# Patient Record
Sex: Female | Born: 1995 | Race: Black or African American | Hispanic: No | Marital: Single | Smoking: Never smoker
Health system: Southern US, Community
[De-identification: ages and names within clinical notes are randomized; demographics above are authoritative.]

## PROBLEM LIST (undated history)

## (undated) DIAGNOSIS — O24419 Gestational diabetes mellitus in pregnancy, unspecified control: Secondary | ICD-10-CM

## (undated) DIAGNOSIS — R519 Headache, unspecified: Secondary | ICD-10-CM

## (undated) DIAGNOSIS — J45909 Unspecified asthma, uncomplicated: Secondary | ICD-10-CM

## (undated) DIAGNOSIS — O139 Gestational [pregnancy-induced] hypertension without significant proteinuria, unspecified trimester: Secondary | ICD-10-CM

## (undated) DIAGNOSIS — F419 Anxiety disorder, unspecified: Secondary | ICD-10-CM

## (undated) DIAGNOSIS — L719 Rosacea, unspecified: Secondary | ICD-10-CM

## (undated) DIAGNOSIS — E282 Polycystic ovarian syndrome: Secondary | ICD-10-CM

## (undated) DIAGNOSIS — E119 Type 2 diabetes mellitus without complications: Secondary | ICD-10-CM

## (undated) HISTORY — DX: Headache, unspecified: R51.9

## (undated) HISTORY — DX: Anxiety disorder, unspecified: F41.9

## (undated) HISTORY — DX: Gestational diabetes mellitus in pregnancy, unspecified control: O24.419

## (undated) HISTORY — PX: DRUG INDUCED ENDOSCOPY: SHX6808

---
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
## 2014-03-07 ENCOUNTER — Inpatient Hospital Stay: Admit: 2014-03-07 | Discharge: 2014-03-07 | Disposition: A | Discharge status: Home / Self Care

## 2014-03-07 ENCOUNTER — Emergency Department: Admit: 2014-03-07

## 2014-03-07 DIAGNOSIS — N10 Acute tubulo-interstitial nephritis: Secondary | ICD-10-CM

## 2014-03-07 LAB — CBC WITH AUTO DIFFERENTIAL
Basophils Absolute: 0 10*3/uL (ref 0.0–0.1)
Basophils: 0.4 %
Eosinophils Absolute: 0.2 10*3/uL (ref 0.0–0.4)
Eosinophils: 1.4 %
Hematocrit: 38 % (ref 37.0–47.0)
Hemoglobin: 11.9 gm/dl — ABNORMAL LOW (ref 12.0–16.0)
Lymphocytes Absolute: 2.8 10*3/uL (ref 1.0–4.8)
Lymphocytes: 23.7 %
MCH: 25.6 pg — ABNORMAL LOW (ref 27.0–31.0)
MCHC: 31.3 gm/dl — ABNORMAL LOW (ref 33.0–37.0)
MCV: 81.6 fL (ref 81.0–99.0)
MPV: 7.5 mcm (ref 7.4–10.4)
Monocytes Absolute: 0.6 10*3/uL (ref 0.4–1.3)
Monocytes: 5.4 %
Platelets: 314 10*3/uL (ref 130–400)
RBC Morphology: NORMAL
RBC: 4.65 10*6/uL (ref 4.20–5.40)
RDW: 14.3 % (ref 11.5–14.5)
Seg Neutrophils: 69.1 %
Segs Absolute: 8.2 10*3/uL — ABNORMAL HIGH (ref 1.8–7.7)
WBC: 11.8 10*3/uL — ABNORMAL HIGH (ref 4.8–10.8)
nRBC: 0 /100 wbc

## 2014-03-07 LAB — URINE WITH REFLEXED MICRO
Bilirubin Urine: NEGATIVE
CASTS 2: NONE SEEN /lpf
Crystals, UA: NONE SEEN
Glucose, Ur: NEGATIVE mg/dl
Ketones, Urine: NEGATIVE
MISCELLANEOUS 2: NONE SEEN
Nitrite, Urine: POSITIVE — AB
Protein, UA: 100 — AB
Renal Epithelial, UA: NONE SEEN
Specific Gravity, Urine: 1.018 (ref 1.002–1.03)
Urobilinogen, Urine: 1 eu/dl (ref 0.0–1.0)
WBC, UA: >200 /hpf (ref 0–?)
Yeast, UA: NONE SEEN
pH, UA: 6 (ref 5.0–9.0)

## 2014-03-07 LAB — BASIC METABOLIC PANEL
BUN: 11 mg/dl (ref 7–22)
CO2: 24 meq/l (ref 23–33)
Calcium: 9.6 mg/dl (ref 8.5–10.5)
Chloride: 100 meq/l (ref 98–111)
Creatinine: 0.7 mg/dl (ref 0.4–1.2)
Glucose: 91 mg/dl (ref 70–108)
Potassium: 3.8 meq/l (ref 3.5–5.2)
Sodium: 139 meq/l (ref 135–145)

## 2014-03-07 LAB — OSMOLALITY: Osmolality Calc: 276.5 mOsmol/kg (ref 275.0–300)

## 2014-03-07 LAB — GLOMERULAR FILTRATION RATE, ESTIMATED: Est, Glom Filt Rate: >90 mL/min/{1.73_m2}

## 2014-03-07 LAB — PREGNANCY, URINE: Pregnancy, Urine: NEGATIVE

## 2014-03-07 LAB — ANION GAP: Anion Gap: 18.8 (ref 10.0–20.0)

## 2014-03-07 MED ORDER — ONDANSETRON 4 MG PO TBDP
4 MG | ORAL | Status: DC
Start: 2014-03-07 — End: 2014-03-07

## 2014-03-07 MED ORDER — CIPROFLOXACIN HCL 500 MG PO TABS
500 MG | ORAL_TABLET | Freq: Two times a day (BID) | ORAL | Status: AC
Start: 2014-03-07 — End: 2014-03-14

## 2014-03-07 MED ORDER — HYDROCODONE-ACETAMINOPHEN 5-325 MG PO TABS
5-325 MG | ORAL_TABLET | Freq: Four times a day (QID) | ORAL | Status: DC | PRN
Start: 2014-03-07 — End: 2014-05-07

## 2014-03-07 MED ORDER — KETOROLAC TROMETHAMINE 10 MG PO TABS
10 MG | ORAL_TABLET | Freq: Four times a day (QID) | ORAL | Status: DC | PRN
Start: 2014-03-07 — End: 2014-05-07

## 2014-03-07 MED ORDER — KETOROLAC TROMETHAMINE 60 MG/2ML IJ SOLN
60 MG/2ML | INTRAMUSCULAR | Status: DC
Start: 2014-03-07 — End: 2014-03-07

## 2014-03-07 MED ORDER — ONDANSETRON 4 MG PO TBDP
4 MG | Freq: Once | ORAL | Status: AC
Start: 2014-03-07 — End: 2014-03-07
  Administered 2014-03-07: 07:00:00 4 mg via ORAL

## 2014-03-07 MED ORDER — KETOROLAC TROMETHAMINE 60 MG/2ML IJ SOLN
60 MG/2ML | Freq: Once | INTRAMUSCULAR | Status: AC
Start: 2014-03-07 — End: 2014-03-07
  Administered 2014-03-07: 07:00:00 60 mg via INTRAMUSCULAR

## 2014-03-07 MED FILL — KETOROLAC TROMETHAMINE 60 MG/2ML IJ SOLN: 60 MG/2ML | INTRAMUSCULAR | Qty: 2

## 2014-03-07 MED FILL — ONDANSETRON 4 MG PO TBDP: 4 MG | ORAL | Qty: 1

## 2014-03-07 NOTE — Discharge Instructions (Signed)
Kidney Infection: Care Instructions  Your Care Instructions  A kidney infection (pyelonephritis) is a type of urinary tract infection, or UTI. Most UTIs are bladder infections. Kidney infections tend to make people much sicker than bladder infections do. A kidney infection is also more serious because it can cause lasting damage if it is not treated quickly.  Follow-up care is a key part of your treatment and safety. Be sure to make and go to all appointments, and call your doctor if you are having problems. It's also a good idea to know your test results and keep a list of the medicines you take.  How can you care for yourself at home?   Take your antibiotics as directed. Do not stop taking them just because you feel better. You need to take the full course of antibiotics.   Drink plenty of water, enough so that your urine is light yellow or clear like water. This may help wash out bacteria that are causing the infection. If you have kidney, heart, or liver disease and have to limit fluids, talk with your doctor before you increase the amount of fluids you drink.   Urinate often. Try to empty your bladder each time.   To relieve pain, take a hot shower or lay a heating pad (set on low) over your lower belly. Never go to sleep with a heating pad in place. Put a thin cloth between the heating pad and your skin.  To help prevent kidney infections    Drink plenty of water each day. This helps you urinate often, which clears bacteria from your system. If you have kidney, heart, or liver disease and have to limit fluids, talk with your doctor before you increase the amount of fluids you drink.   Include cranberry juice in your diet.   Urinate when you have the urge. Do not hold your urine for a long time. Urinate before you go to sleep.   If you have symptoms of a bladder infection, such as burning when you urinate or having to urinate often, call your doctor so you can treat the problem before it gets worse.  If you do not treat a bladder infection quickly, it can spread to the kidney.   Men should keep the tip of the penis clean.  If you are a woman, keep these ideas in mind:   Urinate right after you have sex.   Change sanitary pads often. Avoid douches, feminine hygiene sprays, and other feminine hygiene products that have deodorants.   After going to the bathroom, wipe from front to back.  When should you call for help?  Call your doctor now or seek immediate medical care if:   You have increasing pain in your back just below the rib cage. This is called flank pain.   You have a new or higher fever and chills.   You are vomiting or nauseated.  Watch closely for changes in your health, and be sure to contact your doctor if:   Symptoms, such as burning when you urinate, get worse or get better but then come back.   You are not getting better after 2 days.   Where can you learn more?   Go to [email or web address] and sign in to your MyChart account. Enter E877 in the Search Health Information box to learn more about "Kidney Infection: Care Instructions."    If you do not have an account, please click on the "Sign Up Now" link.        2006-2015 [employer]. Care instructions adapted under license by [facility]. This care instruction is for use with your licensed healthcare professional. If you have questions about a medical condition or this instruction, always ask your healthcare professional. [employer] disclaims any warranty or liability for your use of this information.  Content Version: 10.6.465758; Current as of: February 27, 2013

## 2014-03-07 NOTE — ED Notes (Signed)
Patient to ED for dysuria. Patient states [place] was [place] in [place] and came to [place] a couple days ago. Patient reports that symptoms have not improved since being on antibiotics. Patient can not recall what antibiotic she was on and left the prescription at home. Patient states that pain is now in her abdomin radiating to her right flank. Last time patient has this pain she was diagnosed with a kidney infection.    Doree Albeeanner D Norman Piacentini, RN  03/07/14 [phone]

## 2014-03-07 NOTE — ED Provider Notes (Signed)
[facility]  eMERGENCY dEPARTMENT eNCOUnter          CHIEF COMPLAINT       Chief Complaint   Patient presents with   ??? Dysuria   ??? Flank Pain     right       Nurses Notes were reviewed and I agree except as noted in the HPI.      HISTORY OF PRESENT ILLNESS    Yvonne Vang is a 18 y.o. female who presents with dysuria and right flank pain.  The patient states that she began to develop dysuria and right flank pain 2 days ago, no known trigger or injury.  Patient states the pain as a burning sensation when she urinates, with mild flank pain that has been present over the past 24 hours.  Patient states that she is largely asymptomatic when she is not urinating.  The patient denies nausea, vomiting, fevers, chills, rashes, pharyngitis, lightheadedness, dizziness, or episodes of syncope.  The patient denies vaginal discharge, vaginal bleeding, changes in bowel movements.  She attempted to treat her symptoms with an over-the-counter medication for urinary tract infections, and stated symptoms initially resolved, but have worsened.  She denies any additional symptoms at this point.     Significant past medical history: None    Onset: 2 days ago, with no known trigger or injury  Location: Lower abdomen, right flank  Duration: Constant  Character: Burning, dull ache in flank  Aggravating/Relieving Factors: Unable to describe aggravating or relieving symptoms  Previous Treatment: Over-the-counter urinary tract infection medication  Associated Symptoms: None     REVIEW OF SYSTEMS     Review of Systems   Constitutional: Negative for fever, chills and fatigue.   HENT: Negative for congestion, ear pain, postnasal drip, rhinorrhea and sore throat.    Eyes: Negative for pain and visual disturbance.   Respiratory: Negative for cough, chest tightness, shortness of breath and wheezing.    Cardiovascular: Negative for chest pain, palpitations and leg swelling.   Gastrointestinal: Negative for nausea, vomiting,  abdominal pain, diarrhea and constipation.   Genitourinary: Positive for dysuria, flank pain and pelvic pain. Negative for frequency, hematuria, decreased urine volume, vaginal bleeding, vaginal discharge and vaginal pain.   Musculoskeletal: Negative for joint swelling, arthralgias and gait problem.   Skin: Negative for rash and wound.   Allergic/Immunologic: Negative for immunocompromised state.   Neurological: Negative for dizziness, syncope, weakness, light-headedness, numbness and headaches.   Hematological: Negative for adenopathy.   Psychiatric/Behavioral: Negative for confusion.        PAST MEDICAL HISTORY    has a past medical history of Pneumonia.    SURGICAL HISTORY      has no past surgical history on file.    CURRENT MEDICATIONS       Discharge Medication List as of 03/07/2014  4:21 AM          ALLERGIES     has No Known Allergies.    FAMILY HISTORY     has no family status information on file.  family history is not on file.    SOCIAL HISTORY      reports that she has never smoked. She has never used smokeless tobacco. She reports that she does not drink alcohol or use illicit drugs.    PHYSICAL EXAM     INITIAL VITALS:  oral temperature is 97.9 ??F (36.6 ??C). Her blood pressure is 127/71 and her pulse is 94. Her respiration is 20 and oxygen  saturation is 98%.    Physical Exam   Constitutional: She is oriented to person, place, and time. She appears well-developed and well-nourished. No distress.   HENT:   Head: Normocephalic and atraumatic.   Right Ear: External ear normal.   Left Ear: External ear normal.   Nose: Nose normal.   Mouth/Throat: Uvula is midline, oropharynx is clear and moist and mucous membranes are normal. No oropharyngeal exudate, posterior oropharyngeal edema or posterior oropharyngeal erythema.   Eyes: Conjunctivae and lids are normal. Pupils are equal, round, and reactive to light. Right eye exhibits no discharge. Left eye exhibits no discharge.   Neck: Normal range of motion. Neck  supple. No tracheal deviation present.   Cardiovascular: Normal rate, regular rhythm, normal heart sounds and intact distal pulses.  Exam reveals no gallop and no friction rub.    No murmur heard.  Pulses:       Radial pulses are 2+ on the right side, and 2+ on the left side.   Pulmonary/Chest: Effort normal and breath sounds normal. No accessory muscle usage. No tachypnea. No respiratory distress. She has no decreased breath sounds. She has no wheezes. She has no rhonchi. She has no rales.   Abdominal: Soft. Bowel sounds are normal. She exhibits no distension. There is no hepatosplenomegaly. There is tenderness (mild) in the suprapubic area. There is no rigidity, no rebound, no guarding and no CVA tenderness. No hernia.   Musculoskeletal: Normal range of motion. She exhibits no edema or tenderness.   Lymphadenopathy:     She has no cervical adenopathy.   Neurological: She is alert and oriented to person, place, and time. She displays no tremor. No cranial nerve deficit or sensory deficit. She exhibits normal muscle tone. She displays no seizure activity. Coordination and gait normal. GCS eye subscore is 4. GCS verbal subscore is 5. GCS motor subscore is 6.   Skin: Skin is warm and dry. No rash noted. No erythema.   Psychiatric: She has a normal mood and affect. Her speech is normal and behavior is normal. Judgment and thought content normal. Cognition and memory are normal.   Nursing note and vitals reviewed.      DIFFERENTIAL DIAGNOSIS:   UTI, pyelonephritis, nephrolithiasis, cystitis, pregnancy    DIAGNOSTIC RESULTS     EKG: All EKG's are interpreted by the Emergency Department Physician who either signs or Co-signs this chart in the absence of a cardiologist.  None    RADIOLOGY: non-plain film images(s) such as CT, Ultrasound and MRI are read by the radiologist.  CT ABDOMEN PELVIS WO IV CONTRAST    (Results Pending)   FINDINGS:    Visualized lung bases are clear. No free air seen.    Uppermost portion of the  liver not included on the study. Visualized portions appear normal.    Normal-appearing biliary tree and gallbladder.    Uppermost portion of the spleen not included on the study. Visualized portions appear normal.    Adrenal glands show no significant abnormality.    Left kidney shows no significant abnormality.    Right kidney shows mild hydronephrosis and perinephric stranding is mild.Pancreas shows no significant abnormality.    Abdominal aorta is non-aneurysmal.    No bowel obstruction identified.    Normal-appearing appendix. No right lower quadrant inflammatory changes.    Minor inflammation around the bladder. No ureteral calculus seen on either side.    Ovaries appear grossly unremarkable.    Impression:    1. Probable pyelonephritis on  the right given some stranding around the bladder. Alternatively, recently passed calculus could have this appearance.      LABS:   Results for orders placed during the hospital encounter of 03/07/14   CBC WITH AUTO DIFFERENTIAL       Result Value Ref Range    WBC 11.8 (*) 4.8 - 10.8 thou/mm3    RBC 4.65  4.20 - 5.40 mill/mm3    Hemoglobin 11.9 (*) 12.0 - 16.0 gm/dl    Hematocrit 16.1  09.6 - 47.0 %    MCV 81.6  81.0 - 99.0 fL    MCH 25.6 (*) 27.0 - 31.0 pg    MCHC 31.3 (*) 33.0 - 37.0 gm/dl    RDW 04.5  40.9 - 81.1 %    Platelets 314  130 - 400 thou/mm3    MPV 7.5  7.4 - 10.4 mcm    RBC Morphology NORMAL      Seg Neutrophils 69.1      Lymphocytes 23.7      Monocytes 5.4      Eosinophils 1.4      Basophils 0.4      nRBC 0      Hypochromia 1+      Segs Absolute 8.2 (*) 1.8 - 7.7 thou/mm3    Lymphocytes Absolute 2.8  1.0 - 4.8 thou/mm3    Monocytes Absolute 0.6  0.4 - 1.3 thou/mm3    Eosinophils Absolute 0.2  0.0 - 0.4 thou/mm3    Basophils Absolute 0.0  0.0 - 0.1 thou/mm3   BASIC METABOLIC PANEL       Result Value Ref Range    Sodium 139  135 - 145 meq/l    Potassium 3.8  3.5 - 5.2 meq/l    Chloride 100  98 - 111 meq/l    CO2 24  23 - 33 meq/l    Glucose 91  70 - 108 mg/dl     BUN 11  7 - 22 mg/dl    CREATININE 0.7  0.4 - 1.2 mg/dl    Calcium 9.6  8.5 - 91.4 mg/dl   PREGNANCY, URINE       Result Value Ref Range    Pregnancy, Urine NEGATIVE  NEGATIVE   GLOMERULAR FILTRATION RATE, ESTIMATED       Result Value Ref Range    Est, Glom Filt Rate > 90     OSMOLALITY       Result Value Ref Range    Osmolality Calc 276.5  275.0 - 300 mOsmol/kg   ANION GAP       Result Value Ref Range    Anion Gap 18.8  10.0 - 20.0   URINE WITH REFLEXED MICRO       Result Value Ref Range    Glucose, Ur NEGATIVE  NEGATIVE mg/dl    Bilirubin Urine NEGATIVE  NEGATIVE    Ketones, Urine NEGATIVE  NEGATIVE    Specific Gravity, Urine 1.018  1.002 - 1.03    Blood, Urine LARGE (*) NEGATIVE    pH, UA 6.0  5.0-9.0    Protein, UA 100 (*) NEGATIVE    Urobilinogen, Urine 1.0  0.0-1.0 eu/dl    Nitrite, Urine POSITIVE (*) NEGATIVE    Leukocyte Esterase, Urine LARGE (*) NEGATIVE    Color, UA ORANGE  STRAW-YELL    Character, Urine CLOUDY (*) CLEAR-SL C    RBC, UA 5-10  0-2/hpf /hpf    WBC, UA > 200  0-4/hpf /hpf    Epi Cells 0-2  3-5/hpf /hpf    Bacteria, UA MANY  FEW/NONE S /hpf    Casts UA 4-8 Hyaline  NONE SEEN /lpf    Crystals NONE SEEN  NONE SEEN    Renal Epithelial, Urine NONE SEEN  NONE SEEN    Yeast, UA NONE SEEN  NONE SEEN    CASTS 2 NONE SEEN  NONE SEEN /lpf    MISCELLANEOUS 2 NONE SEEN         EMERGENCY DEPARTMENT COURSE:   Vitals:    Filed Vitals:    03/07/14 0042   BP: 127/71   Pulse: 94   Temp: 97.9 ??F (36.6 ??C)   TempSrc: Oral   Resp: 20   SpO2: 98%        The patient was seen, a thorough history was taken, and physical exam was performed.  During the stay in the Emergency Department, the patient's condition and vital signs remained stable.  Physical exam revealed mild suprapubic tenderness, no CVA tenderness during initial evaluation.  No other acute findings.  Laboratory studies demonstrated mild leukocytosis, and nitrite positive urinary tract infection.  During reevaluation, the patient became nauseous, and did  develop right flank pain.  At this point, the patient was offered a CT of the abdomen and pelvis without contrast for evaluation of possible nephrolithiasis, which she accepted.  Imaging was obtained, including CT abdomen and pelvis, which revealed probable pyelonephritis, with possible recently passed calculus.  While in the department, the patient was administered the medications listed below, with good relief of symptoms.  Given the findings above, it was determined that the patient was appropriate for discharge and could be followed on an outpatient setting. The results were discussed with the patient and family, and they were amenable to the proposed treatment plan.         Medications   ketorolac (TORADOL) injection 60 mg (60 mg Intramuscular Given 03/07/14 0218)   ondansetron (ZOFRAN-ODT) disintegrating tablet 4 mg (4 mg Oral Given 03/07/14 0225)       CRITICAL CARE:   None    CONSULTS:  None    PROCEDURES:  None    FINAL IMPRESSION      1. Pyelonephritis, acute          DISPOSITION/PLAN   DISPOSITION Decision To Discharge    The patient was advised to follow closely with the physicians listed below, with the prescriptions noted. The patient was discharged in stable condition, with instructions to return to the emergency department if the condition worsened.    PATIENT REFERRED TO:  [facility]  [address]  [place] [address]  [phone]  Call in 2 days  To establish a primary care physician    [facility]  [address] [place] [address]  [phone]  Call in 2 days  To establish a primary care physician      DISCHARGE MEDICATIONS:  Discharge Medication List as of 03/07/2014  4:21 AM      START taking these medications    Details   ketorolac (TORADOL) 10 MG tablet Take 1 tablet by mouth every 6 hours as needed for Pain, Disp-16 tablet, R-0      ciprofloxacin (CIPRO) 500 MG tablet Take 1 tablet by mouth 2 times daily for 7 days, Disp-14 tablet, R-0      HYDROcodone-acetaminophen  (NORCO) 5-325 MG per tablet Take 1 tablet by mouth every 6 hours as needed for Pain, Disp-15 tablet, R-0             (  Please note that portions of this note were completed with a voice recognition program.  Efforts were made to edit the dictations but occasionally words are mis-transcribed.)    Carin Hock, PA    This patient was seen independently by Carin Hock, PA-C a Mid-Level Provider in the [facility] Emergency Department.  The patient was given an opportunity to see the Emergency Attending. The patient voiced understanding that I was a Corporate treasurer and was in agreement with being seen independently by myself.        Carin Hock, [place]  03/07/14 [phone]

## 2014-03-09 LAB — URINE CULTURE, REFLEXED

## 2014-04-30 ENCOUNTER — Inpatient Hospital Stay
Admit: 2014-04-30 | Discharge: 2014-04-30 | Disposition: A | Discharge status: Home / Self Care | Attending: Family Medicine

## 2014-04-30 DIAGNOSIS — O219 Vomiting of pregnancy, unspecified: Secondary | ICD-10-CM

## 2014-04-30 LAB — CBC WITH AUTO DIFFERENTIAL
Basophils Absolute: 0 10*3/uL (ref 0.0–0.1)
Basophils: 0.3 %
Eosinophils Absolute: 0 10*3/uL (ref 0.0–0.4)
Eosinophils: 0.3 %
Hematocrit: 40.5 % (ref 37.0–47.0)
Hemoglobin: 12.6 gm/dl (ref 12.0–16.0)
Lymphocytes Absolute: 2 10*3/uL (ref 1.0–4.8)
Lymphocytes: 17.5 %
MCH: 25.2 pg — ABNORMAL LOW (ref 27.0–31.0)
MCHC: 31 gm/dl — ABNORMAL LOW (ref 33.0–37.0)
MCV: 81.2 fL (ref 81.0–99.0)
MPV: 7.4 mcm (ref 7.4–10.4)
Monocytes Absolute: 0.8 10*3/uL (ref 0.4–1.3)
Monocytes: 6.8 %
Platelets: 303 10*3/uL (ref 130–400)
RBC Morphology: NORMAL
RBC: 4.98 10*6/uL (ref 4.20–5.40)
RDW: 14.2 % (ref 11.5–14.5)
Seg Neutrophils: 75.1 %
Segs Absolute: 8.6 10*3/uL — ABNORMAL HIGH (ref 1.8–7.7)
WBC: 11.4 10*3/uL — ABNORMAL HIGH (ref 4.8–10.8)
nRBC: 0 /100 wbc

## 2014-04-30 LAB — MICROSCOPIC URINALYSIS
Blood, Urine: NEGATIVE
Casts: NONE SEEN /lpf
Crystals: NONE SEEN
Glucose, Urine: NEGATIVE mg/dl
Ketones, Urine: >=160
Miscellaneous Lab Test Result: NONE SEEN
Nitrite, Urine: NEGATIVE
Protein, UA: 100 mg/dl — AB
Renal Epithelial, UA: NONE SEEN
Specific Gravity, UA: >1.03 — AB (ref 1.002–1.03)
Urobilinogen, Urine: 0.2 eu/dl (ref 0.0–1.0)
Yeast, UA: NONE SEEN
pH, UA: 6 (ref 5.0–9.0)

## 2014-04-30 LAB — HEPATIC FUNCTION PANEL
ALT: 10 U/L — ABNORMAL LOW (ref 11–66)
AST: 13 U/L (ref 5–40)
Albumin: 4 gm/dl (ref 3.5–5.1)
Alkaline Phosphatase: 79 U/L (ref 38–126)
Bilirubin, Direct: <0.2 mg/dl (ref 0.0–0.3)
Total Bilirubin: 0.3 mg/dl (ref 0.3–1.2)
Total Protein: 8.4 gm/dl — ABNORMAL HIGH (ref 6.1–8.0)

## 2014-04-30 LAB — BASIC METABOLIC PANEL
BUN: 5 mg/dl — ABNORMAL LOW (ref 7–22)
CO2: 18 meq/l — ABNORMAL LOW (ref 23–33)
Calcium: 9.6 mg/dl (ref 8.5–10.5)
Chloride: 97 meq/l — ABNORMAL LOW (ref 98–111)
Creatinine: 0.7 mg/dl (ref 0.4–1.2)
Glucose: 84 mg/dl (ref 70–108)
Potassium: 3.7 meq/l (ref 3.5–5.2)
Sodium: 138 meq/l (ref 135–145)

## 2014-04-30 LAB — OSMOLALITY: Osmolality Calc: 272.1 mOsmol/kg — ABNORMAL LOW (ref 275.0–300)

## 2014-04-30 LAB — GLOMERULAR FILTRATION RATE, ESTIMATED: Est, Glom Filt Rate: >90 mL/min/{1.73_m2}

## 2014-04-30 LAB — LIPASE: Lipase: 19.9 U/L (ref 5.6–51.3)

## 2014-04-30 LAB — ICTOTEST, URINE: Ictotest: NEGATIVE

## 2014-04-30 LAB — HCG, QUANTITATIVE, PREGNANCY: hCG,Beta Subunit,Qual,Serum: 69679 m[IU]/mL — ABNORMAL HIGH (ref 0.0–5.0)

## 2014-04-30 LAB — ANION GAP: Anion Gap: 26.7 — ABNORMAL HIGH (ref 10.0–20.0)

## 2014-04-30 MED ORDER — FAMOTIDINE 20 MG/2ML IV SOLN
20 MG/2ML | Freq: Once | INTRAVENOUS | Status: AC
Start: 2014-04-30 — End: 2014-04-30
  Administered 2014-04-30: 17:00:00 20 mg via INTRAVENOUS

## 2014-04-30 MED ORDER — METOCLOPRAMIDE HCL 5 MG/ML IJ SOLN
5 MG/ML | Freq: Once | INTRAMUSCULAR | Status: AC
Start: 2014-04-30 — End: 2014-04-30
  Administered 2014-04-30: 17:00:00 10 mg via INTRAVENOUS

## 2014-04-30 MED ORDER — SODIUM CHLORIDE 0.9 % IV BOLUS
0.9 % | Freq: Once | INTRAVENOUS | Status: AC
Start: 2014-04-30 — End: 2014-04-30
  Administered 2014-04-30: 17:00:00 500 mL via INTRAVENOUS

## 2014-04-30 MED ORDER — SODIUM CHLORIDE 0.9 % IV BOLUS
0.9 % | Freq: Once | INTRAVENOUS | Status: AC
Start: 2014-04-30 — End: 2014-04-30
  Administered 2014-04-30: 19:00:00 500 mL via INTRAVENOUS

## 2014-04-30 MED ORDER — METOCLOPRAMIDE HCL 10 MG PO TABS
10 MG | ORAL_TABLET | Freq: Three times a day (TID) | ORAL | Status: AC | PRN
Start: 2014-04-30 — End: ?

## 2014-04-30 MED FILL — METOCLOPRAMIDE HCL 5 MG/ML IJ SOLN: 5 MG/ML | INTRAMUSCULAR | Qty: 2

## 2014-04-30 MED FILL — FAMOTIDINE 20 MG/2ML IV SOLN: 20 MG/2ML | INTRAVENOUS | Qty: 2

## 2014-04-30 NOTE — ED Notes (Signed)
Patient in ed room 12.   Patient complain of nausea and vomiting, assessment completed.    Velvet BatheKimberly Tasheba Henson, RN  04/30/14 [phone]

## 2014-04-30 NOTE — ED Provider Notes (Addendum)
[facility]  eMERGENCY dEPARTMENT eNCOUnter   Physician Attestation    Pt Name: Belva Chimesaliyah C Dufner  MRN: [id number]  Birthdate May 04, 1995  Date of evaluation: 04/30/14        Physician Note:    I have personally performed and/or participated in the history, exam and medical decision making and agree with all pertinent clinical information.  I have also reviewed and agree with the past medical, family and social history unless otherwise noted.    I have personally performed a face to face diagnostic evaluation on this patient. I have reviewed the mid-level???s findings and agree.      Evaluation:  This 19 year old black female was seen by myself as well as the ED fellow.  She presents with persistent vomiting and inability to hold fluids well, she is pregnant and is approximately  [redacted] weeks along according to her dates.  Patient seen and examined by myself she appears comfortable mouth is moist abdomen is soft and obese and nontender.  She is not tachycardic at this time.  She is receiving 1500 mL of normal saline, and is markedly improved.  We did discuss this with the on-call gynecologist/obstetrician who will follow patient up next week.  Specifically she was agreeable to Reglan as an anti-emetic in dosing given, and out patient follow up.She is instructed to return if inability to hold down fluids.  Labs were Pertinent for ketones greater than 160 specific gravity 1.030 and CO2 of 18.    1. Vomiting of pregnancy, antepartum          DISPOSITION/PLAN  PATIENT REFERRED TO:  Wynona Doveiana E Barbu, MD  [address]  [place] [id number]  [phone]    Call in 1 day  If symptoms worsen    DISCHARGE MEDICATIONS:  New Prescriptions    METOCLOPRAMIDE (REGLAN) 10 MG TABLET    Take 1 tablet by mouth 3 times daily as needed WARNING:  May cause drowsiness.  May impair ability to operate vehicles or machinery.  Do not use in combination with alcohol.         Ronnald CollumMary Remo Kirschenmann, MD          Ronnald CollumMary Maryori Weide, MD  04/30/14  1425    Ronnald CollumMary Shaketa Serafin, MD  04/30/14 [phone]

## 2014-04-30 NOTE — ED Notes (Signed)
Patient drinking apple juice    Velvet BatheKimberly Lasean Gorniak, RN  04/30/14 1328

## 2014-04-30 NOTE — ED Provider Notes (Signed)
[facility]  eMERGENCY dEPARTMENT eNCOUnter          CHIEF COMPLAINT       Chief Complaint   Patient presents with   ??? Emesis During Pregnancy   ??? Pharyngitis   ??? Otitis Media     left       Nurses Notes reviewed and I agree except as noted in the HPI.    HISTORY OF PRESENT ILLNESS    Yvonne Vang is a 19 y.o. female who presents to the Emergency Department for the evaluation of nausea and vomiting. Patient reports that she is currently 7-[redacted] weeks pregnant. She states that this is her first pregnancy, and has never been pregnant before. Patient reports that for the past two days she has been having nausea with frequent episodes of vomiting. She states that she has been unable to keep any food or fluids down without vomiting. Patient states that she is having associated abdominal cramping at a 6/10 in severity. She denies any radiation of pain, or any modifying factors to her pain. She denies dysuria, hematuria, vaginal bleeding or discharge.       The HPI was provided by the patient.     REVIEW OF SYSTEMS     Review of Systems   Constitutional: Negative for fever, chills and appetite change.   HENT: Negative for congestion, ear pain and sore throat.    Eyes: Negative for discharge, redness and visual disturbance.   Respiratory: Negative for cough, chest tightness and shortness of breath.    Cardiovascular: Negative for chest pain, palpitations and leg swelling.   Gastrointestinal: Positive for nausea, vomiting and abdominal pain. Negative for diarrhea.   Genitourinary: Negative for dysuria, decreased urine volume and difficulty urinating.   Musculoskeletal: Negative for back pain, arthralgias and neck pain.   Skin: Negative for rash and wound.   Neurological: Negative for dizziness, syncope, weakness and headaches.   Psychiatric/Behavioral: Negative for confusion and sleep disturbance.       PAST MEDICAL HISTORY    has a past medical history of Pneumonia.    SURGICAL HISTORY      has no past  surgical history on file.    CURRENT MEDICATIONS       Previous Medications    HYDROCODONE-ACETAMINOPHEN (NORCO) 5-325 MG PER TABLET    Take 1 tablet by mouth every 6 hours as needed for Pain    KETOROLAC (TORADOL) 10 MG TABLET    Take 1 tablet by mouth every 6 hours as needed for Pain       ALLERGIES     has No Known Allergies.    FAMILY HISTORY     has no family status information on file.  family history is not on file.    SOCIAL HISTORY      reports that she has never smoked. She has never used smokeless tobacco. She reports that she does not drink alcohol or use illicit drugs.    PHYSICAL EXAM     INITIAL VITALS:  oral temperature is 99.3 ??F (37.4 ??C). Her blood pressure is 129/72 and her pulse is 103. Her respiration is 16 and oxygen saturation is 97%.    Physical Exam   Constitutional: She is oriented to person, place, and time. She appears well-developed and well-nourished.   HENT:   Head: Normocephalic and atraumatic.   Right Ear: Tympanic membrane and external ear normal.   Left Ear: Tympanic membrane and external ear normal.   Nose: Nose  normal.   Mouth/Throat: Oropharynx is clear and moist and mucous membranes are normal.   Eyes: Right eye exhibits no discharge. Left eye exhibits no discharge. No scleral icterus.   Neck: Normal range of motion. No JVD present. No tracheal deviation present. No thyromegaly present.   Cardiovascular: Normal rate and regular rhythm.  Exam reveals no gallop and no friction rub.    No murmur heard.  Pulmonary/Chest: Effort normal and breath sounds normal. No stridor. No respiratory distress. She has no decreased breath sounds. She has no wheezes. She has no rhonchi. She has no rales.   Abdominal: Soft. She exhibits no distension. There is no tenderness. There is no rebound and no guarding.   Musculoskeletal: She exhibits no edema or tenderness.   Neurological: She is alert and oriented to person, place, and time. She exhibits normal muscle tone. GCS eye subscore is 4. GCS  verbal subscore is 5. GCS motor subscore is 6.   Skin: Skin is warm and dry. No rash (On exposed body surfaces) noted. She is not diaphoretic.   Psychiatric: She has a normal mood and affect. Her behavior is normal. Thought content normal.       DIFFERENTIAL DIAGNOSIS:   Hyperemesis gravidarum, metabolic disorder, threatened miscarriage.     DIAGNOSTIC RESULTS     EKG: All EKG's are interpreted by the Emergency Department Physician who either signs or Co-signs this chart in the absence of a cardiologist.  None    RADIOLOGY: non-plain film images(s) such as CT, Ultrasound and MRI are read by the radiologist.  None    LABS:   Labs Reviewed   CBC WITH AUTO DIFFERENTIAL - Abnormal; Notable for the following:     WBC 11.4 (*)     MCH 25.2 (*)     MCHC 31.0 (*)     Segs Absolute 8.6 (*)     All other components within normal limits   BASIC METABOLIC PANEL - Abnormal; Notable for the following:     Chloride 97 (*)     CO2 18 (*)     BUN 5 (*)     All other components within normal limits   HEPATIC FUNCTION PANEL - Abnormal; Notable for the following:     ALT 10 (*)     Total Protein 8.4 (*)     All other components within normal limits   HCG, QUANTITATIVE, PREGNANCY - Abnormal; Notable for the following:     hCG,Beta Subunit,Qual,Serum 69679.0 (*)     All other components within normal limits   MICROSCOPIC URINALYSIS - Abnormal; Notable for the following:     Bilirubin Urine SMALL (*)     Specific Gravity, UA > 1.030 (*)     Protein, UA 100 (*)     Leukocytes, UA MODERATE (*)     Color, UA DK YELLOW (*)     Character, Urine CLOUDY (*)     All other components within normal limits   OSMOLALITY - Abnormal; Notable for the following:     Osmolality Calc 272.1 (*)     All other components within normal limits   ANION GAP - Abnormal; Notable for the following:     Anion Gap 26.7 (*)     All other components within normal limits   LIPASE   ICTOTEST, URINE   GLOMERULAR FILTRATION RATE, ESTIMATED       EMERGENCY DEPARTMENT COURSE:    Vitals:    Filed Vitals:    04/30/14 1137  BP: 129/72   Pulse: 103   Temp: 99.3 ??F (37.4 ??C)   TempSrc: Oral   Resp: 16   SpO2: 97%     11:52 AM: The patient was seen and evaluated.     2:16 PM Discussed results, diagnosis and plan with patient and/or family.  Questions addressed.  Disposition and follow-up agreed upon.  Specific discharge instructions explained, including reasons to return to the emergency department.    The pt was seen and evaluated within the ED today following emesis. History and physical exam were obtained. Within the department, I observed the pt's vital signs to be within acceptable range. Laboratory work was reassuring. Within the department, the pt was treated with IV fluids, reglan, and pepcid. I observed the pt's condition to improve during the duration of their stay. I explained my proposed course of treatment to the patient, and they were amenable to my decision. They were discharged home, and they will return to the ED if their symptoms become more severe in nature, or otherwise change. Patient was not offered vaginal exam secondary to no vaginal bleeding or cramping consistent with early miscarriage. Urine was without evidence of infection. Blood pressure was normal during course of care. No evidence of protein noted on urine. No evidence of leg edema.       CRITICAL CARE:   None     CONSULTS:  Dr. Kenton Kingfisher called during course of care. Patient to follow up for further evaluation.     PROCEDURES:  None    FINAL IMPRESSION      1. Vomiting of pregnancy, antepartum          DISPOSITION/PLAN   Patient was discharged in stable condition. Will return if symptoms change or worsen, or for any sign or symptom deemed emergent by the patient or family members. Follow up as an outpatient, sooner if symptoms warrant.     PATIENT REFERRED TO:  Wynona Dove, MD  [address]  [place] [address]  [phone]    Call in 1 day  If symptoms worsen      DISCHARGE MEDICATIONS:  New Prescriptions     METOCLOPRAMIDE (REGLAN) 10 MG TABLET    Take 1 tablet by mouth 3 times daily as needed WARNING:  May cause drowsiness.  May impair ability to operate vehicles or machinery.  Do not use in combination with alcohol.       (Please note that portions of this note were completed with a voice recognition program.  Efforts were made to edit the dictations but occasionally words are mis-transcribed.)    Scribe:  Lenard Simmer 04/30/2014 11:52 AM Scribing for and in the presence of Stacie Glaze, MD.    Provider:  I personally performed the services described in the documentation, reviewed and edited the documentation which was dictated to the scribe in my presence, and it accurately records my words and actions.    Stacie Glaze, MD 04/30/2014 2:36 PM          Stacie Glaze, MD  05/03/14 1524

## 2014-05-07 ENCOUNTER — Inpatient Hospital Stay
Admit: 2014-05-07 | Discharge: 2014-05-08 | Disposition: A | Discharge status: Home / Self Care | Attending: Emergency Medicine

## 2014-05-07 DIAGNOSIS — O211 Hyperemesis gravidarum with metabolic disturbance: Secondary | ICD-10-CM

## 2014-05-07 NOTE — ED Notes (Signed)
Abdominal cramping and vomiting started yesterday.  [redacted] weeks pregnant.    Linus Makoandi Kjerstin Abrigo, RN  05/07/14 [phone]

## 2014-05-07 NOTE — Discharge Instructions (Signed)
Indigestion (Dyspepsia or Heartburn): Care Instructions  Your Care Instructions  Sometimes it can be hard to pinpoint the cause of indigestion (dyspepsia or heartburn). Most cases of an upset stomach with bloating, burning, burping, and nausea are minor and go away within several hours. Home treatment and over-the-counter medicine often are able to control symptoms. But if you take medicine to relieve your indigestion without making diet and lifestyle changes, your symptoms are likely to return again and again.  If you get indigestion often, it may be a sign of a more serious medical problem. Be sure to follow up with your doctor, who may want to do tests to be sure of the cause of your indigestion.  Follow-up care is a key part of your treatment and safety. Be sure to make and go to all appointments, and call your doctor if you are having problems. It's also a good idea to know your test results and keep a list of the medicines you take.  How can you care for yourself at home?   Your doctor may recommend over-the-counter medicine. For mild or occasional indigestion, antacids such as Tums, Gaviscon, Mylanta, or Maalox may help. Your doctor also may recommend over-the-counter acid reducers, such as Pepcid AC, Tagamet HB, Zantac 75, or Prilosec. Read and follow all instructions on the label. If you use these medicines often, talk with your doctor.   Change your eating habits.   It's best to eat several small meals instead of two or three large meals.   After you eat, wait 2 to 3 hours before you lie down.   Chocolate, mint, and alcohol can make GERD worse.   Spicy foods, foods that have a lot of acid (like tomatoes and oranges), and coffee can make GERD symptoms worse in some people. If your symptoms are worse after you eat a certain food, you may want to stop eating that food to see if your symptoms get better.   Do not smoke or chew tobacco. Smoking can make GERD worse. If you need help quitting, talk to  your doctor about stop-smoking programs and medicines. These can increase your chances of quitting for good.   If you have GERD symptoms at night, raise the head of your bed 6 to 8 inches by putting the frame on blocks or placing a foam wedge under the head of your mattress. (Adding extra pillows does not work.)   Do not wear tight clothing around your middle.   Lose weight if you need to. Losing just 5 to 10 pounds can help.   Do not take anti-inflammatory medicines, such as aspirin, ibuprofen (Advil, Motrin), or naproxen (Aleve). These can irritate the stomach. If you need a pain medicine, try acetaminophen (Tylenol), which does not cause stomach upset.  When should you call for help?  Call 911 anytime you think you may need emergency care. For example, call if:   You passed out (lost consciousness).   You vomit blood or what looks like coffee grounds.   You pass maroon or very bloody stools.   You have chest pain or pressure. This may occur with:   Sweating.   Shortness of breath.   Nausea or vomiting.   Pain that spreads from the chest to the neck, jaw, or one or both shoulders or arms.   Feeling dizzy or lightheaded.   A fast or uneven pulse.  After calling 911, chew 1 adult-strength aspirin. Wait for an ambulance. Do not try to drive yourself.    Call your doctor now or seek immediate medical care if:   You have severe belly pain.   Your stools are black and tarlike or have streaks of blood.   You have trouble swallowing.   You are losing weight and do not know why.  Watch closely for changes in your health, and be sure to contact your doctor if:   You do not get better as expected.   Where can you learn more?   Go to [email or web address] and sign in to your MyChart account. Enter [phone] in the Search Health Information box to learn more about "Indigestion (Dyspepsia or Heartburn): Care Instructions."    If you do not have an account, please click on the "Sign Up Now" link.       2006-2015 [employer]. Care instructions adapted under license by [facility]. This care instruction is for use with your licensed healthcare professional. If you have questions about a medical condition or this instruction, always ask your healthcare professional. [employer] disclaims any warranty or liability for your use of this information.  Content Version: 10.6.465758; Current as of: February 27, 2013                Extreme Nausea and Vomiting in Pregnancy: Care Instructions  Your Care Instructions  Nausea and vomiting (often called morning sickness) are common in pregnancy. They are caused by pregnancy hormones and happen most often in the first 3 months. Some women get very sick and are not able to keep down food and fluids. This extreme morning sickness is called hyperemesis gravidarum. It can lead to a dangerous loss of fluids in the body. It also can keep you from gaining weight and getting proper nutrition during your pregnancy.  Your body fluids are put back in balance with water and minerals called electrolytes. Medicine may help if you have severe nausea and vomiting.  Follow-up care is a key part of your treatment and safety. Be sure to make and go to all appointments, and call your doctor if you are having problems. It's also a good idea to know your test results and keep a list of the medicines you take.  How can you care for yourself at home?   Take your medicines exactly as prescribed. Call your doctor if you think you are having a problem with your medicine.   Drink plenty of fluids to prevent dehydration. Choose water and other caffeine-free clear liquids until you feel better. Try sipping on sports drinks that have salt and sugar in them.   Eat a small snack, such as crackers, before you get out of bed. Wait a few minutes, then get out of bed slowly.   Keep food in your stomach, but not too much at once. An empty stomach can make nausea worse. Eat several  small meals every day instead of three large meals.   Eat more protein and less fat.   Get plenty of vitamin B6 by eating whole grains, nuts, seeds, and legumes. You can take vitamin B6 tablets if your doctor says it is okay.   Try to avoid smells and foods that make you feel sick to your stomach.   Get lots of rest.   You may want to try acupressure bands. They put pressure on an acupressure point in the wrist. Some women feel better using the bands.   Ginger may also help you feel better. You can use it in tea, take it as a pill, or use a ginger  syrup that you can buy at a health food store.  When should you call for help?  Call 911 anytime you think you may need emergency care. For example, call if:   You passed out (lost consciousness).  Call your doctor now or seek immediate medical care if:   You vomit more than 3 times in a day, especially if you also have a fever or pain.   You are too sick to your stomach to drink any fluids.   You have signs of needing more fluids. You have sunken eyes and a dry mouth, and you pass only a little dark urine.   Your morning sickness gets worse or does not get better with home care.   You are not able to keep down your medicine.  Watch closely for changes in your health, and be sure to contact your doctor if you have any problems.   Where can you learn more?   Go to [email or web address] and sign in to your MyChart account. Enter [phone] in the Search Health Information box to learn more about "Extreme Nausea and Vomiting in Pregnancy: Care Instructions."    If you do not have an account, please click on the "Sign Up Now" link.      2006-2015 [employer]. Care instructions adapted under license by [facility]. This care instruction is for use with your licensed healthcare professional. If you have questions about a medical condition or this instruction, always ask your healthcare professional. [employer] disclaims any  warranty or liability for your use of this information.  Content Version: 10.6.465758; Current as of: Sep 04, 2013

## 2014-05-07 NOTE — ED Provider Notes (Signed)
[facility]  eMERGENCY dEPARTMENT eNCOUnter        CHIEF COMPLAINT    Chief Complaint   Patient presents with   ??? Emesis     [redacted] weeks pregnant   ??? Abdominal Cramping       Nurses Notes reviewed and I agree except as noted in the HPI.    HPI    Yvonne Vang is a 19 y.o. female who presents for evaluation of emesis.  7 days ago, the patient was seen in the ED for evaluation of nausea and she was prescribed Reglan.  She states that this medication had been controlling her symptoms well until yesterday.  1 day ago, the patient states that she became nauseated after eating and has had 10 episodes of emesis since this time.   She reports associated RUQ abdominal pain and heartburn, rating a 5/10 in severity, cramping in nature, and constant in duration.  The patient denies radiation of her pain, but reports that her pain is worse at night and often results in sleep disturbances.  She states that eating food does help to improve her pain.  The patient reports an associated low grade fever of 99.2, diarrhea, and coughing, but denies a sore throat.  Furthermore, the patient states that her last menstrual period ended on 03/07/14 and she is currently [redacted] weeks pregnant.  She denies associated vaginal discharge or vaginal bleeding.  The patient states that she does not currently have an OB/GYN.      REVIEW OF SYSTEMS    Review of Systems   Constitutional: Positive for fever (99.2). Negative for chills and unexpected weight change.   HENT: Negative for congestion, ear pain and sore throat.    Respiratory: Positive for cough. Negative for shortness of breath and wheezing.    Cardiovascular: Negative for chest pain, palpitations (No calf pain tenderness) and leg swelling.   Gastrointestinal: Positive for nausea, vomiting (x 10), abdominal pain (RUQ) and diarrhea. Negative for blood in stool.   Genitourinary: Negative for dysuria, hematuria, vaginal discharge and vaginal pain.   Musculoskeletal:  Negative for back pain, joint swelling and neck pain.   Skin: Negative for rash.   Neurological: Negative for dizziness, syncope, weakness and headaches.   Psychiatric/Behavioral: Positive for sleep disturbance. Negative for dysphoric mood. The patient is not nervous/anxious.      PAST MEDICAL HISTORY     has a past medical history of Pneumonia.    SURGICAL HISTORY     has past surgical history that includes Tonsillectomy.    CURRENT MEDICATIONS    Discharge Medication List as of 05/07/2014 10:21 PM      CONTINUE these medications which have NOT CHANGED    Details   metoclopramide (REGLAN) 10 MG tablet Take 1 tablet by mouth 3 times daily as needed WARNING:  May cause drowsiness.  May impair ability to operate vehicles or machinery.  Do not use in combination with alcohol., Disp-30 tablet, R-0             ALLERGIES    has No Known Allergies.    FAMILY HISTORY    has no family status information on file.  family history is not on file.    SOCIAL HISTORY     reports that she has never smoked. She has never used smokeless tobacco. She reports that she does not drink alcohol or use illicit drugs.    PHYSICAL EXAM      INITIAL VITALS: BP 106/42 mmHg  Pulse 97   Temp(Src) 98.7 ??F (37.1 ??C) (Oral)   Resp 18   Ht  (1.6 m)   Wt 204 lb 6 oz (92.704 kg)   BMI 36.21 kg/m2   SpO2 100%   LMP 03/07/2014   Physical Exam   Constitutional: She is oriented to person, place, and time. She appears well-developed and well-nourished.  Non-toxic appearance. No distress.   HENT:   Head: Normocephalic and atraumatic.   Right Ear: External ear normal.   Left Ear: External ear normal.   Nose: Nose normal.   Mouth/Throat: Oropharynx is clear and moist. No oropharyngeal exudate.   Eyes: Conjunctivae and EOM are normal. Pupils are equal, round, and reactive to light. Right eye exhibits no discharge. Left eye exhibits no discharge. No scleral icterus.   Neck: Normal range of motion. Neck supple. No JVD present. No tracheal deviation present.    Cardiovascular: Normal rate, regular rhythm, S1 normal, S2 normal, normal heart sounds, intact distal pulses and normal pulses.  Exam reveals no gallop, no S3, no S4, no distant heart sounds, no friction rub and no decreased pulses.    No murmur heard.  Pulmonary/Chest: Effort normal and breath sounds normal. No respiratory distress. She has no decreased breath sounds. She has no wheezes. She has no rhonchi. She has no rales. She exhibits no tenderness.   Abdominal: Soft. Bowel sounds are normal. She exhibits no distension and no mass. There is tenderness (mid-abdominal) in the right upper quadrant. There is no rebound and no guarding.   Musculoskeletal: Normal range of motion. She exhibits no edema or tenderness.   Lymphadenopathy:     She has no cervical adenopathy.   Neurological: She is alert and oriented to person, place, and time. She has normal strength and normal reflexes. No cranial nerve deficit or sensory deficit. She exhibits normal muscle tone. Coordination normal.   Skin: Skin is warm, dry and intact. No rash noted. She is not diaphoretic. No erythema. No pallor.   Psychiatric: She has a normal mood and affect. Her speech is normal and behavior is normal. Judgment and thought content normal.   Nursing note and vitals reviewed.    MEDICAL DECISION MAKING    DIFFERENTIAL DIAGNOSIS:  Peptic Ulcer vs. GERD. Vs. Hyperemesis Gravidarum vs. Viral Illness vs. Diarrhea vs. Gastroenteritis.       DIAGNOSTIC RESULTS    LABS:   Labs Reviewed   CBC WITH AUTO DIFFERENTIAL - Abnormal; Notable for the following:     WBC 11.8 (*)     MCV 79.8 (*)     MCH 25.8 (*)     MCHC 32.3 (*)     Segs Absolute 9.3 (*)     All other components within normal limits   BASIC METABOLIC PANEL - Abnormal; Notable for the following:     CO2 16 (*)     Glucose 124 (*)     BUN 4 (*)     All other components within normal limits   HEPATIC FUNCTION PANEL - Abnormal; Notable for the following:     Total Bilirubin 0.2 (*)     All other  components within normal limits   HCG, QUANTITATIVE, PREGNANCY - Abnormal; Notable for the following:     hCG,Beta Subunit,Qual,Serum 75534.0 (*)     All other components within normal limits   URINE WITH REFLEXED MICRO - Abnormal; Notable for the following:     Bilirubin Urine SMALL (*)     Protein, UA 30 (*)  Leukocyte Esterase, Urine MODERATE (*)     Color, UA DK YELLOW (*)     Character, Urine CLOUDY (*)     All other components within normal limits   ANION GAP - Abnormal; Notable for the following:     Anion Gap 23.7 (*)     All other components within normal limits   OSMOLALITY - Abnormal; Notable for the following:     Osmolality Calc 270.3 (*)     All other components within normal limits   URINE CULTURE, REFLEXED   LIPASE   BILE ACIDS, TOTAL   GLOMERULAR FILTRATION RATE, ESTIMATED     All other unresulted laboratory test above are normal:    Vitals:    Filed Vitals:    05/07/14 1810 05/07/14 1937 05/07/14 2030   BP: 127/69 132/55 103/53   Pulse: 92 70 79   Temp: 99.2 ??F (37.3 ??C)  98.7 ??F (37.1 ??C)   TempSrc:   Oral   Resp: Height:  (1.6 m)     Weight: 204 lb 6 oz (92.704 kg)     SpO2: 99% 96% 94%     1701:  The patient was seen and evaluated.  Appropriate labs and imaging were ordered.      2214: Discussed results, diagnosis and plan with patient.  Questions addressed.  Disposition and follow-up agreed upon.  Specific discharge instructions explained, including reasons to return to the emergency department.    EMERGENCY DEPARTMENT COURSE:    Medications   metoclopramide (REGLAN) injection 10 mg (10 mg Intravenous Given 05/07/14 1935)   0.9 % sodium chloride bolus (0 mLs Intravenous Stopped 05/07/14 2033)       The pt was seen and evaluated by me. Physical examination revealed RUQ and mid-abdominal tenderness.  Within the department, I observed the pt's vital signs to be within acceptable range. Laboratory and Radiological studies were performed, results were reviewed with the patient. The  patient's laboratory studies revealed bilirubin, protein, and leukocytes within dark yellow, cloudy urine.  Within the department, the pt was treated with IV fluids and Reglan. I observed the pt's condition to remain stable during the duration of their stay. I explained my proposed course of treatment to the pt, and they were amenable to my decision. They were discharged home, and they will return to the ED if their symptoms become more severe in nature, or otherwise change.     CRITICAL CARE:   None.    CONSULTS:  None.     PROCEDURES:  None.    FINAL IMPRESSION       1. Hyperemesis gravidarum with dehydration    2. Heartburn during pregnancy, unspecified trimester          DISPOSITION/PLAN  PATIENT REFERRED TO:  Wynona Dove, MD  [address]  [place] [address]  [phone]    Schedule an appointment as soon as possible for a visit in 3 days      Windell Moulding, MD  [address]  [place] [address]  [phone]    Schedule an appointment as soon as possible for a visit in 3 days      DISCHARGE MEDICATIONS:  Discharge Medication List as of 05/07/2014 10:21 PM      START taking these medications    Details   pantoprazole (PROTONIX) 40 MG tablet Take 1 tablet by mouth daily, Disp-30 tablet, R-0      promethazine (PHENERGAN) 25 MG  tablet 1 tablet every 6-8 hours for nausea vomiting when necessary, Disp-20 tablet, R-0             (Please note that portions of this note were completed with a voice recognition program.  Efforts were made to edit the dictations but occasionally words are mis-transcribed.)      Scribe:  Melynda KellerCara Walden 05/10/2014 7:15 PM Scribing for and in the presence of Durward MallardManuel J Ashely Joshua, MD.    Provider:  I personally performed the services described in the documentation, reviewed and edited the documentation which was dictated to the scribe in my presence, and it accurately records my words and actions.    Durward MallardManuel J Spenser Harren, MD 05/10/2014 9:38 AM      Durward MallardManuel J Larron Armor, MD      Emergency room  physician              Durward MallardManuel J Rasheen Schewe, MD  05/10/14 [phone]

## 2014-05-07 NOTE — ED Notes (Signed)
Clean catch urine tea colored and cloudy    Yvonne LuzLinda Virdia Ziesmer, RN  05/07/14 [phone]

## 2014-05-07 NOTE — ED Notes (Signed)
Pt to room 35 for hyper emesis in pregnancy.  Pt states too many to count in last 24 hours.  Pt denies diarrhea.  Pt states was seen for this last week at this facility and started on anti nausea medications which did help a little.   Pt states does not have an OB as of yet still needs to make her first appointment.  Pt states a lot of heartburn with this.  Pt states emesis is bile. Abdomen tender to palp mid to upper.      Jeanine LuzLinda Camyra Vaeth, RN  05/07/14 1901

## 2014-05-08 LAB — URINE WITH REFLEXED MICRO
Blood, Urine: NEGATIVE
CASTS 2: NONE SEEN /lpf
Casts UA: >15 /lpf
Crystals, UA: NONE SEEN
Glucose, Ur: NEGATIVE mg/dl
Ketones, Urine: >=160
MISCELLANEOUS 2: NONE SEEN
Nitrite, Urine: NEGATIVE
Protein, UA: 30 — AB
Renal Epithelial, UA: NONE SEEN
Specific Gravity, Urine: 1.028 (ref 1.002–1.03)
Urobilinogen, Urine: 1 eu/dl (ref 0.0–1.0)
Yeast, UA: NONE SEEN
pH, UA: 6 (ref 5.0–9.0)

## 2014-05-08 LAB — HEPATIC FUNCTION PANEL
ALT: 13 U/L (ref 11–66)
AST: 16 U/L (ref 5–40)
Albumin: 3.6 gm/dl (ref 3.5–5.1)
Alkaline Phosphatase: 65 U/L (ref 38–126)
Bilirubin, Direct: <0.2 mg/dl (ref 0.0–0.3)
Total Bilirubin: 0.2 mg/dl — ABNORMAL LOW (ref 0.3–1.2)
Total Protein: 7.1 gm/dl (ref 6.1–8.0)

## 2014-05-08 LAB — HCG, QUANTITATIVE, PREGNANCY: hCG,Beta Subunit,Qual,Serum: 75534 m[IU]/mL — ABNORMAL HIGH (ref 0.0–5.0)

## 2014-05-08 LAB — CBC WITH AUTO DIFFERENTIAL
Basophils Absolute: 0 10*3/uL (ref 0.0–0.1)
Basophils: 0.3 %
Eosinophils Absolute: 0 10*3/uL (ref 0.0–0.4)
Eosinophils: 0.2 %
Hematocrit: 40.4 % (ref 37.0–47.0)
Hemoglobin: 13.1 gm/dl (ref 12.0–16.0)
Lymphocytes Absolute: 1.9 10*3/uL (ref 1.0–4.8)
Lymphocytes: 16 %
MCH: 25.8 pg — ABNORMAL LOW (ref 27.0–31.0)
MCHC: 32.3 gm/dl — ABNORMAL LOW (ref 33.0–37.0)
MCV: 79.8 fL — ABNORMAL LOW (ref 81.0–99.0)
MPV: 7.8 mcm (ref 7.4–10.4)
Monocytes Absolute: 0.6 10*3/uL (ref 0.4–1.3)
Monocytes: 5.1 %
Platelets: 276 10*3/uL (ref 130–400)
RBC Morphology: NORMAL
RBC: 5.07 10*6/uL (ref 4.20–5.40)
RDW: 14.1 % (ref 11.5–14.5)
Seg Neutrophils: 78.4 %
Segs Absolute: 9.3 10*3/uL — ABNORMAL HIGH (ref 1.8–7.7)
WBC: 11.8 10*3/uL — ABNORMAL HIGH (ref 4.8–10.8)
nRBC: 0 /100 wbc

## 2014-05-08 LAB — BASIC METABOLIC PANEL
BUN: 4 mg/dl — ABNORMAL LOW (ref 7–22)
CO2: 16 meq/l — ABNORMAL LOW (ref 23–33)
Calcium: 8.9 mg/dl (ref 8.5–10.5)
Chloride: 100 meq/l (ref 98–111)
Creatinine: 0.5 mg/dl (ref 0.4–1.2)
Glucose: 124 mg/dl — ABNORMAL HIGH (ref 70–108)
Potassium: 3.7 meq/l (ref 3.5–5.2)
Sodium: 136 meq/l (ref 135–145)

## 2014-05-08 LAB — ANION GAP: Anion Gap: 23.7 — ABNORMAL HIGH (ref 10.0–20.0)

## 2014-05-08 LAB — LIPASE: Lipase: 20.9 U/L (ref 5.6–51.3)

## 2014-05-08 LAB — GLOMERULAR FILTRATION RATE, ESTIMATED: Est, Glom Filt Rate: >90 mL/min/{1.73_m2}

## 2014-05-08 LAB — BILE ACIDS, TOTAL: Ictotest: NEGATIVE

## 2014-05-08 LAB — OSMOLALITY: Osmolality Calc: 270.3 mOsmol/kg — ABNORMAL LOW (ref 275.0–300)

## 2014-05-08 MED ORDER — METOCLOPRAMIDE HCL 5 MG/ML IJ SOLN
5 MG/ML | Freq: Once | INTRAMUSCULAR | Status: AC
Start: 2014-05-08 — End: 2014-05-07
  Administered 2014-05-08: 01:00:00 10 mg via INTRAVENOUS

## 2014-05-08 MED ORDER — PANTOPRAZOLE SODIUM 40 MG PO TBEC
40 MG | ORAL_TABLET | Freq: Every day | ORAL | Status: AC
Start: 2014-05-08 — End: ?

## 2014-05-08 MED ORDER — DEXTROSE IN LACTATED RINGERS 5 % IV SOLN
5 % | INTRAVENOUS | Status: DC
Start: 2014-05-08 — End: 2014-05-08
  Administered 2014-05-08: 02:00:00 via INTRAVENOUS

## 2014-05-08 MED ORDER — PROMETHAZINE HCL 25 MG PO TABS
25 MG | ORAL_TABLET | ORAL | Status: AC
Start: 2014-05-08 — End: ?

## 2014-05-08 MED ORDER — SODIUM CHLORIDE 0.9 % IV BOLUS
0.9 % | Freq: Once | INTRAVENOUS | Status: AC
Start: 2014-05-08 — End: 2014-05-07
  Administered 2014-05-08: 1000 mL via INTRAVENOUS

## 2014-05-08 MED FILL — METOCLOPRAMIDE HCL 5 MG/ML IJ SOLN: 5 MG/ML | INTRAMUSCULAR | Qty: 2

## 2014-05-09 LAB — URINE CULTURE, REFLEXED

## 2015-02-03 ENCOUNTER — Other Ambulatory Visit (HOSPITAL_COMMUNITY)
Admission: RE | Admit: 2015-02-03 | Discharge: 2015-02-03 | Disposition: A | Discharge status: Home / Self Care | Source: Ambulatory Visit | Attending: Nurse Practitioner | Admitting: Nurse Practitioner

## 2015-02-03 ENCOUNTER — Other Ambulatory Visit: Payer: Self-pay | Admitting: Nurse Practitioner

## 2015-02-03 DIAGNOSIS — Z1151 Encounter for screening for human papillomavirus (HPV): Secondary | ICD-10-CM | POA: Insufficient documentation

## 2015-02-03 DIAGNOSIS — Z01419 Encounter for gynecological examination (general) (routine) without abnormal findings: Secondary | ICD-10-CM | POA: Insufficient documentation

## 2015-02-11 LAB — CYTOLOGY - PAP

## 2015-04-08 DIAGNOSIS — R599 Enlarged lymph nodes, unspecified: Secondary | ICD-10-CM | POA: Insufficient documentation

## 2015-04-08 HISTORY — DX: Enlarged lymph nodes, unspecified: R59.9

## 2016-11-21 DIAGNOSIS — L988 Other specified disorders of the skin and subcutaneous tissue: Secondary | ICD-10-CM | POA: Insufficient documentation

## 2018-01-06 ENCOUNTER — Encounter: Payer: Self-pay | Admitting: Emergency Medicine

## 2018-01-06 ENCOUNTER — Emergency Department (HOSPITAL_COMMUNITY): Payer: BLUE CROSS/BLUE SHIELD

## 2018-01-06 ENCOUNTER — Emergency Department (HOSPITAL_COMMUNITY)
Admission: EM | Admit: 2018-01-06 | Discharge: 2018-01-06 | Disposition: A | Discharge status: Home / Self Care | Payer: BLUE CROSS/BLUE SHIELD | Attending: Emergency Medicine | Admitting: Emergency Medicine

## 2018-01-06 DIAGNOSIS — R079 Chest pain, unspecified: Secondary | ICD-10-CM | POA: Diagnosis not present

## 2018-01-06 DIAGNOSIS — R51 Headache: Secondary | ICD-10-CM | POA: Insufficient documentation

## 2018-01-06 DIAGNOSIS — Z79899 Other long term (current) drug therapy: Secondary | ICD-10-CM | POA: Diagnosis not present

## 2018-01-06 DIAGNOSIS — E119 Type 2 diabetes mellitus without complications: Secondary | ICD-10-CM | POA: Insufficient documentation

## 2018-01-06 DIAGNOSIS — R519 Headache, unspecified: Secondary | ICD-10-CM

## 2018-01-06 LAB — CBC
HCT: 46.3 % — ABNORMAL HIGH (ref 36.0–46.0)
Hemoglobin: 14.2 g/dL (ref 12.0–15.0)
MCH: 25.7 pg — AB (ref 26.0–34.0)
MCHC: 30.7 g/dL (ref 30.0–36.0)
MCV: 83.7 fL (ref 78.0–100.0)
PLATELETS: 428 10*3/uL — AB (ref 150–400)
RBC: 5.53 MIL/uL — AB (ref 3.87–5.11)
RDW: 13.4 % (ref 11.5–15.5)
WBC: 8.9 10*3/uL (ref 4.0–10.5)

## 2018-01-06 LAB — BASIC METABOLIC PANEL
ANION GAP: 15 (ref 5–15)
BUN: 11 mg/dL (ref 6–20)
CO2: 22 mmol/L (ref 22–32)
CREATININE: 0.77 mg/dL (ref 0.44–1.00)
Calcium: 9.5 mg/dL (ref 8.9–10.3)
Chloride: 96 mmol/L — ABNORMAL LOW (ref 98–111)
Glucose, Bld: 345 mg/dL — ABNORMAL HIGH (ref 70–99)
Potassium: 4.1 mmol/L (ref 3.5–5.1)
Sodium: 133 mmol/L — ABNORMAL LOW (ref 135–145)

## 2018-01-06 LAB — I-STAT TROPONIN, ED
TROPONIN I, POC: 0.01 ng/mL (ref 0.00–0.08)
Troponin i, poc: 0 ng/mL (ref 0.00–0.08)

## 2018-01-06 LAB — I-STAT BETA HCG BLOOD, ED (MC, WL, AP ONLY): I-stat hCG, quantitative: <5 m[IU]/mL (ref <5)

## 2018-01-06 MED ORDER — SODIUM CHLORIDE 0.9 % IV BOLUS: 1000.0000 mL | Freq: Once | INTRAVENOUS | Status: DC

## 2018-01-06 NOTE — Discharge Instructions (Addendum)
Follow-up with your primary care doctor to adjust your elevated blood sugar and the likely need to be started on diabetes medications, take over-the-counter medications as needed for the headache, plenty of fluids

## 2018-01-06 NOTE — ED Notes (Signed)
Pt verbalized understanding discharge instructions and denies any further needs or questions at this time. VS stable, ambulatory and steady gait.   

## 2018-01-06 NOTE — ED Provider Notes (Signed)
[facility]   CSN: [id number] Arrival date & time: 01/06/18  1222     History   Chief Complaint Chief Complaint  Patient presents with  . Headache  . Chest Pain    HPI Roberta Lutz is a 22 y.o. female.  HPI Patient presents to the emergency room for chest pain.  Patient states she started having chest pain earlier today.  It has been constant throughout most of the day.  Initially it was an ache on the left side but she is also had some sharp pain on the right side.  Patient states she has felt short of breath.  She denies any vomiting.  No fevers or cough.  Nothing seems to make it particularly better or worse.  Patient denies any history of heart disease.  No history of PE.  Patient also mentions she feels a little bit lightheaded.  She is had a headache since yesterday as well.  She took some over-the-counter medications yesterday but nothing today.  She denies any neck stiffness.  No focal numbness or weakness.  No vision issues.  No speech problems. Past Medical History:  Diagnosis Date  . Pilonidal cyst     Patient Active Problem List   Diagnosis Date Noted  . Pilonidal cyst 07/03/2011    History reviewed. No pertinent surgical history.   OB History   None      Home Medications    Prior to Admission medications   Medication Sig Start Date End Date Taking? Authorizing Provider  amoxicillin-clavulanate (AUGMENTIN) 875-125 MG per tablet Take 1 tablet by mouth 2 (two) times daily. 11/19/11   Glenna FellowsHoxworth, Benjamin, MD  cetirizine (ZYRTEC) 10 MG tablet Take 10 mg by mouth daily.    [provider]  HYDROcodone-acetaminophen (NORCO/VICODIN) 5-325 MG per tablet Take 1-2 tablets by mouth every 4 (four) hours as needed. 11/19/11   Glenna FellowsHoxworth, Benjamin, MD    Family History History reviewed. No pertinent family history.  Social History Social History   Tobacco Use  . Smoking status: Never Smoker  . Smokeless  tobacco: Never Used  Substance Use Topics  . Alcohol use: No  . Drug use: No     Allergies   Patient has no known allergies.   Review of Systems Review of Systems  All other systems reviewed and are negative.    Physical Exam Updated Vital Signs BP (!) 119/95 (BP Location: Right Arm)   Pulse 95   Temp 98 F (36.7 C) (Oral)   Resp 20   LMP 12/31/2017   SpO2 100%   Physical Exam  Constitutional: She appears well-developed and well-nourished. No distress.  HENT:  Head: Normocephalic and atraumatic.  Right Ear: External ear normal.  Left Ear: External ear normal.  Eyes: Conjunctivae are normal. Right eye exhibits no discharge. Left eye exhibits no discharge. No scleral icterus.  Neck: Neck supple. No tracheal deviation present.  Cardiovascular: Normal rate, regular rhythm and intact distal pulses.  Pulmonary/Chest: Effort normal and breath sounds normal. No stridor. No respiratory distress. She has no wheezes. She has no rales.  Abdominal: Soft. Bowel sounds are normal. She exhibits no distension. There is no tenderness. There is no rebound and no guarding.  Musculoskeletal: She exhibits no edema or tenderness.  Neurological: She is alert. She has normal strength. No cranial nerve deficit (no facial droop, extraocular movements intact, no slurred speech) or sensory deficit. She exhibits normal muscle tone. She displays no seizure activity. Coordination normal.  Skin: Skin is warm and dry. No rash noted.  Psychiatric: She has a normal mood and affect.  Nursing note and vitals reviewed.    ED Treatments / Results  Labs (all labs ordered are listed, but only abnormal results are displayed) Labs Reviewed  BASIC METABOLIC PANEL - Abnormal; Notable for the following components:      Result Value   Sodium 133 (*)    Chloride 96 (*)    Glucose, Bld 345 (*)    All other components within normal limits  CBC - Abnormal; Notable for the following components:   RBC 5.53 (*)      HCT 46.3 (*)    MCH 25.7 (*)    Platelets 428 (*)    All other components within normal limits  I-STAT TROPONIN, ED  I-STAT BETA HCG BLOOD, ED ([facility], [facility], [facility] ONLY)  I-STAT TROPONIN, ED    EKG EKG Interpretation  Date/Time:  Monday January 06 2018 12:31:14 EDT Ventricular Rate:  94 PR Interval:  160 QRS Duration: 68 QT Interval:  354 QTC Calculation: 442 R Axis:   43 Text Interpretation:  Normal sinus rhythm Early repolarization Abnormal ECG No old tracing to compare Confirmed by Linwood Dibbles [phone]) on 01/06/2018 3:41:25 PM   Radiology Dg Chest 2 View  Result Date: 01/06/2018 CLINICAL DATA:  22 year old with chest pain. EXAM: CHEST - 2 VIEW COMPARISON:  None. FINDINGS: The heart size and mediastinal contours are within normal limits. Both lungs are clear. The visualized skeletal structures are unremarkable. Negative for a pneumothorax. IMPRESSION: No active cardiopulmonary disease. Electronically Signed   By: Richarda Overlie M.D.   On: 01/06/2018 13:14    Procedures Procedures (including critical care time)  Medications Ordered in ED Medications  sodium chloride 0.9 % bolus 1,000 mL (has no administration in time range)     Initial Impression / Assessment and Plan / ED Course  I have reviewed the triage vital signs and the nursing notes.  Pertinent labs & imaging results that were available during my care of the patient were reviewed by me and considered in my medical decision making (see chart for details).  Clinical Course as of Jan 07 1540  Mon Jan 06, 2018  1539 Initial labs are notable for an elevated blood sugar.  Patient mentions she is a type II diabetic.  She is not on medications.  She has not seen a doctor for a while   [JK]    Clinical Course User Index [JK] Linwood Dibbles, MD    Patient presented to the emergency room for evaluation of chest pain.  Patient is low risk for acute coronary syndrome.  Her EKG and serial troponins are reassuring.  I doubt cardiac  etiology for her chest pain.  Patient has a mild headache.  She does not have any neurologic abnormalities.  No signs to suggest infection.  Likely benign tension headache.  She is laboratory test did show that her blood sugar is elevated.  Patient has been told she is diabetic but just has been managing with diet.  Patient does have a primary care doctor.  I told her she will likely need to get started on diabetes medications.  She will make an appointment with her primary care doctor for further treatment.  Final Clinical Impressions(s) / ED Diagnoses   Final diagnoses:  Type 2 diabetes mellitus without complication, without long-term current use of insulin (HCC)  Chest pain, unspecified type    ED Discharge Orders  None       Linwood Dibbles, MD 01/06/18 [phone]

## 2018-01-06 NOTE — ED Triage Notes (Signed)
Pt in c/o headache, dizziness, and chest pain since yesterday, no distress noted

## 2018-11-01 ENCOUNTER — Other Ambulatory Visit: Payer: Self-pay | Admitting: Family Medicine

## 2018-11-01 DIAGNOSIS — R1010 Upper abdominal pain, unspecified: Secondary | ICD-10-CM

## 2018-11-01 DIAGNOSIS — R112 Nausea with vomiting, unspecified: Secondary | ICD-10-CM

## 2018-11-07 ENCOUNTER — Ambulatory Visit
Admission: RE | Admit: 2018-11-07 | Discharge: 2018-11-07 | Disposition: A | Discharge status: Home / Self Care | Payer: BC Managed Care – PPO | Source: Ambulatory Visit | Attending: Family Medicine | Admitting: Family Medicine

## 2018-11-07 ENCOUNTER — Other Ambulatory Visit: Payer: Self-pay

## 2018-11-07 DIAGNOSIS — R112 Nausea with vomiting, unspecified: Secondary | ICD-10-CM

## 2018-11-07 DIAGNOSIS — R1010 Upper abdominal pain, unspecified: Secondary | ICD-10-CM

## 2019-01-12 ENCOUNTER — Other Ambulatory Visit: Payer: Self-pay | Admitting: Gastroenterology

## 2019-01-12 DIAGNOSIS — R109 Unspecified abdominal pain: Secondary | ICD-10-CM

## 2019-01-12 DIAGNOSIS — R112 Nausea with vomiting, unspecified: Secondary | ICD-10-CM

## 2019-01-13 ENCOUNTER — Emergency Department (HOSPITAL_COMMUNITY)
Admission: EM | Admit: 2019-01-13 | Discharge: 2019-01-13 | Disposition: A | Discharge status: Home / Self Care | Payer: BC Managed Care – PPO | Attending: Emergency Medicine | Admitting: Emergency Medicine

## 2019-01-13 ENCOUNTER — Encounter (HOSPITAL_COMMUNITY): Payer: Self-pay | Admitting: Emergency Medicine

## 2019-01-13 ENCOUNTER — Other Ambulatory Visit: Payer: Self-pay

## 2019-01-13 DIAGNOSIS — R112 Nausea with vomiting, unspecified: Secondary | ICD-10-CM | POA: Diagnosis present

## 2019-01-13 DIAGNOSIS — R1084 Generalized abdominal pain: Secondary | ICD-10-CM | POA: Insufficient documentation

## 2019-01-13 DIAGNOSIS — R197 Diarrhea, unspecified: Secondary | ICD-10-CM | POA: Diagnosis not present

## 2019-01-13 LAB — URINALYSIS, ROUTINE W REFLEX MICROSCOPIC
Bilirubin Urine: NEGATIVE
Glucose, UA: NEGATIVE mg/dL
Hgb urine dipstick: NEGATIVE
Ketones, ur: 20 mg/dL — AB
Leukocytes,Ua: NEGATIVE
Nitrite: NEGATIVE
Protein, ur: NEGATIVE mg/dL
Specific Gravity, Urine: 1.021 (ref 1.005–1.030)
pH: 8 (ref 5.0–8.0)

## 2019-01-13 LAB — COMPREHENSIVE METABOLIC PANEL
ALT: 9 U/L (ref 0–44)
AST: 20 U/L (ref 15–41)
Albumin: 4.4 g/dL (ref 3.5–5.0)
Alkaline Phosphatase: 53 U/L (ref 38–126)
Anion gap: 10 (ref 5–15)
BUN: 7 mg/dL (ref 6–20)
CO2: 22 mmol/L (ref 22–32)
Calcium: 9 mg/dL (ref 8.9–10.3)
Chloride: 105 mmol/L (ref 98–111)
Creatinine, Ser: 0.76 mg/dL (ref 0.44–1.00)
GFR calc Af Amer: >60 mL/min (ref >60)
GFR calc non Af Amer: >60 mL/min (ref >60)
Glucose, Bld: 90 mg/dL (ref 70–99)
Potassium: 4 mmol/L (ref 3.5–5.1)
Sodium: 137 mmol/L (ref 135–145)
Total Bilirubin: 0.8 mg/dL (ref 0.3–1.2)
Total Protein: 7.4 g/dL (ref 6.5–8.1)

## 2019-01-13 LAB — CBC WITH DIFFERENTIAL/PLATELET
Abs Immature Granulocytes: 0.03 10*3/uL (ref 0.00–0.07)
Basophils Absolute: 0 10*3/uL (ref 0.0–0.1)
Basophils Relative: 0 %
Eosinophils Absolute: 0.1 10*3/uL (ref 0.0–0.5)
Eosinophils Relative: 1 %
HCT: 39 % (ref 36.0–46.0)
Hemoglobin: 12.1 g/dL (ref 12.0–15.0)
Immature Granulocytes: 0 %
Lymphocytes Relative: 27 %
Lymphs Abs: 2.7 10*3/uL (ref 0.7–4.0)
MCH: 26.8 pg (ref 26.0–34.0)
MCHC: 31 g/dL (ref 30.0–36.0)
MCV: 86.3 fL (ref 80.0–100.0)
Monocytes Absolute: 0.8 10*3/uL (ref 0.1–1.0)
Monocytes Relative: 8 %
Neutro Abs: 6.6 10*3/uL (ref 1.7–7.7)
Neutrophils Relative %: 64 %
Platelets: 366 10*3/uL (ref 150–400)
RBC: 4.52 MIL/uL (ref 3.87–5.11)
RDW: 13.6 % (ref 11.5–15.5)
WBC: 10.2 10*3/uL (ref 4.0–10.5)
nRBC: 0 % (ref 0.0–0.2)

## 2019-01-13 LAB — POC URINE PREG, ED: Preg Test, Ur: NEGATIVE

## 2019-01-13 LAB — LIPASE, BLOOD: Lipase: 15 U/L (ref 11–51)

## 2019-01-13 MED ORDER — ALUM & MAG HYDROXIDE-SIMETH 200-200-20 MG/5ML PO SUSP
30.0000 mL | Freq: Once | ORAL | Status: AC
  Administered 2019-01-13: 30 mL via ORAL
  Filled 2019-01-13: qty 30

## 2019-01-13 MED ORDER — SODIUM CHLORIDE 0.9 % IV BOLUS
1000.0000 mL | Freq: Once | INTRAVENOUS | Status: AC
  Administered 2019-01-13: 1000 mL via INTRAVENOUS

## 2019-01-13 MED ORDER — METOCLOPRAMIDE HCL 5 MG/ML IJ SOLN
10.0000 mg | Freq: Once | INTRAMUSCULAR | Status: AC
  Administered 2019-01-13: 10 mg via INTRAVENOUS
  Filled 2019-01-13: qty 2

## 2019-01-13 MED ORDER — DIPHENHYDRAMINE HCL 50 MG/ML IJ SOLN
25.0000 mg | Freq: Once | INTRAMUSCULAR | Status: AC
  Administered 2019-01-13: 25 mg via INTRAVENOUS
  Filled 2019-01-13: qty 1

## 2019-01-13 MED ORDER — ONDANSETRON 4 MG PO TBDP: ORAL_TABLET | ORAL | 0 refills | Status: DC

## 2019-01-13 MED ORDER — PROMETHAZINE HCL 25 MG RE SUPP: 25.0000 mg | Freq: Four times a day (QID) | RECTAL | 0 refills | Status: DC | PRN

## 2019-01-13 NOTE — ED Provider Notes (Signed)
[facility]-EMERGENCY DEPT Provider Note   CSN: [id number] Arrival date & time: 01/13/19  0907     History   Chief Complaint Chief Complaint  Patient presents with  . Abdominal Pain  . Hematemesis    HPI Roberta Lutz is a 23 y.o. female.     23 yo F with a chief complaints of nausea and vomiting.  This been going on for the past 3 weeks.  Worsening today not able to keep anything down.  She is scheduled for an endoscopy tomorrow for evaluation.  She had one episode of vomiting today that had some blood mixed in with it.  Denies continued episodes with bleeding.  No significant chest pain or fever.  Has diffuse abdominal burning.  Has some cramping after vomiting.  No urinary symptoms.  Feels that she is unlikely to be pregnant.  The history is provided by the patient.  Abdominal Pain Pain location:  Generalized Pain radiates to:  Does not radiate Pain severity:  Mild Onset quality:  Gradual Duration:  2 hours Timing:  Constant Progression:  Worsening Chronicity:  New Relieved by:  Nothing Worsened by:  Nothing Ineffective treatments:  None tried Associated symptoms: diarrhea and vomiting   Associated symptoms: no chest pain, no chills, no dysuria, no fever, no nausea and no shortness of breath     Past Medical History:  Diagnosis Date  . Pilonidal cyst     Patient Active Problem List   Diagnosis Date Noted  . Pilonidal cyst 07/03/2011    History reviewed. No pertinent surgical history.   OB History   No obstetric history on file.      Home Medications    Prior to Admission medications   Medication Sig Start Date End Date Taking? Authorizing Provider  acetaminophen (TYLENOL) 650 MG CR tablet Take 650 mg by mouth every 8 (eight) hours as needed for pain.   Yes [provider]  Melatonin 10 MG CAPS Take 10 mg by mouth at bedtime as needed (sleep).   Yes [provider]  methotrexate (RHEUMATREX) 2.5 MG tablet Take  5-7.5 mg by mouth as needed (immflamed).  12/02/18  Yes [provider]  ondansetron (ZOFRAN) 4 MG tablet Take 4 mg by mouth every 6 (six) hours as needed for nausea/vomiting. 12/30/18  Yes [provider]  sucralfate (CARAFATE) 1 g tablet Take 1 g by mouth 3 (three) times daily before meals. 01/09/19  Yes [provider]  amoxicillin-clavulanate (AUGMENTIN) 875-125 MG per tablet Take 1 tablet by mouth 2 (two) times daily. Patient not taking: Reported on 01/13/2019 11/19/11   Glenna Fellows, MD  HYDROcodone-acetaminophen (NORCO/VICODIN) 5-325 MG per tablet Take 1-2 tablets by mouth every 4 (four) hours as needed. Patient not taking: Reported on 01/13/2019 11/19/11   Glenna Fellows, MD  ondansetron [facility] ODT) 4 MG disintegrating tablet 4mg  ODT q4 hours prn nausea/vomit 01/13/19   01/15/19, DO  promethazine (PHENERGAN) 25 MG suppository Place 1 suppository (25 mg total) rectally every 6 (six) hours as needed for nausea or vomiting. 01/13/19   01/15/19, DO    Family History No family history on file.  Social History Social History   Tobacco Use  . Smoking status: Never Smoker  . Smokeless tobacco: Never Used  Substance Use Topics  . Alcohol use: No  . Drug use: No     Allergies   Patient has no known allergies.   Review of Systems Review of Systems  Constitutional: Negative for chills  and fever.  HENT: Negative for congestion and rhinorrhea.   Eyes: Negative for redness and visual disturbance.  Respiratory: Negative for shortness of breath and wheezing.   Cardiovascular: Negative for chest pain and palpitations.  Gastrointestinal: Positive for abdominal pain, diarrhea and vomiting. Negative for nausea.  Genitourinary: Negative for dysuria and urgency.  Musculoskeletal: Negative for arthralgias and myalgias.  Skin: Negative for pallor and wound.  Neurological: Negative for dizziness and headaches.     Physical Exam Updated Vital Signs BP (!)  96/59   Pulse (!) 51   Temp 98.7 F (37.1 C) (Oral)   Resp 16   LMP 12/15/2018   SpO2 100%   Physical Exam Vitals signs and nursing note reviewed.  Constitutional:      General: She is not in acute distress.    Appearance: She is well-developed. She is not diaphoretic.  HENT:     Head: Normocephalic and atraumatic.  Eyes:     Pupils: Pupils are equal, round, and reactive to light.  Neck:     Musculoskeletal: Normal range of motion and neck supple.  Cardiovascular:     Rate and Rhythm: Normal rate and regular rhythm.     Heart sounds: No murmur. No friction rub. No gallop.   Pulmonary:     Effort: Pulmonary effort is normal.     Breath sounds: No wheezing or rales.  Abdominal:     General: There is no distension.     Palpations: Abdomen is soft.     Tenderness: There is generalized abdominal tenderness.  Musculoskeletal:        General: No tenderness.  Skin:    General: Skin is warm and dry.  Neurological:     Mental Status: She is alert and oriented to person, place, and time.  Psychiatric:        Behavior: Behavior normal.      ED Treatments / Results  Labs (all labs ordered are listed, but only abnormal results are displayed) Labs Reviewed  URINALYSIS, ROUTINE W REFLEX MICROSCOPIC - Abnormal; Notable for the following components:      Result Value   APPearance HAZY (*)    Ketones, ur 20 (*)    All other components within normal limits  LIPASE, BLOOD  COMPREHENSIVE METABOLIC PANEL  CBC WITH DIFFERENTIAL/PLATELET  POC URINE PREG, ED    EKG None  Radiology No results found.  Procedures Procedures (including critical care time)  Medications Ordered in ED Medications  sodium chloride 0.9 % bolus 1,000 mL (1,000 mLs Intravenous New Bag/Given 01/13/19 1330)  metoCLOPramide (REGLAN) injection 10 mg (10 mg Intravenous Given 01/13/19 1330)  diphenhydrAMINE (BENADRYL) injection 25 mg (25 mg Intravenous Given 01/13/19 1330)  alum & mag hydroxide-simeth  (MAALOX/MYLANTA) 200-200-20 MG/5ML suspension 30 mL (30 mLs Oral Given 01/13/19 1330)     Initial Impression / Assessment and Plan / ED Course  I have reviewed the triage vital signs and the nursing notes.  Pertinent labs & imaging results that were available during my care of the patient were reviewed by me and considered in my medical decision making (see chart for details).        23 yo F with a chief complaint of nausea and vomiting.  Going on for the past 3 weeks.  Worsening over the past 48 hours.  Not able to keep anything down over the past morning.  She had one episode that had blood in it.  No other symptoms.  No chest pain no fever.  Will obtain a laboratory evaluation.  Treat nausea.  Reassess.  Feeling much better, tolerating PO.  Will d/c home.  GI follow up.  Lab work without significant electrolyte abnormality, no renal dysfunction, LFTs and lipase are unremarkable.  Patient is not pregnant.  2:39 PM:  I have discussed the diagnosis/risks/treatment options with the patient and believe the pt to be eligible for discharge home to follow-up with GI. We also discussed returning to the ED immediately if new or worsening sx occur. We discussed the sx which are most concerning (e.g., sudden worsening pain, fever, inability to tolerate by mouth) that necessitate immediate return. Medications administered to the patient during their visit and any new prescriptions provided to the patient are listed below.  Medications given during this visit Medications  sodium chloride 0.9 % bolus 1,000 mL (1,000 mLs Intravenous New Bag/Given 01/13/19 1330)  metoCLOPramide (REGLAN) injection 10 mg (10 mg Intravenous Given 01/13/19 1330)  diphenhydrAMINE (BENADRYL) injection 25 mg (25 mg Intravenous Given 01/13/19 1330)  alum & mag hydroxide-simeth (MAALOX/MYLANTA) 200-200-20 MG/5ML suspension 30 mL (30 mLs Oral Given 01/13/19 1330)     The patient appears reasonably screen and/or stabilized for  discharge and I doubt any other medical condition or other [facility] requiring further screening, evaluation, or treatment in the ED at this time prior to discharge.    Final Clinical Impressions(s) / ED Diagnoses   Final diagnoses:  Nausea and vomiting in adult    ED Discharge Orders         Ordered    ondansetron (ZOFRAN ODT) 4 MG disintegrating tablet     01/13/19 1428    promethazine (PHENERGAN) 25 MG suppository  Every 6 hours PRN     01/13/19 Rudd, Izabela Ow, DO 01/13/19 1440

## 2019-01-13 NOTE — Discharge Instructions (Signed)
Try zantac or pepcid twice a day.  Try to avoid things that may make this worse, most commonly these are spicy foods tomato based products fatty foods chocolate and peppermint.  Alcohol and tobacco can also make this worse.  Return to the emergency department for sudden worsening pain fever or inability to eat or drink. ° °

## 2019-01-13 NOTE — ED Triage Notes (Signed)
Pt c/o abd pains and vomiting x 2 months. reports last night when vomiting having bright red blood in vomit. Scheduled next week for endoscopy. Reports feeling dizzy.

## 2019-04-17 NOTE — L&D Delivery Note (Signed)
Delivery Note Went to place [facility] and found cervix to be fully dilated with vertex at +1+2 station.  Pushed well to SVD  At 12:41 AM a viable and healthy female was delivered via Vaginal, Spontaneous (Presentation: Middle Occiput Anterior).  APGAR: , 8; weight  4+8  Baby vigorous, crying. Delayed cord clamping with Dr Eric Form at bedside.  Baby had some issues with oxygen saturation but was allowed to stay in room for now, monitored   Placenta status: Spontaneous, Intact.  Cord: 3 vessels with the following complications: Small.  Sent to pathology  Anesthesia: Epidural Episiotomy: None Lacerations:   Suture Repair: 3.0 vicryl rapide  2 stitches place in left laceration Est. Blood Loss (mL):  100  Mom to postpartum.  Baby to Couplet care / Skin to Skin.  Wynelle Bourgeois 04/07/2020, 1:19 AM

## 2019-09-22 ENCOUNTER — Inpatient Hospital Stay (HOSPITAL_COMMUNITY): Payer: Managed Care, Other (non HMO)

## 2019-09-22 ENCOUNTER — Inpatient Hospital Stay (HOSPITAL_COMMUNITY)
Admission: AD | Admit: 2019-09-22 | Discharge: 2019-09-22 | Disposition: A | Discharge status: Home / Self Care | Payer: Managed Care, Other (non HMO) | Attending: Obstetrics and Gynecology | Admitting: Obstetrics and Gynecology

## 2019-09-22 ENCOUNTER — Other Ambulatory Visit: Payer: Self-pay | Admitting: Obstetrics and Gynecology

## 2019-09-22 ENCOUNTER — Encounter (HOSPITAL_COMMUNITY): Payer: Self-pay | Admitting: *Deleted

## 2019-09-22 DIAGNOSIS — Z79899 Other long term (current) drug therapy: Secondary | ICD-10-CM | POA: Diagnosis not present

## 2019-09-22 DIAGNOSIS — O24111 Pre-existing diabetes mellitus, type 2, in pregnancy, first trimester: Secondary | ICD-10-CM | POA: Diagnosis not present

## 2019-09-22 DIAGNOSIS — O99281 Endocrine, nutritional and metabolic diseases complicating pregnancy, first trimester: Secondary | ICD-10-CM | POA: Insufficient documentation

## 2019-09-22 DIAGNOSIS — O26891 Other specified pregnancy related conditions, first trimester: Secondary | ICD-10-CM | POA: Diagnosis present

## 2019-09-22 DIAGNOSIS — E282 Polycystic ovarian syndrome: Secondary | ICD-10-CM | POA: Diagnosis not present

## 2019-09-22 DIAGNOSIS — O26899 Other specified pregnancy related conditions, unspecified trimester: Secondary | ICD-10-CM | POA: Diagnosis present

## 2019-09-22 DIAGNOSIS — O200 Threatened abortion: Secondary | ICD-10-CM | POA: Insufficient documentation

## 2019-09-22 DIAGNOSIS — O468X1 Other antepartum hemorrhage, first trimester: Secondary | ICD-10-CM | POA: Diagnosis present

## 2019-09-22 DIAGNOSIS — Z833 Family history of diabetes mellitus: Secondary | ICD-10-CM | POA: Insufficient documentation

## 2019-09-22 DIAGNOSIS — N760 Acute vaginitis: Secondary | ICD-10-CM

## 2019-09-22 DIAGNOSIS — O418X10 Other specified disorders of amniotic fluid and membranes, first trimester, not applicable or unspecified: Secondary | ICD-10-CM

## 2019-09-22 DIAGNOSIS — R109 Unspecified abdominal pain: Secondary | ICD-10-CM

## 2019-09-22 DIAGNOSIS — O208 Other hemorrhage in early pregnancy: Secondary | ICD-10-CM | POA: Diagnosis not present

## 2019-09-22 DIAGNOSIS — Z3A08 8 weeks gestation of pregnancy: Secondary | ICD-10-CM

## 2019-09-22 HISTORY — DX: Rosacea, unspecified: L71.9

## 2019-09-22 HISTORY — DX: Type 2 diabetes mellitus without complications: E11.9

## 2019-09-22 HISTORY — DX: Polycystic ovarian syndrome: E28.2

## 2019-09-22 LAB — HCG, QUANTITATIVE, PREGNANCY: hCG, Beta Chain, Quant, S: 65751 m[IU]/mL — ABNORMAL HIGH (ref <5)

## 2019-09-22 LAB — CBC
HCT: 38.4 % (ref 36.0–46.0)
Hemoglobin: 12.5 g/dL (ref 12.0–15.0)
MCH: 27.6 pg (ref 26.0–34.0)
MCHC: 32.6 g/dL (ref 30.0–36.0)
MCV: 84.8 fL (ref 80.0–100.0)
Platelets: 402 10*3/uL — ABNORMAL HIGH (ref 150–400)
RBC: 4.53 MIL/uL (ref 3.87–5.11)
RDW: 12.5 % (ref 11.5–15.5)
WBC: 9.2 10*3/uL (ref 4.0–10.5)
nRBC: 0 % (ref 0.0–0.2)

## 2019-09-22 LAB — URINALYSIS, ROUTINE W REFLEX MICROSCOPIC
Bilirubin Urine: NEGATIVE
Glucose, UA: NEGATIVE mg/dL
Hgb urine dipstick: NEGATIVE
Ketones, ur: NEGATIVE mg/dL
Leukocytes,Ua: NEGATIVE
Nitrite: NEGATIVE
Protein, ur: NEGATIVE mg/dL
Specific Gravity, Urine: 1.009 (ref 1.005–1.030)
pH: 8 (ref 5.0–8.0)

## 2019-09-22 LAB — ABO/RH: ABO/RH(D): O POS

## 2019-09-22 LAB — WET PREP, GENITAL
Sperm: NONE SEEN
Trich, Wet Prep: NONE SEEN
Yeast Wet Prep HPF POC: NONE SEEN

## 2019-09-22 LAB — HIV ANTIBODY (ROUTINE TESTING W REFLEX): HIV Screen 4th Generation wRfx: NONREACTIVE

## 2019-09-22 LAB — POCT PREGNANCY, URINE: Preg Test, Ur: POSITIVE — AB

## 2019-09-22 MED ORDER — METRONIDAZOLE 500 MG PO TABS: 500.0000 mg | ORAL_TABLET | Freq: Two times a day (BID) | ORAL | 0 refills | Status: DC

## 2019-09-22 NOTE — MAU Provider Note (Signed)
History     CSN: [id number]  Arrival date and time: 09/22/19 0923   First Provider Initiated Contact with Patient 09/22/19 1008      Chief Complaint  Patient presents with  . Abdominal Pain   [name] is a 24 y.o. year old G48P0 female at about 8-[redacted] weeks gestation who presents to MAU reporting "extreme cramping" this morning. She states the pain started in the middle of her lower abdomen and now moved to the sides of her lower pelvis. She denies VB, abnormal vaginal discharge or nay recent SI. She took 5 (+) HPT. She has not establish [facility] anywhere at this time. She reports she was last seen at [place] ~5 years ago.   OB History    Gravida  1   Para      Term      Preterm      AB      Living        SAB      TAB      Ectopic      Multiple      Live Births              Past Medical History:  Diagnosis Date  . Diabetes mellitus without complication ([place])    Type II  . PCOS (polycystic ovarian syndrome)   . Pilonidal cyst   . Rosacea     Past Surgical History:  Procedure Laterality Date  . DRUG INDUCED ENDOSCOPY     Gastritis    Family History  Problem Relation Age of Onset  . Diabetes Mother   . Diabetes Maternal Grandmother     Social History   Tobacco Use  . Smoking status: Never Smoker  . Smokeless tobacco: Never Used  Substance Use Topics  . Alcohol use: No  . Drug use: No    Allergies: No Known Allergies  Medications Prior to Admission  Medication Sig Dispense Refill Last Dose  . Melatonin 10 MG CAPS Take 10 mg by mouth at bedtime as needed (sleep).   09/21/2019 at 2100  . acetaminophen (TYLENOL) 650 MG CR tablet Take 650 mg by mouth every 8 (eight) hours as needed for pain.   09/18/2019  . amoxicillin-clavulanate (AUGMENTIN) 875-125 MG per tablet Take 1 tablet by mouth 2 (two) times daily. (Patient not taking: Reported on 01/13/2019) 28 tablet 0   . HYDROcodone-acetaminophen (NORCO/VICODIN) 5-325 MG per tablet Take 1-2  tablets by mouth every 4 (four) hours as needed. (Patient not taking: Reported on 01/13/2019) 30 tablet 1   . methotrexate (RHEUMATREX) 2.5 MG tablet Take 5-7.5 mg by mouth as needed (immflamed).      . ondansetron (ZOFRAN ODT) 4 MG disintegrating tablet 4mg  ODT q4 hours prn nausea/vomit 20 tablet 0   . ondansetron (ZOFRAN) 4 MG tablet Take 4 mg by mouth every 6 (six) hours as needed for nausea/vomiting.     . promethazine (PHENERGAN) 25 MG suppository Place 1 suppository (25 mg total) rectally every 6 (six) hours as needed for nausea or vomiting. 12 each 0   . sucralfate (CARAFATE) 1 g tablet Take 1 g by mouth 3 (three) times daily before meals.       Review of Systems  Constitutional: Negative.   HENT: Negative.   Eyes: Negative.   Respiratory: Negative.   Cardiovascular: Negative.   Gastrointestinal: Negative.   Endocrine: Negative.   Genitourinary: Positive for pelvic pain.  Musculoskeletal: Negative.   Skin: Negative.   Allergic/Immunologic: Negative.  Neurological: Negative.   Hematological: Negative.   Psychiatric/Behavioral: Negative.    Physical Exam   Blood pressure 122/71, pulse 74, temperature 98.4 F (36.9 C), temperature source Oral, resp. rate 18, height 5\' 3"  (1.6 m), weight 73 kg, last menstrual period 07/22/2019, SpO2 100 %.  Physical Exam  Nursing note and vitals reviewed. Constitutional: She is oriented to person, place, and time. She appears well-developed and well-nourished.  HENT:  Head: Normocephalic and atraumatic.  Eyes: Pupils are equal, round, and reactive to light.  Cardiovascular: Normal rate and regular rhythm.  Respiratory: Effort normal.  GI: Soft.  Genitourinary:    Genitourinary Comments: Uterus: non-tender, SE: cervix is smooth, pink, no lesions, moderate amt of thick, yellow-white vaginal d/c -- WP, GC/CT done, closed/long/firm, no CMT or friability, no adnexal tenderness    Musculoskeletal:        General: Normal range of motion.      Cervical back: Normal range of motion.  Neurological: She is alert and oriented to person, place, and time.  Skin: Skin is warm and dry.  Psychiatric: She has a normal mood and affect. Her behavior is normal. Judgment and thought content normal.    MAU Course  Procedures  MDM CCUA UPT CBC ABO/Rh HCG Wet Prep GC/CT -- pending HIV -- pending OB < 14 wks 09/21/2019 with TV Results for orders placed or performed during the hospital encounter of 09/22/19 (from the past 48 hour(s))  Pregnancy, urine POC     Status: Abnormal   Collection Time: 09/22/19  9:41 AM  Result Value Ref Range   Preg Test, Ur POSITIVE (A) NEGATIVE    Comment:        THE SENSITIVITY OF THIS METHODOLOGY IS >24 mIU/mL   Urinalysis, Routine w reflex microscopic     Status: Abnormal   Collection Time: 09/22/19  9:43 AM  Result Value Ref Range   Color, Urine STRAW (A) YELLOW   APPearance CLEAR CLEAR   Specific Gravity, Urine 1.009 1.005 - 1.030   pH 8.0 5.0 - 8.0   Glucose, UA NEGATIVE NEGATIVE mg/dL   Hgb urine dipstick NEGATIVE NEGATIVE   Bilirubin Urine NEGATIVE NEGATIVE   Ketones, ur NEGATIVE NEGATIVE mg/dL   Protein, ur NEGATIVE NEGATIVE mg/dL   Nitrite NEGATIVE NEGATIVE   Leukocytes,Ua NEGATIVE NEGATIVE    Comment: Performed at [facility], [address], [place], [place]  HIV Antibody (routine testing w rflx)     Status: None   Collection Time: 09/22/19 10:08 AM  Result Value Ref Range   HIV Screen 4th Generation wRfx Non Reactive Non Reactive    Comment: Performed at [facility], [address], [place], [place]  CBC     Status: Abnormal   Collection Time: 09/22/19 10:08 AM  Result Value Ref Range   WBC 9.2 4.0 - 10.5 K/uL   RBC 4.53 3.87 - 5.11 MIL/uL   Hemoglobin 12.5 12.0 - 15.0 g/dL   HCT 11/22/19 51.8 - 84.1 %   MCV 84.8 80.0 - 100.0 fL   MCH 27.6 26.0 - 34.0 pg   MCHC 32.6 30.0 - 36.0 g/dL   RDW 66.0 63.0 - 16.0 %   Platelets 402 (H) 150 - 400 K/uL   nRBC 0.0  0.0 - 0.2 %    Comment: Performed at [facility], [address], [place], [place]  ABO/Rh     Status: None   Collection Time: 09/22/19 10:08 AM  Result Value Ref Range   ABO/RH(D) O POS    No rh immune globuloin      NOT A RH IMMUNE GLOBULIN CANDIDATE, PT RH POSITIVE Performed at [facility], [address], [place], [place] [address]   hCG, quantitative, pregnancy     Status: Abnormal   Collection Time: 09/22/19 10:08 AM  Result Value Ref Range   hCG, Beta Chain, Quant, S 65,751 (H) <5 mIU/mL    Comment:          GEST. AGE      CONC.  (mIU/mL)   <=1 WEEK        5 - 50     2 WEEKS       50 - 500     3 WEEKS       100 - 10,000     4 WEEKS     1,000 - 30,000     5 WEEKS     3,500 - 115,000   6-8 WEEKS     12,000 - 270,000    12 WEEKS     15,000 - 220,000        FEMALE AND NON-PREGNANT FEMALE:     LESS THAN 5 mIU/mL Performed at [facility], [address] [place], [place] [address]   Wet prep, genital     Status: Abnormal   Collection Time: 09/22/19 10:20 AM   Specimen: Cervix  Result Value Ref Range   Yeast Wet Prep HPF POC NONE SEEN NONE SEEN   Trich, Wet Prep NONE SEEN NONE SEEN   Clue Cells Wet Prep HPF POC PRESENT (A) NONE SEEN   WBC, Wet Prep HPF POC FEW (A) NONE SEEN   Sperm NONE SEEN     Comment: Performed at [facility], [address] [place], [place] [address]   US OB LESS THAN 14 WEEKS WITH OB TRANSVAGINAL  Result Date: 09/22/2019 CLINICAL DATA:  Abdominal pain affecting first trimester of pregnancy, LMP 07/22/2019 EXAM: OBSTETRIC <14 WK [place] AND TRANSVAGINAL OB US TECHNIQUE: Both transabdominal and transvaginal ultrasound examinations were performed for complete evaluation of the gestation as well as the maternal uterus, adnexal regions, and pelvic cul-de-sac. Transvaginal technique was performed to assess early pregnancy. COMPARISON:  None FINDINGS: Intrauterine gestational sac: Present, single Yolk sac:  Present Embryo:   Present Cardiac Activity: Present Heart Rate: 166 bpm CRL:  22.7 mm   8 w   6 d                  [place] EDC: 04/27/2020 Subchorionic hemorrhage:  Small subchronic hemorrhage. Maternal uterus/adnexae: Uterus anteverted, otherwise unremarkable. RIGHT ovary normal size and morphology, 4.1 x 2.0 x 2.2 cm LEFT ovary normal size and morphology 4.0 x 2.4 x 2.3 cm No free pelvic fluid or adnexal masses. IMPRESSION: Single live intrauterine gestation at 8 weeks 6 days EGA by crown-rump length. Small subchronic hemorrhage. Electronically Signed   By: Ulyses Southward M.D.   On: 09/22/2019 11:41    Assessment and Plan  Abdominal pain affecting pregnancy  - Information provided on abdominal pain in pregnancy - Advised to take Tylenol 1000 mg po every 6 hours prn pain   Subchorionic hematoma in first trimester, single or unspecified fetus  - Information provided on [facility] - Return to MAU:  If you have heavier bleeding that soaks through more that 2 pads per hour for an hour or more  If you bleed so much that you  feel like you might pass out or you do pass out  If you have significant abdominal pain that is not improved with Tylenol 1000 mg every 6 hours as needed for pain  If you develop a fever > 100.5   Threatened miscarriage in early pregnancy - Information provided on threatened miscarriage   - Discharge patient - Advised to establish [facility] by 10-12 weeks - Patient verbalized an understanding of the plan of care and agrees.   Raelyn Mora, MSN, CNM 09/22/2019, 10:08 AM

## 2019-09-22 NOTE — Discharge Instructions (Signed)
Return to MAU:  If you have heavier bleeding that soaks through more that 2 pads per hour for an hour or more  If you bleed so much that you feel like you might pass out or you do pass out  If you have significant abdominal pain that is not improved with Tylenol 1000 mg every 6 hours as needed for pain  If you develop a fever > 100.5  ~~~~~~~~~~~~~~~~~~~~~~~~~~~~~~~~~~~~~~~~~~~~~~~~~~~~~~~~~~~~~~~~~~~~~~~~~  Center for [facility] Prenatal Care Providers          Center for [facility] Healthcare locations:  Hours may vary. Please call for an appointment  Center for [facility] [place]  [address]              [place], [place] [address]             [phone]  Center for [facility] Healthcare @ [facility]   [address]  [phone]  Center For [facility] Healthcare @ [facility]       [address] [phone]            Center for [facility] Healthcare @ [place]     [phone]          Center for [facility] Healthcare @ [facility]   [address] [phone]  Center for [facility] @ [facility]  [address] [phone]     Center for [facility] @ [facility]  [address]   [phone]  ~~~~~~~~~~~~~~~~~~~~~~~~~~~~~~~~~~~~~~~~~~~~~~~~~~~~~~~~~~~~~~~~~~~~~~~~~ Safe Medications in Pregnancy   Acne: Benzoyl Peroxide Salicylic Acid  Backache/Headache: Tylenol: 2 regular strength every 4 hours OR              2 Extra strength every 6 hours  Colds/Coughs/Allergies: Benadryl (alcohol free) 25 mg every 6 hours as needed Breath right strips Claritin Cepacol throat lozenges Chloraseptic throat spray Cold-Eeze- up to three times per day Cough drops, alcohol free Flonase (by prescription only) Guaifenesin Mucinex Robitussin DM (plain only, alcohol free) Saline nasal spray/drops Sudafed (pseudoephedrine) & Actifed ** use only after [redacted] weeks gestation  and if you do not have high blood pressure Tylenol Vicks Vaporub Zinc lozenges Zyrtec   Constipation: Colace Ducolax suppositories Fleet enema Glycerin suppositories Metamucil Milk of magnesia Miralax Senokot Smooth move tea  Diarrhea: Kaopectate Imodium A-D  *NO pepto Bismol  Hemorrhoids: Anusol Anusol HC Preparation H Tucks  Indigestion: Tums Maalox Mylanta Zantac  Pepcid  Insomnia: Benadryl (alcohol free) 25mg  every 6 hours as needed Tylenol PM Unisom, no Gelcaps  Leg Cramps: Tums MagGel  Nausea/Vomiting:  Bonine Dramamine Emetrol Ginger extract Sea bands Meclizine  Nausea medication to take during pregnancy:  Unisom (doxylamine succinate 25 mg tablets) Take one tablet daily at bedtime. If symptoms are not adequately controlled, the dose can be increased to a maximum recommended dose of two tablets daily (1/2 tablet in the morning, 1/2 tablet mid-afternoon and one at bedtime). Vitamin B6 100mg  tablets. Take one tablet twice a day (up to 200 mg per day).  Skin Rashes: Aveeno products Benadryl cream or 25mg  every 6 hours as needed Calamine Lotion 1% cortisone cream  Yeast infection: Gyne-lotrimin 7 Monistat 7   **If taking multiple medications, please check labels to avoid duplicating the same active ingredients **take medication as directed on the label ** Do not exceed 4000 mg of tylenol in 24 hours **Do not take medications that contain aspirin or ibuprofen

## 2019-09-22 NOTE — MAU Note (Signed)
Is about 8-9wks preg.  Started having extreme cramping this morning. Started in the middle now going out to the sides. No bleeding.  Has not been seen any where yet, 5+HPT.

## 2019-09-23 LAB — GC/CHLAMYDIA PROBE AMP (~~LOC~~) NOT AT ARMC
Chlamydia: NEGATIVE
Comment: NEGATIVE
Comment: NORMAL
Neisseria Gonorrhea: NEGATIVE

## 2019-10-12 ENCOUNTER — Inpatient Hospital Stay (HOSPITAL_COMMUNITY): Payer: Managed Care, Other (non HMO)

## 2019-10-12 ENCOUNTER — Encounter (HOSPITAL_COMMUNITY): Payer: Self-pay | Admitting: Obstetrics and Gynecology

## 2019-10-12 ENCOUNTER — Inpatient Hospital Stay (HOSPITAL_COMMUNITY)
Admission: AD | Admit: 2019-10-12 | Discharge: 2019-10-13 | Disposition: A | Discharge status: Home / Self Care | Payer: Managed Care, Other (non HMO) | Attending: Obstetrics and Gynecology | Admitting: Obstetrics and Gynecology

## 2019-10-12 ENCOUNTER — Other Ambulatory Visit: Payer: Self-pay

## 2019-10-12 DIAGNOSIS — Z3A11 11 weeks gestation of pregnancy: Secondary | ICD-10-CM | POA: Diagnosis not present

## 2019-10-12 DIAGNOSIS — E282 Polycystic ovarian syndrome: Secondary | ICD-10-CM | POA: Insufficient documentation

## 2019-10-12 DIAGNOSIS — O219 Vomiting of pregnancy, unspecified: Secondary | ICD-10-CM | POA: Diagnosis not present

## 2019-10-12 DIAGNOSIS — Z833 Family history of diabetes mellitus: Secondary | ICD-10-CM | POA: Insufficient documentation

## 2019-10-12 DIAGNOSIS — R1031 Right lower quadrant pain: Secondary | ICD-10-CM

## 2019-10-12 DIAGNOSIS — O99281 Endocrine, nutritional and metabolic diseases complicating pregnancy, first trimester: Secondary | ICD-10-CM | POA: Diagnosis not present

## 2019-10-12 DIAGNOSIS — Z3A12 12 weeks gestation of pregnancy: Secondary | ICD-10-CM | POA: Diagnosis not present

## 2019-10-12 DIAGNOSIS — O26891 Other specified pregnancy related conditions, first trimester: Secondary | ICD-10-CM | POA: Insufficient documentation

## 2019-10-12 DIAGNOSIS — O24111 Pre-existing diabetes mellitus, type 2, in pregnancy, first trimester: Secondary | ICD-10-CM | POA: Diagnosis not present

## 2019-10-12 DIAGNOSIS — R102 Pelvic and perineal pain: Secondary | ICD-10-CM

## 2019-10-12 LAB — WET PREP, GENITAL
Clue Cells Wet Prep HPF POC: NONE SEEN
Sperm: NONE SEEN
Trich, Wet Prep: NONE SEEN
Yeast Wet Prep HPF POC: NONE SEEN

## 2019-10-12 LAB — URINALYSIS, ROUTINE W REFLEX MICROSCOPIC
Bacteria, UA: NONE SEEN
Bilirubin Urine: NEGATIVE
Glucose, UA: NEGATIVE mg/dL
Ketones, ur: NEGATIVE mg/dL
Leukocytes,Ua: NEGATIVE
Nitrite: NEGATIVE
Protein, ur: NEGATIVE mg/dL
Specific Gravity, Urine: 1.006 (ref 1.005–1.030)
pH: 8 (ref 5.0–8.0)

## 2019-10-12 LAB — CBC WITH DIFFERENTIAL/PLATELET
Abs Immature Granulocytes: 0.03 10*3/uL (ref 0.00–0.07)
Basophils Absolute: 0 10*3/uL (ref 0.0–0.1)
Basophils Relative: 0 %
Eosinophils Absolute: 0.1 10*3/uL (ref 0.0–0.5)
Eosinophils Relative: 1 %
HCT: 35.6 % — ABNORMAL LOW (ref 36.0–46.0)
Hemoglobin: 11.5 g/dL — ABNORMAL LOW (ref 12.0–15.0)
Immature Granulocytes: 0 %
Lymphocytes Relative: 23 %
Lymphs Abs: 2.8 10*3/uL (ref 0.7–4.0)
MCH: 27 pg (ref 26.0–34.0)
MCHC: 32.3 g/dL (ref 30.0–36.0)
MCV: 83.6 fL (ref 80.0–100.0)
Monocytes Absolute: 0.8 10*3/uL (ref 0.1–1.0)
Monocytes Relative: 6 %
Neutro Abs: 8.7 10*3/uL — ABNORMAL HIGH (ref 1.7–7.7)
Neutrophils Relative %: 70 %
Platelets: 346 10*3/uL (ref 150–400)
RBC: 4.26 MIL/uL (ref 3.87–5.11)
RDW: 12.7 % (ref 11.5–15.5)
WBC: 12.4 10*3/uL — ABNORMAL HIGH (ref 4.0–10.5)
nRBC: 0 % (ref 0.0–0.2)

## 2019-10-12 LAB — COMPREHENSIVE METABOLIC PANEL
ALT: 14 U/L (ref 0–44)
AST: 16 U/L (ref 15–41)
Albumin: 3.7 g/dL (ref 3.5–5.0)
Alkaline Phosphatase: 37 U/L — ABNORMAL LOW (ref 38–126)
Anion gap: 9 (ref 5–15)
BUN: 8 mg/dL (ref 6–20)
CO2: 23 mmol/L (ref 22–32)
Calcium: 9.3 mg/dL (ref 8.9–10.3)
Chloride: 105 mmol/L (ref 98–111)
Creatinine, Ser: 0.56 mg/dL (ref 0.44–1.00)
GFR calc Af Amer: >60 mL/min (ref >60)
GFR calc non Af Amer: >60 mL/min (ref >60)
Glucose, Bld: 92 mg/dL (ref 70–99)
Potassium: 3.6 mmol/L (ref 3.5–5.1)
Sodium: 137 mmol/L (ref 135–145)
Total Bilirubin: 0.4 mg/dL (ref 0.3–1.2)
Total Protein: 6.5 g/dL (ref 6.5–8.1)

## 2019-10-12 MED ORDER — ACETAMINOPHEN 500 MG PO TABS
1000.0000 mg | ORAL_TABLET | Freq: Once | ORAL | Status: AC
  Administered 2019-10-12: 1000 mg via ORAL
  Filled 2019-10-12: qty 2

## 2019-10-12 NOTE — MAU Provider Note (Addendum)
History     CSN: [id number]  Arrival date and time: 10/12/19 [id number]   First Provider Initiated Contact with Patient 10/12/19 1949      Chief Complaint  Patient presents with  . Abdominal Pain   Roberta Lutz is a 24 y.o. G1P0 at [id number] who receives care at [facility].  She presents today for Abdominal Paint hat she describes as cramping and burning.  She states the symptoms started around 4pm while she was at work and she had to use the restroom.  She states that she experienced a very intense cramping and burning and thought she had to have a bowel movement.  Patient states that despite the bowel movement she continued to have the cramping and then had an onset of vomiting.  She states the cramping and burning is now intermittent and lasts ~30 seconds. She states the pain is improved with "being hunched over" and is aggravated by laying down straight.  She states she has not taken any medication for her symptoms. She states that the last time she had these symptoms she had a bacterial infection. She was recently treated for a BV infection and completed her script in it's entirety.  However, patient denies vaginal discharge as well as bleeding. Patient denies sexual activity in the past 3 days.    OB History    Gravida  1   Para      Term      Preterm      AB      Living        SAB      TAB      Ectopic      Multiple      Live Births              Past Medical History:  Diagnosis Date  . Diabetes mellitus without complication (HCC)    Type II  . PCOS (polycystic ovarian syndrome)   . Pilonidal cyst   . Rosacea     Past Surgical History:  Procedure Laterality Date  . DRUG INDUCED ENDOSCOPY     Gastritis    Family History  Problem Relation Age of Onset  . Diabetes Mother   . Diabetes Maternal Grandmother     Social History   Tobacco Use  . Smoking status: Never Smoker  . Smokeless tobacco: Never Used  Vaping Use  . Vaping Use: Never used  Substance  Use Topics  . Alcohol use: No  . Drug use: No    Allergies: No Known Allergies  No medications prior to admission.    Review of Systems  Constitutional: Negative for chills and fever.  Respiratory: Negative for cough and shortness of breath.   Gastrointestinal: Positive for abdominal pain, nausea and vomiting. Negative for constipation and diarrhea.  Genitourinary: Negative for difficulty urinating, dysuria, pelvic pain, vaginal bleeding and vaginal discharge.  Neurological: Positive for headaches (5-6/10-In front; throbbing). Negative for dizziness and light-headedness.   Physical Exam   Blood pressure 112/61, pulse 72, temperature 98.7 F (37.1 C), temperature source Oral, resp. rate 19, weight 76.7 kg, last menstrual period 07/22/2019.  Patient Vitals for the past 24 hrs:  BP Temp Temp src Pulse Resp Weight  10/13/19 0054 112/61 -- -- 72 -- --  10/12/19 1915 129/68 98.7 F (37.1 C) Oral 84 19 --  10/12/19 1911 -- -- -- -- -- 76.7 kg    Physical Exam Vitals and nursing note reviewed. Exam conducted with a chaperone present.  Constitutional:  Appearance: She is well-developed.  HENT:     Head: Normocephalic and atraumatic.  Eyes:     Pupils: Pupils are equal, round, and reactive to light.  Cardiovascular:     Rate and Rhythm: Normal rate and regular rhythm.     Heart sounds: Normal heart sounds.  Pulmonary:     Effort: No respiratory distress.     Breath sounds: Normal breath sounds.  Abdominal:     General: Bowel sounds are normal.     Palpations: Abdomen is soft.     Tenderness: There is abdominal tenderness in the suprapubic area. There is no guarding.  Genitourinary:    Vagina: No erythema or bleeding.     Cervix: No cervical motion tenderness, friability or erythema.     Uterus: Enlarged and tender.      Adnexa:        Right: Tenderness present.      Comments: Speculum Exam: -Normal External Genitalia: Non tender, no apparent discharge at introitus.   -Vaginal Vault: Pink mucosa with good rugae. Scant amt thin clear mucoid discharge -wet prep collected -Cervix:Pink, no lesions, cysts, or polyps.  Appears closed. No active bleeding from os-GC/CT collected -Bimanual Exam:  Tenderness at Fundus.  Tenderness in right adnexa area.  Uterine size c/w dates.   Skin:    General: Skin is warm and dry.  Neurological:     Mental Status: She is alert and oriented to person, place, and time.  Psychiatric:        Mood and Affect: Mood normal.    Results for orders placed or performed during the hospital encounter of 10/12/19 (from the past 24 hour(s))  Urinalysis, Routine w reflex microscopic     Status: Abnormal   Collection Time: 10/12/19  7:34 PM  Result Value Ref Range   Color, Urine STRAW (A) YELLOW   APPearance CLEAR CLEAR   Specific Gravity, Urine 1.006 1.005 - 1.030   pH 8.0 5.0 - 8.0   Glucose, UA NEGATIVE NEGATIVE mg/dL   Hgb urine dipstick SMALL (A) NEGATIVE   Bilirubin Urine NEGATIVE NEGATIVE   Ketones, ur NEGATIVE NEGATIVE mg/dL   Protein, ur NEGATIVE NEGATIVE mg/dL   Nitrite NEGATIVE NEGATIVE   Leukocytes,Ua NEGATIVE NEGATIVE   RBC / HPF 0-5 0 - 5 RBC/hpf   WBC, UA 0-5 0 - 5 WBC/hpf   Bacteria, UA NONE SEEN NONE SEEN   Squamous Epithelial / LPF 0-5 0 - 5  Wet prep, genital     Status: Abnormal   Collection Time: 10/12/19  8:07 PM  Result Value Ref Range   Yeast Wet Prep HPF POC NONE SEEN NONE SEEN   Trich, Wet Prep NONE SEEN NONE SEEN   Clue Cells Wet Prep HPF POC NONE SEEN NONE SEEN   WBC, Wet Prep HPF POC FEW (A) NONE SEEN   Sperm NONE SEEN   Comprehensive metabolic panel     Status: Abnormal   Collection Time: 10/12/19  9:25 PM  Result Value Ref Range   Sodium 137 135 - 145 mmol/L   Potassium 3.6 3.5 - 5.1 mmol/L   Chloride 105 98 - 111 mmol/L   CO2 23 22 - 32 mmol/L   Glucose, Bld 92 70 - 99 mg/dL   BUN 8 6 - 20 mg/dL   Creatinine, Ser 6.22 0.44 - 1.00 mg/dL   Calcium 9.3 8.9 - 29.7 mg/dL   Total Protein 6.5  6.5 - 8.1 g/dL   Albumin 3.7 3.5 - 5.0 g/dL  AST 16 15 - 41 U/L   ALT 14 0 - 44 U/L   Alkaline Phosphatase 37 (L) 38 - 126 U/L   Total Bilirubin 0.4 0.3 - 1.2 mg/dL   GFR calc non Af Amer >60 >60 mL/min   GFR calc Af Amer >60 >60 mL/min   Anion gap 9 5 - 15  CBC with Differential/Platelet     Status: Abnormal   Collection Time: 10/12/19  9:25 PM  Result Value Ref Range   WBC 12.4 (H) 4.0 - 10.5 K/uL   RBC 4.26 3.87 - 5.11 MIL/uL   Hemoglobin 11.5 (L) 12.0 - 15.0 g/dL   HCT 16.1 (L) 36 - 46 %   MCV 83.6 80.0 - 100.0 fL   MCH 27.0 26.0 - 34.0 pg   MCHC 32.3 30.0 - 36.0 g/dL   RDW 09.6 04.5 - 40.9 %   Platelets 346 150 - 400 K/uL   nRBC 0.0 0.0 - 0.2 %   Neutrophils Relative % 70 %   Neutro Abs 8.7 (H) 1.7 - 7.7 K/uL   Lymphocytes Relative 23 %   Lymphs Abs 2.8 0.7 - 4.0 K/uL   Monocytes Relative 6 %   Monocytes Absolute 0.8 0 - 1 K/uL   Eosinophils Relative 1 %   Eosinophils Absolute 0.1 0 - 0 K/uL   Basophils Relative 0 %   Basophils Absolute 0.0 0 - 0 K/uL   Immature Granulocytes 0 %   Abs Immature Granulocytes 0.03 0.00 - 0.07 K/uL   MR PELVIS WO CONTRAST  Result Date: 10/12/2019 CLINICAL DATA:  Right lower quadrant abdominal pain EXAM: MRI ABDOMEN AND PELVIS WITHOUT CONTRAST TECHNIQUE: Multiplanar multisequence MR imaging of the abdomen and pelvis was performed. No intravenous contrast was administered. COMPARISON:  10/12/2019 FINDINGS: COMBINED FINDINGS FOR BOTH MR ABDOMEN AND PELVIS Lower chest: No acute pleural or parenchymal lung disease. Hepatobiliary: No mass or other parenchymal abnormality identified. Pancreas: No mass, inflammatory changes, or other parenchymal abnormality identified. Spleen:  Within normal limits in size and appearance. Adrenals/Urinary Tract: No masses identified. No evidence of hydronephrosis. Stomach/Bowel: No bowel obstruction or ileus. No bowel wall thickening or inflammatory change. A normal appendix can be seen in the right lower quadrant,  reference images 19-20 of series 10 and images 6-8 of series 9. There are no inflammatory changes to suggest appendicitis. Vascular/Lymphatic: No pathologically enlarged lymph nodes identified. No abdominal aortic aneurysm demonstrated. Reproductive: Single intrauterine pregnancy is identified in variable presentation. Bilateral ovaries are normal in appearance. Other:  No evidence of free fluid or free gas. Musculoskeletal: No suspicious bone lesions identified. IMPRESSION: 1. Normal appendix in the right lower quadrant. 2. Single live intrauterine pregnancy in variable presentation. Please refer to preceding obstetric ultrasound for further details. 3. Otherwise unremarkable MRI of the abdomen and pelvis. Electronically Signed   By: Sharlet Salina M.D.   On: 10/12/2019 23:39   MR ABDOMEN WO CONTRAST  Result Date: 10/12/2019 CLINICAL DATA:  Right lower quadrant abdominal pain EXAM: MRI ABDOMEN AND PELVIS WITHOUT CONTRAST TECHNIQUE: Multiplanar multisequence MR imaging of the abdomen and pelvis was performed. No intravenous contrast was administered. COMPARISON:  10/12/2019 FINDINGS: COMBINED FINDINGS FOR BOTH MR ABDOMEN AND PELVIS Lower chest: No acute pleural or parenchymal lung disease. Hepatobiliary: No mass or other parenchymal abnormality identified. Pancreas: No mass, inflammatory changes, or other parenchymal abnormality identified. Spleen:  Within normal limits in size and appearance. Adrenals/Urinary Tract: No masses identified. No evidence of hydronephrosis. Stomach/Bowel: No bowel obstruction or ileus.  No bowel wall thickening or inflammatory change. A normal appendix can be seen in the right lower quadrant, reference images 19-20 of series 10 and images 6-8 of series 9. There are no inflammatory changes to suggest appendicitis. Vascular/Lymphatic: No pathologically enlarged lymph nodes identified. No abdominal aortic aneurysm demonstrated. Reproductive: Single intrauterine pregnancy is identified in  variable presentation. Bilateral ovaries are normal in appearance. Other:  No evidence of free fluid or free gas. Musculoskeletal: No suspicious bone lesions identified. IMPRESSION: 1. Normal appendix in the right lower quadrant. 2. Single live intrauterine pregnancy in variable presentation. Please refer to preceding obstetric ultrasound for further details. 3. Otherwise unremarkable MRI of the abdomen and pelvis. Electronically Signed   By: Randa Ngo M.D.   On: 10/12/2019 23:39   US OB Comp Less 14 Wks  Result Date: 10/12/2019 CLINICAL DATA:  Pregnant patient at 11 weeks pregnancy with left adnexal tenderness. EXAM: OBSTETRIC <14 WK ULTRASOUND TECHNIQUE: Transabdominal ultrasound was performed for evaluation of the gestation as well as the maternal uterus and adnexal regions. COMPARISON:  Obstetric ultrasound 09/22/2019 FINDINGS: Intrauterine gestational sac: Single Yolk sac:  Not Visualized. Embryo:  Visualized. Cardiac Activity: Visualized. Heart Rate: 174 bpm CRL:   56.2 mm   12 w 1 d                  [place] EDC: 04/24/2020 Subchorionic hemorrhage: Previous small subchorionic hemorrhage has resolved. Maternal uterus/adnexae: The left ovary is visualized and is normal measuring 4.1 x 2.0 x 1.7 cm. The right ovary is visualized and is normal measuring 4.0 x 2.5 x 1.9 cm. There is no adnexal mass or pelvic free fluid. IMPRESSION: 1. Single live intrauterine pregnancy estimated gestational age [redacted] weeks 1 day based on crown-rump length for ultrasound [facility] 04/24/2020. There has been interval fetal growth since prior exam. 2. Previous subchorionic hemorrhage has resolved. 3. Normal sonographic appearance of both ovaries. No explanation for adnexal tenderness. Electronically Signed   By: Keith Rake M.D.   On: 10/12/2019 20:37   US OB LESS THAN 14 WEEKS WITH OB TRANSVAGINAL  Result Date: 09/22/2019 CLINICAL DATA:  Abdominal pain affecting first trimester of pregnancy, LMP 07/22/2019 EXAM: OBSTETRIC <14 WK  [place] AND TRANSVAGINAL OB US TECHNIQUE: Both transabdominal and transvaginal ultrasound examinations were performed for complete evaluation of the gestation as well as the maternal uterus, adnexal regions, and pelvic cul-de-sac. Transvaginal technique was performed to assess early pregnancy. COMPARISON:  None FINDINGS: Intrauterine gestational sac: Present, single Yolk sac:  Present Embryo:  Present Cardiac Activity: Present Heart Rate: 166 bpm CRL:  22.7 mm   8 w   6 d                  [place] EDC: 04/27/2020 Subchorionic hemorrhage:  Small subchronic hemorrhage. Maternal uterus/adnexae: Uterus anteverted, otherwise unremarkable. RIGHT ovary normal size and morphology, 4.1 x 2.0 x 2.2 cm LEFT ovary normal size and morphology 4.0 x 2.4 x 2.3 cm No free pelvic fluid or adnexal masses. IMPRESSION: Single live intrauterine gestation at 8 weeks 6 days EGA by crown-rump length. Small subchronic hemorrhage. Electronically Signed   By: Lavonia Dana M.D.   On: 09/22/2019 11:41    MAU Course  Procedures Results for orders placed or performed during the hospital encounter of 10/12/19 (from the past 24 hour(s))  Urinalysis, Routine w reflex microscopic     Status: Abnormal   Collection Time: 10/12/19  7:34 PM  Result Value Ref Range   Color, Urine  STRAW (A) YELLOW   APPearance CLEAR CLEAR   Specific Gravity, Urine 1.006 1.005 - 1.030   pH 8.0 5.0 - 8.0   Glucose, UA NEGATIVE NEGATIVE mg/dL   Hgb urine dipstick SMALL (A) NEGATIVE   Bilirubin Urine NEGATIVE NEGATIVE   Ketones, ur NEGATIVE NEGATIVE mg/dL   Protein, ur NEGATIVE NEGATIVE mg/dL   Nitrite NEGATIVE NEGATIVE   Leukocytes,Ua NEGATIVE NEGATIVE   RBC / HPF 0-5 0 - 5 RBC/hpf   WBC, UA 0-5 0 - 5 WBC/hpf   Bacteria, UA NONE SEEN NONE SEEN   Squamous Epithelial / LPF 0-5 0 - 5  Wet prep, genital     Status: Abnormal   Collection Time: 10/12/19  8:07 PM  Result Value Ref Range   Yeast Wet Prep HPF POC NONE SEEN NONE SEEN   Trich, Wet Prep NONE SEEN NONE SEEN    Clue Cells Wet Prep HPF POC NONE SEEN NONE SEEN   WBC, Wet Prep HPF POC FEW (A) NONE SEEN   Sperm NONE SEEN   Comprehensive metabolic panel     Status: Abnormal   Collection Time: 10/12/19  9:25 PM  Result Value Ref Range   Sodium 137 135 - 145 mmol/L   Potassium 3.6 3.5 - 5.1 mmol/L   Chloride 105 98 - 111 mmol/L   CO2 23 22 - 32 mmol/L   Glucose, Bld 92 70 - 99 mg/dL   BUN 8 6 - 20 mg/dL   Creatinine, Ser 2.69 0.44 - 1.00 mg/dL   Calcium 9.3 8.9 - 48.5 mg/dL   Total Protein 6.5 6.5 - 8.1 g/dL   Albumin 3.7 3.5 - 5.0 g/dL   AST 16 15 - 41 U/L   ALT 14 0 - 44 U/L   Alkaline Phosphatase 37 (L) 38 - 126 U/L   Total Bilirubin 0.4 0.3 - 1.2 mg/dL   GFR calc non Af Amer >60 >60 mL/min   GFR calc Af Amer >60 >60 mL/min   Anion gap 9 5 - 15  CBC with Differential/Platelet     Status: Abnormal   Collection Time: 10/12/19  9:25 PM  Result Value Ref Range   WBC 12.4 (H) 4.0 - 10.5 K/uL   RBC 4.26 3.87 - 5.11 MIL/uL   Hemoglobin 11.5 (L) 12.0 - 15.0 g/dL   HCT 46.2 (L) 36 - 46 %   MCV 83.6 80.0 - 100.0 fL   MCH 27.0 26.0 - 34.0 pg   MCHC 32.3 30.0 - 36.0 g/dL   RDW 70.3 50.0 - 93.8 %   Platelets 346 150 - 400 K/uL   nRBC 0.0 0.0 - 0.2 %   Neutrophils Relative % 70 %   Neutro Abs 8.7 (H) 1.7 - 7.7 K/uL   Lymphocytes Relative 23 %   Lymphs Abs 2.8 0.7 - 4.0 K/uL   Monocytes Relative 6 %   Monocytes Absolute 0.8 0 - 1 K/uL   Eosinophils Relative 1 %   Eosinophils Absolute 0.1 0 - 0 K/uL   Basophils Relative 0 %   Basophils Absolute 0.0 0 - 0 K/uL   Immature Granulocytes 0 %   Abs Immature Granulocytes 0.03 0.00 - 0.07 K/uL   MR PELVIS WO CONTRAST  Result Date: 10/12/2019 CLINICAL DATA:  Right lower quadrant abdominal pain EXAM: MRI ABDOMEN AND PELVIS WITHOUT CONTRAST TECHNIQUE: Multiplanar multisequence MR imaging of the abdomen and pelvis was performed. No intravenous contrast was administered. COMPARISON:  10/12/2019 FINDINGS: COMBINED FINDINGS FOR BOTH MR ABDOMEN AND PELVIS  Lower chest: No acute pleural or parenchymal lung disease. Hepatobiliary: No mass or other parenchymal abnormality identified. Pancreas: No mass, inflammatory changes, or other parenchymal abnormality identified. Spleen:  Within normal limits in size and appearance. Adrenals/Urinary Tract: No masses identified. No evidence of hydronephrosis. Stomach/Bowel: No bowel obstruction or ileus. No bowel wall thickening or inflammatory change. A normal appendix can be seen in the right lower quadrant, reference images 19-20 of series 10 and images 6-8 of series 9. There are no inflammatory changes to suggest appendicitis. Vascular/Lymphatic: No pathologically enlarged lymph nodes identified. No abdominal aortic aneurysm demonstrated. Reproductive: Single intrauterine pregnancy is identified in variable presentation. Bilateral ovaries are normal in appearance. Other:  No evidence of free fluid or free gas. Musculoskeletal: No suspicious bone lesions identified. IMPRESSION: 1. Normal appendix in the right lower quadrant. 2. Single live intrauterine pregnancy in variable presentation. Please refer to preceding obstetric ultrasound for further details. 3. Otherwise unremarkable MRI of the abdomen and pelvis. Electronically Signed   By: Sharlet Salina M.D.   On: 10/12/2019 23:39   MR ABDOMEN WO CONTRAST  Result Date: 10/12/2019 CLINICAL DATA:  Right lower quadrant abdominal pain EXAM: MRI ABDOMEN AND PELVIS WITHOUT CONTRAST TECHNIQUE: Multiplanar multisequence MR imaging of the abdomen and pelvis was performed. No intravenous contrast was administered. COMPARISON:  10/12/2019 FINDINGS: COMBINED FINDINGS FOR BOTH MR ABDOMEN AND PELVIS Lower chest: No acute pleural or parenchymal lung disease. Hepatobiliary: No mass or other parenchymal abnormality identified. Pancreas: No mass, inflammatory changes, or other parenchymal abnormality identified. Spleen:  Within normal limits in size and appearance. Adrenals/Urinary Tract: No  masses identified. No evidence of hydronephrosis. Stomach/Bowel: No bowel obstruction or ileus. No bowel wall thickening or inflammatory change. A normal appendix can be seen in the right lower quadrant, reference images 19-20 of series 10 and images 6-8 of series 9. There are no inflammatory changes to suggest appendicitis. Vascular/Lymphatic: No pathologically enlarged lymph nodes identified. No abdominal aortic aneurysm demonstrated. Reproductive: Single intrauterine pregnancy is identified in variable presentation. Bilateral ovaries are normal in appearance. Other:  No evidence of free fluid or free gas. Musculoskeletal: No suspicious bone lesions identified. IMPRESSION: 1. Normal appendix in the right lower quadrant. 2. Single live intrauterine pregnancy in variable presentation. Please refer to preceding obstetric ultrasound for further details. 3. Otherwise unremarkable MRI of the abdomen and pelvis. Electronically Signed   By: Sharlet Salina M.D.   On: 10/12/2019 23:39   US OB Comp Less 14 Wks  Result Date: 10/12/2019 CLINICAL DATA:  Pregnant patient at 11 weeks pregnancy with left adnexal tenderness. EXAM: OBSTETRIC <14 WK ULTRASOUND TECHNIQUE: Transabdominal ultrasound was performed for evaluation of the gestation as well as the maternal uterus and adnexal regions. COMPARISON:  Obstetric ultrasound 09/22/2019 FINDINGS: Intrauterine gestational sac: Single Yolk sac:  Not Visualized. Embryo:  Visualized. Cardiac Activity: Visualized. Heart Rate: 174 bpm CRL:   56.2 mm   12 w 1 d                  [place] EDC: 04/24/2020 Subchorionic hemorrhage: Previous small subchorionic hemorrhage has resolved. Maternal uterus/adnexae: The left ovary is visualized and is normal measuring 4.1 x 2.0 x 1.7 cm. The right ovary is visualized and is normal measuring 4.0 x 2.5 x 1.9 cm. There is no adnexal mass or pelvic free fluid. IMPRESSION: 1. Single live intrauterine pregnancy estimated gestational age [redacted] weeks 1 day based on  crown-rump length for ultrasound [facility] 04/24/2020. There has been interval fetal growth since  prior exam. 2. Previous subchorionic hemorrhage has resolved. 3. Normal sonographic appearance of both ovaries. No explanation for adnexal tenderness. Electronically Signed   By: Narda Rutherford M.D.   On: 10/12/2019 20:37    MDM Physical Exam with pelvic Labs: UA, Wet Prep Ultrasound Assessment and Plan  24 year old, G1P0  SIUP at 11.5weeks Abdominal Tenderness Right Side Adnexa Tenderness  -Reviewed POC with patient. -Exam performed and findings discussed.  -Wet prep collected. -No apparent tenderness on right side of abdomen, but intense pain with BME particular to right adnexa area.  -Will give tylenol for pain. -Will send for TVUS to evaluate ovaries.  Cherre Robins 10/12/2019, 7:49 PM   Reassessment (9:01 PM)  -Results as above. -Provider to bedside to discuss US findings and negative WP. -Patient reports pain has increased since [place]. -Exam now with tenderness in RLQ with palpation.  -Patient informed that she will need additional testing to r/o appendicitis. -CBC with Diff and CMP ordered.  -Of note UA with some small HgB. -MRI ordered. -Report given to N. Lajoy Vanamburg, NP for assumption of care.  Cherre Robins MSN, CNM Advanced Practice Provider, [facility]  UA: straw/sm hgb, otherwise WNL, sending urine for culture CBC w/ Diff: WBCs 12.4 (up from 9.2 2 wks ago), H/H 11.5/35.6 (down 1pt since 2 weeks ago) CMP: no abnormalities requiring treatment WetPrep: WNL GC/CT collected two weeks ago, WNL -urine culture sent -[place]: single IUP, FHR 174, [redacted]w[redacted]d, previous SCH resolved -MRI: normal appendix visualized, normal ovaries -reassessment of pain at 11:47PM, patient states pain is currently 0, unless palpated, when it is 7/10. Patient denies pain elsewhere in body at this time as well and is requesting to leave as she has work in the morning. -consulted with Dr.  Earlene Plater, is suspicious for brewing appendicitis, and states patient needs to be seen in office in two days, will message [facility] where patient has NOB visit scheduled for 10/26/2019, message sent with high importance -discussed with patient results of MRI, clinical picture, suspicion for brewing appendicitis and s/sx of worsening infection. Patient denies history of hernia or abdominal or pelvic surgery. -return MAU precautions discussed -pt discharged to home with boyfriend in stable condition  1. RLQ abdominal pain   2. Left adnexal tenderness   3. [redacted] weeks gestation of pregnancy    Allergies as of 10/13/2019   No Known Allergies     Medication List    TAKE these medications   acetaminophen 650 MG CR tablet Commonly known as: TYLENOL Take 650 mg by mouth every 8 (eight) hours as needed for pain.   Melatonin 10 MG Caps Take 10 mg by mouth at bedtime as needed (sleep).   metroNIDAZOLE 500 MG tablet Commonly known as: FLAGYL Take 1 tablet (500 mg total) by mouth 2 (two) times daily.   ondansetron 4 MG disintegrating tablet Commonly known as: Zofran ODT  ODT q4 hours prn nausea/vomit   ondansetron 4 MG tablet Commonly known as: ZOFRAN Take 4 mg by mouth every 6 (six) hours as needed for nausea/vomiting.   promethazine 25 MG suppository Commonly known as: PHENERGAN Place 1 suppository (25 mg total) rectally every 6 (six) hours as needed for nausea or vomiting.   sucralfate 1 g tablet Commonly known as: CARAFATE Take 1 g by mouth 3 (three) times daily before meals.       Ladarius Seubert, Odie Sera, NP  1:30 AM 10/13/2019

## 2019-10-12 NOTE — MAU Note (Signed)
Pt reports to MAU c/o lower abdominal pain that started around 1600. Pt states it was extreme cramping that caused her to poop and vomit. Pt states it has continued to cramp and she is also c/o burning and a numb like feeling. Pt denies bleeding and no discharge. Pt reports last time this happened she had a bacterial infection.

## 2019-10-13 DIAGNOSIS — O26891 Other specified pregnancy related conditions, first trimester: Secondary | ICD-10-CM | POA: Diagnosis not present

## 2019-10-13 DIAGNOSIS — O219 Vomiting of pregnancy, unspecified: Secondary | ICD-10-CM

## 2019-10-13 DIAGNOSIS — Z3A11 11 weeks gestation of pregnancy: Secondary | ICD-10-CM | POA: Diagnosis not present

## 2019-10-13 DIAGNOSIS — R1031 Right lower quadrant pain: Secondary | ICD-10-CM | POA: Diagnosis not present

## 2019-10-13 NOTE — Discharge Instructions (Signed)
Abdominal Pain During Pregnancy  Abdominal pain is common during pregnancy, and has many possible causes. Some causes are more serious than others, and sometimes the cause is not known. Abdominal pain can be a sign that labor is starting. It can also be caused by normal growth and stretching of muscles and ligaments during pregnancy. Always tell your health care provider if you have any abdominal pain. Follow these instructions at home:  Do not have sex or put anything in your vagina until your pain goes away completely.  Get plenty of rest until your pain improves.  Drink enough fluid to keep your urine pale yellow.  Take over-the-counter and prescription medicines only as told by your health care provider.  Keep all follow-up visits as told by your health care provider. This is important. Contact a health care provider if:  Your pain continues or gets worse after resting.  You have lower abdominal pain that: ? Comes and goes at regular intervals. ? Spreads to your back. ? Is similar to menstrual cramps.  You have pain or burning when you urinate. Get help right away if:  You have a fever or chills.  You have vaginal bleeding.  You are leaking fluid from your vagina.  You are passing tissue from your vagina.  You have vomiting or diarrhea that lasts for more than 24 hours.  Your baby is moving less than usual.  You feel very weak or faint.  You have shortness of breath.  You develop severe pain in your upper abdomen. Summary  Abdominal pain is common during pregnancy, and has many possible causes.  If you experience abdominal pain during pregnancy, tell your health care provider right away.  Follow your health care provider's home care instructions and keep all follow-up visits as directed. This information is not intended to replace advice given to you by your health care provider. Make sure you discuss any questions you have with your health care  provider. Document Revised: 07/21/2018 Document Reviewed: 07/05/2016 Elsevier Patient Education  [place].        Appendicitis, Adult  Appendicitis is inflammation of the appendix. The appendix is a finger-shaped tube that is attached to the large intestine. If appendicitis is not treated, it can cause the appendix to tear (rupture). A ruptured appendix can lead to a life-threatening infection. It can also cause a painful collection of pus (abscess) to form in the appendix. What are the causes? This condition may be caused by a blockage in the appendix that leads to infection. The blockage can be caused by:  A ball of stool (feces).  Enlarged lymph glands. In some cases, the cause may not be known. What increases the risk? Age is a risk factor. You are more likely to develop this condition if you are between 9 and 4 years of age. What are the signs or symptoms? Symptoms of this condition include:  Pain that starts around the belly button and moves toward the lower right part of the abdomen. The pain can become more severe as time passes. It gets worse with coughing or sudden movements.  Tenderness in the lower right abdomen.  Nausea.  Vomiting.  Loss of appetite.  Fever.  Difficulty passing stool (constipation).  Passing very loose stools (diarrhea).  Generally feeling unwell. How is this diagnosed? This condition may be diagnosed with:  A physical exam.  Blood tests.  Urine test. To confirm the diagnosis, an ultrasound, MRI, or CT scan may be done. How is  this treated? This condition is usually treated with surgery to remove the appendix (appendectomy). There are two methods for doing an appendectomy:  Open appendectomy. In this surgery, the appendix is removed through a large incision that is made in the lower right abdomen. This procedure may be recommended if: ? You have major scarring from a previous surgery. ? You have a bleeding  disorder. ? You are pregnant and are about to give birth. ? You have a condition that makes it hard to do surgery through small incisions (laparoscopic procedure). This includes severe infection or a ruptured appendix.  Laparoscopic appendectomy. In this surgery, the appendix is removed through small incisions. This procedure usually causes less pain and fewer problems than an open appendectomy. It also has a shorter recovery time. If the appendix has ruptured and an abscess has formed:  A drain may be placed into the abscess to remove fluid.  Antibiotic medicines may be given through an IV.  The appendix may or may not need to be removed. Follow these instructions at home: If you had surgery, follow instructions from your health care provider about how to care for yourself at home and how to care for your incision. Medicines  Take over-the-counter and prescription medicines only as told by your health care provider.  If you were prescribed an antibiotic medicine, take it as told by your health care provider. Do not stop taking the antibiotic even if you start to feel better. Eating and drinking  Follow instructions from your health care provider about eating restrictions. You may slowly resume a regular diet once your nausea or vomiting stops. General instructions  Do not use any products that contain nicotine or tobacco, such as cigarettes, e-cigarettes, and chewing tobacco. If you need help quitting, ask your health care provider.  Do not drive or use heavy machinery while taking prescription pain medicine.  Ask your health care provider if the medicine prescribed to you can cause constipation. You may need to take steps to prevent or treat constipation, such as: ? Drink enough fluid to keep your urine pale yellow. ? Take over-the-counter or prescription medicines. ? Eat foods that are high in fiber, such as beans, whole grains, and fresh fruits and vegetables. ? Limit foods that  are high in fat and processed sugars, such as fried or sweet foods.  Keep all follow-up visits as told by your health care provider. This is important. Contact a health care provider if:  There is pus, blood, or excessive drainage coming from your incision.  You have nausea or vomiting. Get help right away if you have:  Worsening abdominal pain.  A fever.  Chills.  Fatigue.  Muscle aches.  Shortness of breath. Summary  Appendicitis is inflammation of the appendix.  This condition may be caused by a blockage in the appendix that leads to infection.  This condition is usually treated with surgery to remove the appendix. This information is not intended to replace advice given to you by your health care provider. Make sure you discuss any questions you have with your health care provider. Document Revised: 09/18/2017 Document Reviewed: 09/18/2017 Elsevier Patient Education  2020 [employer]                        Safe Medications in Pregnancy    Acne: Benzoyl Peroxide Salicylic Acid  Backache/Headache: Tylenol: 2 regular strength every 4 hours OR  hours OR              2 Extra strength every 6 hours  Colds/Coughs/Allergies: Benadryl (alcohol free) 25 mg every 6 hours as needed Breath right strips Claritin Cepacol throat lozenges Chloraseptic throat spray Cold-Eeze- up to three times per day Cough drops, alcohol free Flonase (by prescription only) Guaifenesin Mucinex Robitussin DM (plain only, alcohol free) Saline nasal spray/drops Sudafed (pseudoephedrine) & Actifed ** use only after [redacted] weeks gestation and if you do not have high blood pressure Tylenol Vicks Vaporub Zinc lozenges Zyrtec   Constipation: Colace Ducolax suppositories Fleet enema Glycerin suppositories Metamucil Milk of magnesia Miralax Senokot Smooth move tea  Diarrhea: Kaopectate Imodium A-D  *NO pepto Bismol  Hemorrhoids: Anusol Anusol HC Preparation  H Tucks  Indigestion: Tums Maalox Mylanta Zantac  Pepcid  Insomnia: Benadryl (alcohol free) 25mg every 6 hours as needed Tylenol PM Unisom, no Gelcaps  Leg Cramps: Tums MagGel  Nausea/Vomiting:  Bonine Dramamine Emetrol Ginger extract Sea bands Meclizine  Nausea medication to take during pregnancy:  Unisom (doxylamine succinate 25 mg tablets) Take one tablet daily at bedtime. If symptoms are not adequately controlled, the dose can be increased to a maximum recommended dose of two tablets daily (1/2 tablet in the morning, 1/2 tablet mid-afternoon and one at bedtime). Vitamin B6 100mg tablets. Take one tablet twice a day (up to 200 mg per day).  Skin Rashes: Aveeno products Benadryl cream or 25mg every 6 hours as needed Calamine Lotion 1% cortisone cream  Yeast infection: Gyne-lotrimin 7 Monistat 7   **If taking multiple medications, please check labels to avoid duplicating the same active ingredients **take medication as directed on the label ** Do not exceed 4000 mg of tylenol in 24 hours **Do not take medications that contain aspirin or ibuprofen     

## 2019-10-14 ENCOUNTER — Ambulatory Visit (INDEPENDENT_AMBULATORY_CARE_PROVIDER_SITE_OTHER): Payer: Managed Care, Other (non HMO) | Admitting: Obstetrics and Gynecology

## 2019-10-14 ENCOUNTER — Other Ambulatory Visit: Payer: Self-pay

## 2019-10-14 VITALS — BP 117/73 | HR 87 | Ht 63.0 in | Wt 170.0 lb

## 2019-10-14 DIAGNOSIS — O26899 Other specified pregnancy related conditions, unspecified trimester: Secondary | ICD-10-CM

## 2019-10-14 DIAGNOSIS — R109 Unspecified abdominal pain: Secondary | ICD-10-CM | POA: Diagnosis not present

## 2019-10-14 LAB — CULTURE, OB URINE

## 2019-10-14 NOTE — Progress Notes (Signed)
   Subjective:    Patient ID: Roberta Lutz, female    DOB: [birth year], 24 y.o.   MRN: [id number] CC: abdominal pain and pregnant Pt seen as f/u from [facility] on 10/12/2019 with lower abdominal pain.  IUP was noted, but pain profile was questionable for appendicitis.  Pt had MRI and u/s which showed no acute pathology.  Appendix was read as normal, but pt did have slightly elevated CBC.  Today pt states her pain is much improved.  She is eating and drinking well.  She denies nausea/vomiting along with no fever or chills.  Pain is much decreased in the RLQ.       Review of Systems  Constitutional: Negative.   HENT: Negative.   Eyes: Negative.   Respiratory: Negative.   Cardiovascular: Negative.   Gastrointestinal: Negative for abdominal pain, nausea and vomiting.       Good appetite  Endocrine: Negative.   Genitourinary: Negative.   Musculoskeletal: Negative.   Skin: Negative.   Allergic/Immunologic: Negative.   Neurological: Negative.   Hematological: Negative.   Psychiatric/Behavioral: Negative.        Objective:   Physical Exam Constitutional:      Appearance: Normal appearance.  HENT:     Head: Normocephalic and atraumatic.  Eyes:     Extraocular Movements: Extraocular movements intact.  Abdominal:     General: Abdomen is flat. There is no distension.     Palpations: Abdomen is soft.  Skin:    General: Skin is warm and dry.  Neurological:     Mental Status: She is alert.  Psychiatric:        Mood and Affect: Mood normal.        Behavior: Behavior normal.        Thought Content: Thought content normal.        Judgment: Judgment normal.   very mild RLQ tenderness, no rebounding or guarding.        Assessment & Plan:   1. Abdominal pain affecting pregnancy Will check cbc to follow wbc.   Current exam does not support overt appendicitis Pt given appendicitis precautions.  She is to return to the ED if there is worsening abdominal pain, fever chills or abnormal  nausea. Keep nursing appointment and new ob appointment.  Continue PNV - CBC

## 2019-10-14 NOTE — Progress Notes (Signed)
Pt is in office for f/u ED visit.  Pt was seen for RLQ pain- scans were clear.  Pt states was to f/u for ? Repeat CBC for white count.   Pt is currently scheduled for [facility] appt in July.

## 2019-10-14 NOTE — Patient Instructions (Signed)
Appendicitis, Adult  The appendix is a tube in the body that is shaped like a finger. It is attached to the large intestine. Appendicitis means that this tube is swollen (inflamed). If this is not treated, the tube can tear (rupture). This can lead to a life-threatening infection. This condition can also cause pus to build up in the appendix (abscess). What are the causes? This condition may be caused by something that blocks the appendix. These include:  A ball of poop (stool).  Lymph glands that are bigger than normal. Sometimes the cause is not known. What increases the risk? You are more likely to develop this condition if you are between 10 and 30 years of age. What are the signs or symptoms? Symptoms of this condition include:  Pain around the belly button (navel). ? The pain moves toward the lower right belly (abdomen). ? The pain can get worse with time. ? The pain can get worse if you cough. ? The pain can get worse if you move suddenly.  Tenderness in the lower right belly.  Feeling sick to your stomach (nauseous).  Throwing up (vomiting).  Not feeling hungry (loss of appetite).  A fever.  Having trouble pooping (constipation).  Watery poop (diarrhea).  Not feeling well. How is this treated? Most often, this condition is treated by taking out the appendix (appendectomy). There are two ways to do this:  Open surgery. For this method, the appendix is taken out through a large cut (incision). The cut is made in the lower right belly. This surgery may be used if: ? You have scars from another surgery. ? You have a bleeding condition. ? You are pregnant and will be having your baby soon. ? You have a condition that makes it hard to do the other type of surgery.  Laparoscopic surgery. For this method, the appendix is taken out through small cuts. Often, this surgery: ? Causes less pain. ? Causes fewer problems. ? Is easier to heal from. If your appendix tears and  pus forms:  A drain may be put into the sore. The drain will be used to get rid of the pus.  You may get an antibiotic medicine through an IV line.  Your appendix may or may not need to be taken out. Follow these instructions at home: If you had surgery, follow instructions from your doctor on how to care for yourself at home and how to take care of your cut from surgery. Medicines  Take over-the-counter and prescription medicines only as told by your doctor.  If you were prescribed an antibiotic medicine, take it as told by your doctor. Do not stop taking the antibiotic even if you start to feel better. Eating and drinking Follow instructions from your doctor about what you cannot eat or drink. You may go back to your diet slowly if:  You no longer feel sick to your stomach.  You have stopped throwing up. General instructions  Do not use any products that contain nicotine or tobacco, such as cigarettes, e-cigarettes, and chewing tobacco. If you need help quitting, ask your doctor.  Do not drive or use heavy machinery while taking prescription pain medicine.  Ask your doctor if the medicine you are taking can cause trouble pooping. You may need to take steps to prevent or treat trouble pooping: ? Drink enough fluid to keep your pee (urine) pale yellow. ? Take over-the-counter or prescription medicines. ? Eat foods that are high in fiber. These include beans,   whole grains, and fresh fruits and vegetables. ? Limit foods that are high in fat and sugar. These include fried or sweet foods.  Keep all follow-up visits as told by your doctor. This is important. Contact a doctor if:  There is pus, blood, or a lot of fluid coming from your cut or cuts from surgery.  You are sick to your stomach or you throw up. Get help right away if:  You have pain in your belly, and the pain is getting worse.  You have a fever.  You have chills.  You are very tired.  You have muscle  pain.  You are short of breath. Summary  Appendicitis is swelling of the appendix. The appendix is a tube that is shaped like a finger. It is joined to the large intestine.  This condition may be caused by something that blocks the appendix. This can lead to an infection.  This condition is usually treated by taking out the appendix. This information is not intended to replace advice given to you by your health care provider. Make sure you discuss any questions you have with your health care provider. Document Revised: 09/18/2017 Document Reviewed: 09/18/2017 Elsevier Patient Education  2020 [employer]. First Trimester of Pregnancy  The first trimester of pregnancy is from week 1 until the end of week 13 (months 1 through 3). During this time, your baby will begin to develop inside you. At 6-8 weeks, the eyes and face are formed, and the heartbeat can be seen on ultrasound. At the end of 12 weeks, all the baby's organs are formed. Prenatal care is all the medical care you receive before the birth of your baby. Make sure you get good prenatal care and follow all of your doctor's instructions. Follow these instructions at home: Medicines  Take over-the-counter and prescription medicines only as told by your doctor. Some medicines are safe and some medicines are not safe during pregnancy.  Take a prenatal vitamin that contains at least 600 micrograms (mcg) of folic acid.  If you have trouble pooping (constipation), take medicine that will make your stool soft (stool softener) if your doctor approves. Eating and drinking   Eat regular, healthy meals.  Your doctor will tell you the amount of weight gain that is right for you.  Avoid raw meat and uncooked cheese.  If you feel sick to your stomach (nauseous) or throw up (vomit): ? Eat 4 or 5 small meals a day instead of 3 large meals. ? Try eating a few soda crackers. ? Drink liquids between meals instead of during meals.  To  prevent constipation: ? Eat foods that are high in fiber, like fresh fruits and vegetables, whole grains, and beans. ? Drink enough fluids to keep your pee (urine) clear or pale yellow. Activity  Exercise only as told by your doctor. Stop exercising if you have cramps or pain in your lower belly (abdomen) or low back.  Do not exercise if it is too hot, too humid, or if you are in a place of great height (high altitude).  Try to avoid standing for long periods of time. Move your legs often if you must stand in one place for a long time.  Avoid heavy lifting.  Wear low-heeled shoes. Sit and stand up straight.  You can have sex unless your doctor tells you not to. Relieving pain and discomfort  Wear a good support bra if your breasts are sore.  Take warm water baths (sitz baths) to  soothe pain or discomfort caused by hemorrhoids. Use hemorrhoid cream if your doctor says it is okay.  Rest with your legs raised if you have leg cramps or low back pain.  If you have puffy, bulging veins (varicose veins) in your legs: ? Wear support hose or compression stockings as told by your doctor. ? Raise (elevate) your feet for 15 minutes, 3-4 times a day. ? Limit salt in your food. Prenatal care  Schedule your prenatal visits by the twelfth week of pregnancy.  Write down your questions. Take them to your prenatal visits.  Keep all your prenatal visits as told by your doctor. This is important. Safety  Wear your seat belt at all times when driving.  Make a list of emergency phone numbers. The list should include numbers for family, friends, the hospital, and police and fire departments. General instructions  Ask your doctor for a referral to a local prenatal class. Begin classes no later than at the start of month 6 of your pregnancy.  Ask for help if you need counseling or if you need help with nutrition. Your doctor can give you advice or tell you where to go for help.  Do not use hot  tubs, steam rooms, or saunas.  Do not douche or use tampons or scented sanitary pads.  Do not cross your legs for long periods of time.  Avoid all herbs and alcohol. Avoid drugs that are not approved by your doctor.  Do not use any tobacco products, including cigarettes, chewing tobacco, and electronic cigarettes. If you need help quitting, ask your doctor. You may get counseling or other support to help you quit.  Avoid cat litter boxes and soil used by cats. These carry germs that can cause birth defects in the baby and can cause a loss of your baby (miscarriage) or stillbirth.  Visit your dentist. At home, brush your teeth with a soft toothbrush. Be gentle when you floss. Contact a doctor if:  You are dizzy.  You have mild cramps or pressure in your lower belly.  You have a nagging pain in your belly area.  You continue to feel sick to your stomach, you throw up, or you have watery poop (diarrhea).  You have a bad smelling fluid coming from your vagina.  You have pain when you pee (urinate).  You have increased puffiness (swelling) in your face, hands, legs, or ankles. Get help right away if:  You have a fever.  You are leaking fluid from your vagina.  You have spotting or bleeding from your vagina.  You have very bad belly cramping or pain.  You gain or lose weight rapidly.  You throw up blood. It may look like coffee grounds.  You are around people who have [place] measles, fifth disease, or chickenpox.  You have a very bad headache.  You have shortness of breath.  You have any kind of trauma, such as from a fall or a car accident. Summary  The first trimester of pregnancy is from week 1 until the end of week 13 (months 1 through 3).  To take care of yourself and your unborn baby, you will need to eat healthy meals, take medicines only if your doctor tells you to do so, and do activities that are safe for you and your baby.  Keep all follow-up visits as told  by your doctor. This is important as your doctor will have to ensure that your baby is healthy and growing well. This information  is not intended to replace advice given to you by your health care provider. Make sure you discuss any questions you have with your health care provider. Document Revised: 07/24/2018 Document Reviewed: 04/10/2016 Elsevier Patient Education  2020 [employer].

## 2019-10-15 ENCOUNTER — Ambulatory Visit: Payer: Managed Care, Other (non HMO) | Admitting: Obstetrics and Gynecology

## 2019-10-15 LAB — CBC
Hematocrit: 36.2 % (ref 34.0–46.6)
Hemoglobin: 11.8 g/dL (ref 11.1–15.9)
MCH: 26.5 pg — ABNORMAL LOW (ref 26.6–33.0)
MCHC: 32.6 g/dL (ref 31.5–35.7)
MCV: 81 fL (ref 79–97)
Platelets: 396 10*3/uL (ref 150–450)
RBC: 4.45 x10E6/uL (ref 3.77–5.28)
RDW: 12.9 % (ref 11.7–15.4)
WBC: 13.7 10*3/uL — ABNORMAL HIGH (ref 3.4–10.8)

## 2019-10-15 NOTE — Progress Notes (Signed)
WBC increased slightly from previous.  Please contact pt to make her aware.  If there is any fever/chills or worsening abdominal pain, advise return to ED for further evaluation.  Otherwise keep her future appointments.

## 2019-10-20 ENCOUNTER — Ambulatory Visit (INDEPENDENT_AMBULATORY_CARE_PROVIDER_SITE_OTHER): Payer: Managed Care, Other (non HMO)

## 2019-10-20 ENCOUNTER — Other Ambulatory Visit: Payer: Self-pay

## 2019-10-20 DIAGNOSIS — O0990 Supervision of high risk pregnancy, unspecified, unspecified trimester: Secondary | ICD-10-CM | POA: Insufficient documentation

## 2019-10-20 DIAGNOSIS — Z3A00 Weeks of gestation of pregnancy not specified: Secondary | ICD-10-CM

## 2019-10-20 DIAGNOSIS — Z3400 Encounter for supervision of normal first pregnancy, unspecified trimester: Secondary | ICD-10-CM

## 2019-10-20 MED ORDER — BLOOD PRESSURE KIT DEVI
1.0000 | 0 refills | Status: DC
Start: 2019-10-20 — End: 2020-01-19

## 2019-10-20 NOTE — Progress Notes (Signed)
PRENATAL INTAKE SUMMARY  Roberta Lutz presents today New OB Nurse Interview.  OB History    Gravida  1   Para      Term      Preterm      AB      Living        SAB      TAB      Ectopic      Multiple      Live Births             I have reviewed the patient's medical, obstetrical, social, and family histories, medications, and available lab results.  SUBJECTIVE She complains of Fatigue.  OBJECTIVE Initial Nurse Intake (New OB)  GENERAL APPEARANCE: oriented to person, place and time   ASSESSMENT Normal pregnancy PHQ-9=8 LMP 07/22/2019  PLAN Prenatal care Prenatal labs will be done at [facility] visit BP cuff ordered, patient will pick up and take to [facility] Appt. BabyScripts downloaded

## 2019-10-26 ENCOUNTER — Other Ambulatory Visit: Payer: Self-pay

## 2019-10-26 ENCOUNTER — Encounter: Payer: Self-pay | Admitting: Obstetrics and Gynecology

## 2019-10-26 ENCOUNTER — Ambulatory Visit (INDEPENDENT_AMBULATORY_CARE_PROVIDER_SITE_OTHER): Payer: Managed Care, Other (non HMO) | Admitting: Obstetrics and Gynecology

## 2019-10-26 ENCOUNTER — Other Ambulatory Visit (HOSPITAL_COMMUNITY)
Admission: RE | Admit: 2019-10-26 | Discharge: 2019-10-26 | Disposition: A | Discharge status: Home / Self Care | Payer: Managed Care, Other (non HMO) | Source: Ambulatory Visit | Attending: Obstetrics and Gynecology | Admitting: Obstetrics and Gynecology

## 2019-10-26 VITALS — BP 106/71 | HR 89 | Wt 174.8 lb

## 2019-10-26 DIAGNOSIS — Z3A13 13 weeks gestation of pregnancy: Secondary | ICD-10-CM

## 2019-10-26 DIAGNOSIS — Z3401 Encounter for supervision of normal first pregnancy, first trimester: Secondary | ICD-10-CM | POA: Diagnosis present

## 2019-10-26 NOTE — Progress Notes (Signed)
Subjective:  Roberta Lutz is a G1P0 [redacted]w[redacted]d being seen today for her first obstetrical visit.  Her obstetrical history is significant for n/a. Patient does intend to breast feed. Pregnancy history fully reviewed.  Patient reports no complaints.  The previous abdominal pain has no resolved.  Patient states that her mood is slightly down.  She reports some history of anxiety, but never officially diagnosed or started on medication.  She has trouble sleeping at night.    BP 106/71   Pulse 89   Wt 79.3 kg   LMP 07/22/2019   BMI 30.96 kg/m   HISTORY: OB History  Gravida Para Term Preterm AB Living  1            SAB TAB Ectopic Multiple Live Births               # Outcome Date GA Lbr Len/2nd Weight Sex Delivery Anes PTL Lv  1 Current             Past Medical History:  Diagnosis Date  . Anxiety   . Diabetes mellitus without complication (HCC)    Type II  . Headache   . PCOS (polycystic ovarian syndrome)   . Pilonidal cyst   . Rosacea     Past Surgical History:  Procedure Laterality Date  . DRUG INDUCED ENDOSCOPY     Gastritis    Family History  Problem Relation Age of Onset  . Diabetes Mother   . Asthma Mother   . Diabetes Maternal Grandmother   . Arthritis Maternal Grandmother   . ADD / ADHD Brother   . Asthma Brother   . Cancer Maternal Grandfather      Exam  BP 106/71   Pulse 89   Wt 79.3 kg   LMP 07/22/2019   BMI 30.96 kg/m   Chaperone present during exam  CONSTITUTIONAL: Well-developed, well-nourished female in no acute distress.  HENT:  Normocephalic, atraumatic, External right and left ear normal. Oropharynx is clear and moist EYES: Conjunctivae and EOM are normal. Pupils are equal, round, and reactive to light. No scleral icterus.  NECK: Normal range of motion, supple, no masses.  Normal thyroid.  CARDIOVASCULAR: Normal heart rate noted, regular rhythm RESPIRATORY: Clear to auscultation bilaterally. Effort and breath sounds normal, no problems  with respiration noted. BREASTS: Symmetric in size. No masses, skin changes, nipple drainage, or lymphadenopathy. ABDOMEN: Soft, normal bowel sounds, no distention noted.  No tenderness, rebound or guarding.  PELVIC: Normal appearing external genitalia; normal appearing vaginal mucosa and cervix. White, milky discharge noted. Normal uterine size, no other palpable masses, no uterine or adnexal tenderness. MUSCULOSKELETAL: Normal range of motion. No tenderness.  No cyanosis, clubbing, or edema.  2+ distal pulses. SKIN: Skin is warm and dry. No rash noted. Not diaphoretic. No erythema. No pallor. NEUROLOGIC: Alert and oriented to person, place, and time. Normal reflexes, muscle tone coordination. No cranial nerve deficit noted. PSYCHIATRIC: Normal mood and affect. Normal behavior. Normal judgment and thought content.    Assessment:    Pregnancy: G1P0 Patient Active Problem List   Diagnosis Date Noted  . Supervision of normal first pregnancy 10/20/2019  . Abdominal pain affecting pregnancy 09/22/2019  . Subchorionic hematoma in first trimester 09/22/2019  . Threatened miscarriage in early pregnancy 09/22/2019  . Pilonidal cyst 07/03/2011      Plan:   1. Encounter for supervision of normal first pregnancy in first trimester Will order prenatal labs, including genetic screening. Discussed with patient the signs and symptoms  of depression, including postpartum depression. Recommend Unisom for sleep. Will fu on vaginitis panel. Pap collected today.   Anatomy ultrasound to be done at 18 weeks.       Problem list reviewed and updated. 50% of 40 min visit spent on counseling and coordination of care.     Darrin Nipper. [facility], MD 10/26/2019

## 2019-10-26 NOTE — Progress Notes (Signed)
Last Pap: 02/03/2015

## 2019-10-27 LAB — CBC/D/PLT+RPR+RH+ABO+RUB AB...
Antibody Screen: NEGATIVE
Basophils Absolute: 0 10*3/uL (ref 0.0–0.2)
Basos: 0 %
EOS (ABSOLUTE): 0.1 10*3/uL (ref 0.0–0.4)
Eos: 1 %
HCV Ab: <0.1 s/co ratio (ref 0.0–0.9)
HIV Screen 4th Generation wRfx: NONREACTIVE
Hematocrit: 35.1 % (ref 34.0–46.6)
Hemoglobin: 11.7 g/dL (ref 11.1–15.9)
Hepatitis B Surface Ag: NEGATIVE
Immature Grans (Abs): 0 10*3/uL (ref 0.0–0.1)
Immature Granulocytes: 0 %
Lymphocytes Absolute: 3.2 10*3/uL — ABNORMAL HIGH (ref 0.7–3.1)
Lymphs: 27 %
MCH: 27.1 pg (ref 26.6–33.0)
MCHC: 33.3 g/dL (ref 31.5–35.7)
MCV: 81 fL (ref 79–97)
Monocytes Absolute: 0.9 10*3/uL (ref 0.1–0.9)
Monocytes: 8 %
Neutrophils Absolute: 7.4 10*3/uL — ABNORMAL HIGH (ref 1.4–7.0)
Neutrophils: 64 %
Platelets: 352 10*3/uL (ref 150–450)
RBC: 4.32 x10E6/uL (ref 3.77–5.28)
RDW: 13 % (ref 11.7–15.4)
RPR Ser Ql: NONREACTIVE
Rh Factor: POSITIVE
Rubella Antibodies, IGG: 1.93 index (ref >0.99)
WBC: 11.7 10*3/uL — ABNORMAL HIGH (ref 3.4–10.8)

## 2019-10-27 LAB — CERVICOVAGINAL ANCILLARY ONLY
Bacterial Vaginitis (gardnerella): NEGATIVE
Candida Glabrata: NEGATIVE
Candida Vaginitis: NEGATIVE
Chlamydia: NEGATIVE
Comment: NEGATIVE
Comment: NEGATIVE
Comment: NEGATIVE
Comment: NEGATIVE
Comment: NEGATIVE
Comment: NORMAL
Neisseria Gonorrhea: NEGATIVE
Trichomonas: NEGATIVE

## 2019-10-27 LAB — CYTOLOGY - PAP: Diagnosis: NEGATIVE

## 2019-10-27 LAB — HCV INTERPRETATION

## 2019-10-28 LAB — CULTURE, OB URINE

## 2019-10-28 LAB — URINE CULTURE, OB REFLEX

## 2019-11-02 ENCOUNTER — Encounter: Payer: Self-pay | Admitting: Obstetrics and Gynecology

## 2019-11-03 ENCOUNTER — Telehealth: Payer: Self-pay

## 2019-11-03 NOTE — Telephone Encounter (Signed)
Pt called and LVM reporting "severe headache", pt reports she took tylenol a couple of hours ago and it is not helping. Pt did not mention any other symptoms. I called pt back to evaluate, no answer. LVM advising patient to call back or go to the hospital for evaluation.

## 2019-11-09 ENCOUNTER — Encounter: Payer: Self-pay | Admitting: Obstetrics and Gynecology

## 2019-11-09 DIAGNOSIS — D563 Thalassemia minor: Secondary | ICD-10-CM | POA: Insufficient documentation

## 2019-11-15 DIAGNOSIS — Z419 Encounter for procedure for purposes other than remedying health state, unspecified: Secondary | ICD-10-CM | POA: Diagnosis not present

## 2019-11-16 ENCOUNTER — Telehealth: Payer: Self-pay

## 2019-11-16 NOTE — Telephone Encounter (Signed)
Pt called and left message regarding fever and hurts to talk. Pt had asked if she can take Tylenol  Called pt back to advise no answer LVM.

## 2019-11-23 ENCOUNTER — Encounter: Payer: Managed Care, Other (non HMO) | Admitting: Obstetrics & Gynecology

## 2019-11-24 ENCOUNTER — Ambulatory Visit (INDEPENDENT_AMBULATORY_CARE_PROVIDER_SITE_OTHER): Payer: Managed Care, Other (non HMO) | Admitting: Obstetrics and Gynecology

## 2019-11-24 ENCOUNTER — Other Ambulatory Visit: Payer: Self-pay

## 2019-11-24 ENCOUNTER — Encounter: Payer: Self-pay | Admitting: Obstetrics and Gynecology

## 2019-11-24 VITALS — BP 120/80 | HR 101 | Wt 185.5 lb

## 2019-11-24 DIAGNOSIS — O0992 Supervision of high risk pregnancy, unspecified, second trimester: Secondary | ICD-10-CM

## 2019-11-24 DIAGNOSIS — O24113 Pre-existing diabetes mellitus, type 2, in pregnancy, third trimester: Secondary | ICD-10-CM

## 2019-11-24 DIAGNOSIS — O24912 Unspecified diabetes mellitus in pregnancy, second trimester: Secondary | ICD-10-CM

## 2019-11-24 HISTORY — DX: Pre-existing type 2 diabetes mellitus, in pregnancy, third trimester: O24.113

## 2019-11-24 NOTE — Patient Instructions (Signed)
Diabetes Mellitus and Exercise Exercising regularly is important for your overall health, especially when you have diabetes (diabetes mellitus). Exercising is not only about losing weight. It has many other health benefits, such as increasing muscle strength and bone density and reducing body fat and stress. This leads to improved fitness, flexibility, and endurance, all of which result in better overall health. Exercise has additional benefits for people with diabetes, including:  Reducing appetite.  Helping to lower and control blood glucose.  Lowering blood pressure.  Helping to control amounts of fatty substances (lipids) in the blood, such as cholesterol and triglycerides.  Helping the body to respond better to insulin (improving insulin sensitivity).  Reducing how much insulin the body needs.  Decreasing the risk for heart disease by: ? Lowering cholesterol and triglyceride levels. ? Increasing the levels of good cholesterol. ? Lowering blood glucose levels. What is my activity plan? Your health care provider or certified diabetes educator can help you make a plan for the type and frequency of exercise (activity plan) that works for you. Make sure that you:  Do at least 150 minutes of moderate-intensity or vigorous-intensity exercise each week. This could be brisk walking, biking, or water aerobics. ? Do stretching and strength exercises, such as yoga or weightlifting, at least 2 times a week. ? Spread out your activity over at least 3 days of the week.  Get some form of physical activity every day. ? Do not go more than 2 days in a row without some kind of physical activity. ? Avoid being inactive for more than 30 minutes at a time. Take frequent breaks to walk or stretch.  Choose a type of exercise or activity that you enjoy, and set realistic goals.  Start slowly, and gradually increase the intensity of your exercise over time. What do I need to know about managing my  diabetes?   Check your blood glucose before and after exercising. ? If your blood glucose is 240 mg/dL (13.3 mmol/L) or higher before you exercise, check your urine for ketones. If you have ketones in your urine, do not exercise until your blood glucose returns to normal. ? If your blood glucose is 100 mg/dL (5.6 mmol/L) or lower, eat a snack containing 15-20 grams of carbohydrate. Check your blood glucose 15 minutes after the snack to make sure that your level is above 100 mg/dL (5.6 mmol/L) before you start your exercise.  Know the symptoms of low blood glucose (hypoglycemia) and how to treat it. Your risk for hypoglycemia increases during and after exercise. Common symptoms of hypoglycemia can include: ? Hunger. ? Anxiety. ? Sweating and feeling clammy. ? Confusion. ? Dizziness or feeling light-headed. ? Increased heart rate or palpitations. ? Blurry vision. ? Tingling or numbness around the mouth, lips, or tongue. ? Tremors or shakes. ? Irritability.  Keep a rapid-acting carbohydrate snack available before, during, and after exercise to help prevent or treat hypoglycemia.  Avoid injecting insulin into areas of the body that are going to be exercised. For example, avoid injecting insulin into: ? The arms, when playing tennis. ? The legs, when jogging.  Keep records of your exercise habits. Doing this can help you and your health care provider adjust your diabetes management plan as needed. Write down: ? Food that you eat before and after you exercise. ? Blood glucose levels before and after you exercise. ? The type and amount of exercise you have done. ? When your insulin is expected to peak, if you use   insulin. Avoid exercising at times when your insulin is peaking.  When you start a new exercise or activity, work with your health care provider to make sure the activity is safe for you, and to adjust your insulin, medicines, or food intake as needed.  Drink plenty of water while  you exercise to prevent dehydration or heat stroke. Drink enough fluid to keep your urine clear or pale yellow. Summary  Exercising regularly is important for your overall health, especially when you have diabetes (diabetes mellitus).  Exercising has many health benefits, such as increasing muscle strength and bone density and reducing body fat and stress.  Your health care provider or certified diabetes educator can help you make a plan for the type and frequency of exercise (activity plan) that works for you.  When you start a new exercise or activity, work with your health care provider to make sure the activity is safe for you, and to adjust your insulin, medicines, or food intake as needed. This information is not intended to replace advice given to you by your health care provider. Make sure you discuss any questions you have with your health care provider. Document Revised: 10/25/2016 Document Reviewed: 09/12/2015 Elsevier Patient Education  2020 [employer]  

## 2019-11-24 NOTE — Progress Notes (Signed)
Subjective:  Roberta Lutz is a 24 y.o. G1P0 at [redacted]w[redacted]d being seen today for ongoing prenatal care.  She is currently monitored for the following issues for this high-risk pregnancy and has Pilonidal cyst; Abdominal pain affecting pregnancy; Subchorionic hematoma in first trimester; Threatened miscarriage in early pregnancy; Supervision of normal first pregnancy; Alpha thalassemia silent carrier; and Diabetes mellitus affecting pregnancy in second trimester on their problem list.  GDM: Patient taking nothing.  Diet controlled.  Reports no hypoglycemic episodes.   Fasting: not checking 2hr PP: not checking  Patient reports no complaints.  Contractions: Not present. Vag. Bleeding: None.  Movement: Present. Denies leaking of fluid.   The following portions of the patient's history were reviewed and updated as appropriate: allergies, current medications, past family history, past medical history, past social history, past surgical history and problem list. Problem list updated.  Objective:   Vitals:   11/24/19 0818  BP: 120/80  Pulse: (!) 101  Weight: 185 lb 8 oz (84.1 kg)    Fetal Status: Fetal Heart Rate (bpm): 152   Movement: Present     General:  Alert, oriented and cooperative. Patient is in no acute distress.  Skin: Skin is warm and dry. No rash noted.   Cardiovascular: Normal heart rate noted  Respiratory: Normal respiratory effort, no problems with respiration noted  Abdomen: Soft, gravid, appropriate for gestational age. Pain/Pressure: Present     Pelvic: Vag. Bleeding: None     Cervical exam deferred        Extremities: Normal range of motion.  Edema: None  Mental Status: Normal mood and affect. Normal behavior. Normal judgment and thought content.   Urinalysis:      Assessment and Plan:  Pregnancy: G1P0 at [redacted]w[redacted]d  1. Supervision of high risk pregnancy in second trimester  - AFP, Serum, Open Spina Bifida - Patient scheduled for OB ultrasound on 11/26/2019.    2. Diabetes  mellitus affecting pregnancy in second trimester - Patient to start checking finger sticks fasting and 2 hour postprandials. - Referral to Nutrition and Diabetes Services  Preterm labor symptoms and general obstetric precautions including but not limited to vaginal bleeding, contractions, leaking of fluid and fetal movement were reviewed in detail with the patient. Please refer to After Visit Summary for other counseling recommendations.  Return in about 4 weeks (around 12/22/2019) for HROB.   Johnny Bridge, MD

## 2019-11-24 NOTE — Progress Notes (Signed)
Pt. Wants to discuss genetic testing results Pt. Request medication for sleep

## 2019-11-26 ENCOUNTER — Other Ambulatory Visit: Payer: Self-pay

## 2019-11-26 ENCOUNTER — Other Ambulatory Visit: Payer: Self-pay | Admitting: Obstetrics and Gynecology

## 2019-11-26 ENCOUNTER — Other Ambulatory Visit: Payer: Self-pay | Admitting: *Deleted

## 2019-11-26 ENCOUNTER — Ambulatory Visit: Payer: Managed Care, Other (non HMO) | Attending: Obstetrics and Gynecology

## 2019-11-26 ENCOUNTER — Ambulatory Visit: Payer: Managed Care, Other (non HMO) | Admitting: *Deleted

## 2019-11-26 DIAGNOSIS — Z3401 Encounter for supervision of normal first pregnancy, first trimester: Secondary | ICD-10-CM | POA: Diagnosis present

## 2019-11-26 DIAGNOSIS — Z363 Encounter for antenatal screening for malformations: Secondary | ICD-10-CM | POA: Diagnosis not present

## 2019-11-26 DIAGNOSIS — E669 Obesity, unspecified: Secondary | ICD-10-CM

## 2019-11-26 DIAGNOSIS — O24112 Pre-existing diabetes mellitus, type 2, in pregnancy, second trimester: Secondary | ICD-10-CM

## 2019-11-26 DIAGNOSIS — Z362 Encounter for other antenatal screening follow-up: Secondary | ICD-10-CM

## 2019-11-26 DIAGNOSIS — O358XX0 Maternal care for other (suspected) fetal abnormality and damage, not applicable or unspecified: Secondary | ICD-10-CM

## 2019-11-26 DIAGNOSIS — D563 Thalassemia minor: Secondary | ICD-10-CM | POA: Insufficient documentation

## 2019-11-26 DIAGNOSIS — Z3A18 18 weeks gestation of pregnancy: Secondary | ICD-10-CM

## 2019-11-26 DIAGNOSIS — O99212 Obesity complicating pregnancy, second trimester: Secondary | ICD-10-CM

## 2019-11-26 LAB — AFP, SERUM, OPEN SPINA BIFIDA
AFP MoM: 0.62
AFP Value: 25.5 ng/mL
Gest. Age on Collection Date: 17.6 weeks
Maternal Age At EDD: 26.3 yr
OSBR Risk 1 IN: 10000
Test Results:: NEGATIVE
Weight: 185 [lb_av]

## 2019-12-16 DIAGNOSIS — Z419 Encounter for procedure for purposes other than remedying health state, unspecified: Secondary | ICD-10-CM | POA: Diagnosis not present

## 2019-12-22 ENCOUNTER — Encounter: Payer: Self-pay | Admitting: Obstetrics

## 2019-12-22 ENCOUNTER — Encounter: Payer: Self-pay | Admitting: Obstetrics and Gynecology

## 2019-12-22 ENCOUNTER — Ambulatory Visit (INDEPENDENT_AMBULATORY_CARE_PROVIDER_SITE_OTHER): Payer: Managed Care, Other (non HMO) | Admitting: Obstetrics and Gynecology

## 2019-12-22 ENCOUNTER — Other Ambulatory Visit: Payer: Self-pay

## 2019-12-22 VITALS — BP 105/71 | HR 98 | Wt 198.9 lb

## 2019-12-22 DIAGNOSIS — O0992 Supervision of high risk pregnancy, unspecified, second trimester: Secondary | ICD-10-CM

## 2019-12-22 DIAGNOSIS — O24912 Unspecified diabetes mellitus in pregnancy, second trimester: Secondary | ICD-10-CM

## 2019-12-22 MED ORDER — ACCU-CHEK GUIDE VI STRP: ORAL_STRIP | 12 refills | Status: DC

## 2019-12-22 MED ORDER — ACCU-CHEK SOFTCLIX LANCETS MISC: 12 refills | Status: DC

## 2019-12-22 MED ORDER — ACCU-CHEK GUIDE W/DEVICE KIT: 1.0000 | PACK | Freq: Four times a day (QID) | 0 refills | Status: DC

## 2019-12-22 NOTE — Patient Instructions (Signed)
Diabetes Mellitus and Exercise Exercising regularly is important for your overall health, especially when you have diabetes (diabetes mellitus). Exercising is not only about losing weight. It has many other health benefits, such as increasing muscle strength and bone density and reducing body fat and stress. This leads to improved fitness, flexibility, and endurance, all of which result in better overall health. Exercise has additional benefits for people with diabetes, including:  Reducing appetite.  Helping to lower and control blood glucose.  Lowering blood pressure.  Helping to control amounts of fatty substances (lipids) in the blood, such as cholesterol and triglycerides.  Helping the body to respond better to insulin (improving insulin sensitivity).  Reducing how much insulin the body needs.  Decreasing the risk for heart disease by: ? Lowering cholesterol and triglyceride levels. ? Increasing the levels of good cholesterol. ? Lowering blood glucose levels. What is my activity plan? Your health care provider or certified diabetes educator can help you make a plan for the type and frequency of exercise (activity plan) that works for you. Make sure that you:  Do at least 150 minutes of moderate-intensity or vigorous-intensity exercise each week. This could be brisk walking, biking, or water aerobics. ? Do stretching and strength exercises, such as yoga or weightlifting, at least 2 times a week. ? Spread out your activity over at least 3 days of the week.  Get some form of physical activity every day. ? Do not go more than 2 days in a row without some kind of physical activity. ? Avoid being inactive for more than 30 minutes at a time. Take frequent breaks to walk or stretch.  Choose a type of exercise or activity that you enjoy, and set realistic goals.  Start slowly, and gradually increase the intensity of your exercise over time. What do I need to know about managing my  diabetes?   Check your blood glucose before and after exercising. ? If your blood glucose is 240 mg/dL (13.3 mmol/L) or higher before you exercise, check your urine for ketones. If you have ketones in your urine, do not exercise until your blood glucose returns to normal. ? If your blood glucose is 100 mg/dL (5.6 mmol/L) or lower, eat a snack containing 15-20 grams of carbohydrate. Check your blood glucose 15 minutes after the snack to make sure that your level is above 100 mg/dL (5.6 mmol/L) before you start your exercise.  Know the symptoms of low blood glucose (hypoglycemia) and how to treat it. Your risk for hypoglycemia increases during and after exercise. Common symptoms of hypoglycemia can include: ? Hunger. ? Anxiety. ? Sweating and feeling clammy. ? Confusion. ? Dizziness or feeling light-headed. ? Increased heart rate or palpitations. ? Blurry vision. ? Tingling or numbness around the mouth, lips, or tongue. ? Tremors or shakes. ? Irritability.  Keep a rapid-acting carbohydrate snack available before, during, and after exercise to help prevent or treat hypoglycemia.  Avoid injecting insulin into areas of the body that are going to be exercised. For example, avoid injecting insulin into: ? The arms, when playing tennis. ? The legs, when jogging.  Keep records of your exercise habits. Doing this can help you and your health care provider adjust your diabetes management plan as needed. Write down: ? Food that you eat before and after you exercise. ? Blood glucose levels before and after you exercise. ? The type and amount of exercise you have done. ? When your insulin is expected to peak, if you use   insulin. Avoid exercising at times when your insulin is peaking.  When you start a new exercise or activity, work with your health care provider to make sure the activity is safe for you, and to adjust your insulin, medicines, or food intake as needed.  Drink plenty of water while  you exercise to prevent dehydration or heat stroke. Drink enough fluid to keep your urine clear or pale yellow. Summary  Exercising regularly is important for your overall health, especially when you have diabetes (diabetes mellitus).  Exercising has many health benefits, such as increasing muscle strength and bone density and reducing body fat and stress.  Your health care provider or certified diabetes educator can help you make a plan for the type and frequency of exercise (activity plan) that works for you.  When you start a new exercise or activity, work with your health care provider to make sure the activity is safe for you, and to adjust your insulin, medicines, or food intake as needed. This information is not intended to replace advice given to you by your health care provider. Make sure you discuss any questions you have with your health care provider. Document Revised: 10/25/2016 Document Reviewed: 09/12/2015 Elsevier Patient Education  2020 [employer]  

## 2019-12-22 NOTE — Progress Notes (Signed)
Pt is here for ROB, [redacted]w[redacted]d. Type 2 DM, pt has not started checking BG levels yet, appt for diabetic education on 09/23. Supplies sent today, pt made aware.

## 2019-12-22 NOTE — Progress Notes (Signed)
Subjective:  Roberta Lutz is a 24 y.o. G1P0 at 6w6dbeing seen today for ongoing prenatal care.  She is currently monitored for the following issues for this high-risk pregnancy and has Pilonidal cyst; Abdominal pain affecting pregnancy; Subchorionic hematoma in first trimester; Threatened miscarriage in early pregnancy; Supervision of normal first pregnancy; Alpha thalassemia silent carrier; and Diabetes mellitus affecting pregnancy in second trimester on their problem list.  Type 2 DM.  Currently, no medications.  Reports no hypoglycemic episodes.  Fasting: unknown 2hr PP: unknown  The patient is has not started checking her blood sugars.  She states that she has not obtained the glucometer, etc.    Patient reports no complaints.  Contractions: Irritability. Vag. Bleeding: None.  Movement: Present. Denies leaking of fluid.   The following portions of the patient's history were reviewed and updated as appropriate: allergies, current medications, past family history, past medical history, past social history, past surgical history and problem list. Problem list updated.  Objective:   Vitals:   12/22/19 0842  BP: 105/71  Pulse: 98  Weight: 198 lb 14.4 oz (90.2 kg)    Fetal Status: Fetal Heart Rate (bpm): 150 Fundal Height: 22 cm Movement: Present     General:  Alert, oriented and cooperative. Patient is in no acute distress.  Skin: Skin is warm and dry. No rash noted.   Cardiovascular: Normal heart rate noted  Respiratory: Normal respiratory effort, no problems with respiration noted  Abdomen: Soft, gravid, appropriate for gestational age. Pain/Pressure: Present     Pelvic: Vag. Bleeding: None     Cervical exam deferred        Extremities: Normal range of motion.  Edema: None  Mental Status: Normal mood and affect. Normal behavior. Normal judgment and thought content.   Urinalysis:      Assessment and Plan:  Pregnancy: G1P0 at 261w6d1. Supervision of high risk pregnancy in  second trimester  - Blood Glucose Monitoring Suppl (ACCU-CHEK GUIDE) w/Device KIT; 1 Device by Does not apply route 4 (four) times daily.  Dispense: 1 kit; Refill: 0 - Accu-Chek Softclix Lancets lancets; Use as instructed  Dispense: 100 each; Refill: 12 - glucose blood (ACCU-CHEK GUIDE) test strip; Use to check blood sugars four times a day was instructed  Dispense: 50 each; Refill: 12 - Patient scheduled for ultrasound on 12/24/2019.  2. Diabetes mellitus affecting pregnancy in second trimester - Patient rescheduled for diabetic counseling. - Blood glucose monitoring reinforced. - Will check HbA1c today. - Patient rescheduled for fetal echo.  Preterm labor symptoms and general obstetric precautions including but not limited to vaginal bleeding, contractions, leaking of fluid and fetal movement were reviewed in detail with the patient. Please refer to After Visit Summary for other counseling recommendations.  Return in about 4 weeks (around 01/19/2020) for HROB - virtual visit.   WeCephas DarbyMD

## 2019-12-23 LAB — HEMOGLOBIN A1C
Est. average glucose Bld gHb Est-mCnc: 100 mg/dL
Hgb A1c MFr Bld: 5.1 % (ref 4.8–5.6)

## 2019-12-24 ENCOUNTER — Other Ambulatory Visit: Payer: Self-pay

## 2019-12-24 ENCOUNTER — Other Ambulatory Visit: Payer: Self-pay | Admitting: *Deleted

## 2019-12-24 ENCOUNTER — Ambulatory Visit: Payer: Managed Care, Other (non HMO) | Admitting: *Deleted

## 2019-12-24 ENCOUNTER — Ambulatory Visit: Payer: Managed Care, Other (non HMO) | Attending: Obstetrics and Gynecology

## 2019-12-24 DIAGNOSIS — O24119 Pre-existing diabetes mellitus, type 2, in pregnancy, unspecified trimester: Secondary | ICD-10-CM

## 2019-12-24 DIAGNOSIS — O24112 Pre-existing diabetes mellitus, type 2, in pregnancy, second trimester: Secondary | ICD-10-CM | POA: Diagnosis not present

## 2019-12-24 DIAGNOSIS — D563 Thalassemia minor: Secondary | ICD-10-CM | POA: Insufficient documentation

## 2019-12-24 DIAGNOSIS — Z3A22 22 weeks gestation of pregnancy: Secondary | ICD-10-CM | POA: Diagnosis not present

## 2019-12-24 DIAGNOSIS — E119 Type 2 diabetes mellitus without complications: Secondary | ICD-10-CM

## 2019-12-24 DIAGNOSIS — Z362 Encounter for other antenatal screening follow-up: Secondary | ICD-10-CM | POA: Diagnosis present

## 2019-12-24 DIAGNOSIS — Z3401 Encounter for supervision of normal first pregnancy, first trimester: Secondary | ICD-10-CM | POA: Diagnosis present

## 2020-01-05 ENCOUNTER — Telehealth (INDEPENDENT_AMBULATORY_CARE_PROVIDER_SITE_OTHER): Payer: Managed Care, Other (non HMO) | Admitting: Obstetrics & Gynecology

## 2020-01-05 DIAGNOSIS — O24912 Unspecified diabetes mellitus in pregnancy, second trimester: Secondary | ICD-10-CM

## 2020-01-05 DIAGNOSIS — O219 Vomiting of pregnancy, unspecified: Secondary | ICD-10-CM

## 2020-01-05 DIAGNOSIS — G4709 Other insomnia: Secondary | ICD-10-CM

## 2020-01-05 DIAGNOSIS — Z3A23 23 weeks gestation of pregnancy: Secondary | ICD-10-CM

## 2020-01-05 DIAGNOSIS — O0992 Supervision of high risk pregnancy, unspecified, second trimester: Secondary | ICD-10-CM

## 2020-01-05 MED ORDER — PROMETHAZINE HCL 25 MG PO TABS: 25.0000 mg | ORAL_TABLET | Freq: Four times a day (QID) | ORAL | 2 refills | Status: DC | PRN

## 2020-01-05 MED ORDER — ZOLPIDEM TARTRATE 5 MG PO TABS: 5.0000 mg | ORAL_TABLET | Freq: Every evening | ORAL | 1 refills | Status: DC | PRN

## 2020-01-05 NOTE — Patient Instructions (Signed)
Return to office for any scheduled appointments. Call the office or go to the MAU at [facility] [place] if:  You begin to have strong, frequent contractions  Your water breaks.  Sometimes it is a big gush of fluid, sometimes it is just a trickle that keeps getting your panties wet or running down your legs  You have vaginal bleeding.  It is normal to have a small amount of spotting if your cervix was checked.   You do not feel your baby moving like normal.  If you do not, get something to eat and drink and lay down and focus on feeling your baby move.   If your baby is still not moving like normal, you should call the office or go to MAU.  Any other obstetric concerns.   

## 2020-01-05 NOTE — Progress Notes (Signed)
I connected with  Roberta Lutz on 01/05/20 by a video enabled telemedicine application and verified that I am speaking with the correct person using two identifiers. Patient is at work and I am at [employer].   I discussed the limitations of evaluation and management by telemedicine. The patient expressed understanding and agreed to proceed.  MyChart ROB c/o not been able to sleep at all x 2 weeks, she is taking Unisom, Benadryl and Sleepy Time Tea, non of which works. No appetite, NV x2 Weeks. The last 2 nights she has a tingling all her body.  BS AM fasting 74-100 and 2 hrs. After meals 100-115.

## 2020-01-05 NOTE — Progress Notes (Signed)
OBSTETRICS PRENATAL VIRTUAL VISIT ENCOUNTER NOTE  [facility]   I connected with Roberta SharpsAaliyah Lutz on 01/05/20 at 10:45 AM EDT by MyChart Video Encounter at home and verified that I am speaking with the correct person using two identifiers.   I discussed the limitations, risks, security and privacy concerns of performing an evaluation and management service virtually and the availability of in person appointments. I also discussed with the patient that there may be a patient responsible charge related to this service. The patient expressed understanding and agreed to proceed. Subjective:  Roberta Lutz is a 24 y.o. G1P0 at 3945w6d being seen today for ongoing prenatal care.  She is currently monitored for the following issues for this high-risk pregnancy and has Pilonidal cyst; Abdominal pain affecting pregnancy; Subchorionic hematoma in first trimester; Threatened miscarriage in early pregnancy; Supervision of high-risk pregnancy; Alpha thalassemia silent carrier; and Diabetes mellitus affecting pregnancy in second trimester on their problem list.  Patient reports nausea, vomiting and insomnia. has tried OTC remedies, not helping.   .  .   . Denies any leaking of fluid.   The following portions of the patient's history were reviewed and updated as appropriate: allergies, current medications, past family history, past medical history, past social history, past surgical history and problem list.   Objective:  There were no vitals filed for this visit.  Fetal Status:           General:  Alert, oriented and cooperative. Patient is in no acute distress.  Respiratory: Normal respiratory effort, no problems with respiration noted  Mental Status: Normal mood and affect. Normal behavior. Normal judgment and thought content.  Rest of physical exam deferred due to type of encounter  Imaging: US MFM OB FOLLOW UP  Result Date:  12/24/2019 ----------------------------------------------------------------------  OBSTETRICS REPORT                       (Signed Final 12/24/2019 02:07 pm) ---------------------------------------------------------------------- Patient Info  ID #:       [id number]                          D.O.B.:  Nov 07, 1995 (24 yrs)  Name:       Roberta Lutz                  Visit Date: 12/24/2019 09:11 am ---------------------------------------------------------------------- Performed By  Attending:        Lin Landsmanorenthian Booker      Ref. [facility]  Performed By:     Eden Lathearrie Stalter BS  [facility]  Referred By:      Johnny Bridge MD ---------------------------------------------------------------------- Orders  #  Description                           Code        Ordered By  1  [place] MFM OB FOLLOW UP                   [id number]    Noralee Space ----------------------------------------------------------------------  #  Order #                     Accession #                Episode #  1  [id number]                   [id number]                 [id number] ---------------------------------------------------------------------- Indications  Antenatal follow-up for nonvisualized fetal     Z36.2  anatomy  Pre-existing diabetes, type 2, in pregnancy,    O24.112  second trimester (diet controlled)  [redacted] weeks gestation of pregnancy                 Z3A.22 ---------------------------------------------------------------------- Fetal Evaluation  Num Of Fetuses:          1  Cardiac Activity:        Observed  Presentation:             Cephalic  Placenta:                Posterior  P. Cord Insertion:       Visualized  Amniotic Fluid  AFI FV:      Within normal limits                              Largest Pocket(cm)                              5.8 ---------------------------------------------------------------------- Biometry  BPD:      55.9  mm  G. Age:  23w 0d         80  %    CI:          77.3  %    70 - 86                                                          FL/HC:       17.5  %    18.4 - 20.2  HC:      201.3  mm     G. Age:  22w 2d         43  %    HC/AC:       1.09       1.06 - 1.25  AC:      185.4  mm     G. Age:  23w 2d         79  %    FL/BPD:      63.1  %    71 - 87  FL:       35.3  mm     G. Age:  21w 1d         12  %    FL/AC:       19.0  %    20 - 24  HUM:        34  mm     G. Age:  21w 4d         34  %  CER:      25.1  mm     G. Age:  22w 6d         88  %  Est. FW:     500   g      1 lb 2 oz     57  %                     m ---------------------------------------------------------------------- OB History  Gravidity:    1         Term:   0        Prem:   0         SAB:   0  TOP:          0       Ectopic:  0        Living: 0 ---------------------------------------------------------------------- Gestational Age  LMP:           22w 1d        Date:  07/22/19                 EDD:    04/27/20  U/S Today:     22w 3d                                        EDD:    04/25/20  Best:          22w 1d     Det. By:  LMP  (07/22/19)          EDD:    04/27/20 ---------------------------------------------------------------------- Anatomy  Cranium:               Appears  normal         Aortic Arch:            Appears normal  Cavum:                 Appears normal         Ductal Arch:            Appears normal  Ventricles:            Appears normal         Diaphragm:              Previously seen  Choroid Plexus:        Appears normal         Stomach:                Appears normal,                                                                         left sided  Cerebellum:            Appears normal         Abdomen:                Appears normal  Posterior Fossa:       Appears normal         Abdominal Wall:         Previously seen  Nuchal Fold:           Previously seen        Cord Vessels:           Previously seen  Face:                  Appears normal         Kidneys:                Appear normal                         (orbits and profile)  Lips:                  Previously seen        Bladder:                Appears normal  Thoracic:              Appears normal         Spine:                  Previously seen  Heart:                 Appears normal;        Upper Extremities:      Previously seen                         EIF prev vis  RVOT:                  Appears normal         Lower Extremities:      Previously seen  LVOT:  Previously seen  Other:  Fetus appears to be a female. Heels and 5th digit previously          visualized. Nasal bone visualized. 3VV previously visualized. ---------------------------------------------------------------------- Cervix Uterus Adnexa  Cervix  Length:           3.89  cm.  Normal appearance by transabdominal scan.  Uterus  No abnormality visualized.  Right Ovary  Within normal limits.  Left Ovary  Within normal limits.  Cul De Sac  No free fluid seen.  Adnexa  No abnormality visualized. ---------------------------------------------------------------------- Impression  Single intrauterine pregnancy here for a  follow up  examination to complete the fetal anatomy and due to T2 DM  managemen with diet  Normal interval growth with measurements consistent with  dates  There is good fetal movement and amniotic fluid volume  Anatomy completed today.  Ms Chamorro has a fetal echocardiogram scheduled in  September. ---------------------------------------------------------------------- Recommendations  Follow up growth in 4 weeks  Initiate weekly testing at 32 weeks.  Initiate daily low dose ASA if not previously  started.  Consider delivery between 37-39 weeks pending fetal status  and maternal blood glucose control ----------------------------------------------------------------------               Lin Landsman, MD Electronically Signed Final Report   12/24/2019 02:07 pm ----------------------------------------------------------------------   Assessment and Plan:  Pregnancy: G1P0 at [redacted]w[redacted]d 1. Diabetes mellitus affecting pregnancy in second trimester CBGs under good control with diet.  Continue scans as per MFM. Antenatal testing later in pregnancy.  2. Nausea and vomiting in pregnancy Phenergan prescribed as needed. - promethazine (PHENERGAN) 25 MG tablet; Take 1 tablet (25 mg total) by mouth every 6 (six) hours as needed for nausea or vomiting.  Dispense: 30 tablet; Refill: 2  3. Other insomnia Ambien prescribed. Cautioned about dependence, but will monitor response. - zolpidem (AMBIEN) 5 MG tablet; Take 1 tablet (5 mg total) by mouth at bedtime as needed for sleep.  Dispense: 30 tablet; Refill: 1  4. [redacted] weeks gestation of pregnancy 5. Supervision of high risk pregnancy in second trimester Preterm labor symptoms and general obstetric precautions including but not limited to vaginal bleeding, contractions, leaking of fluid and fetal movement were reviewed in detail with the patient. I discussed the assessment and treatment plan with the patient. The patient was provided an opportunity to ask questions and all were answered. The patient agreed with the plan and demonstrated an understanding of the instructions. The patient was advised to call back or seek an in-person office evaluation/go to MAU at [facility] for any urgent or concerning symptoms. Please refer to After Visit Summary for other counseling recommendations.   I provided 10 minutes of face-to-face time during this encounter.  Return for 01/19/2020 visit as scheduled.  Future Appointments  Date Time Provider  Department Center  01/07/2020  3:30 PM Myles Lipps, RD [facility] NDM  01/19/2020  3:00 PM Johnny Bridge, MD [facility] None  01/22/2020  9:30 AM [facility] NURSE [facility]  01/22/2020  9:45 AM [facility] US5 [facility]    Jaynie Collins, MD Center for [employer], [facility]

## 2020-01-07 ENCOUNTER — Ambulatory Visit: Payer: Managed Care, Other (non HMO) | Admitting: Skilled Nursing Facility1

## 2020-01-15 DIAGNOSIS — Z419 Encounter for procedure for purposes other than remedying health state, unspecified: Secondary | ICD-10-CM | POA: Diagnosis not present

## 2020-01-19 ENCOUNTER — Telehealth (INDEPENDENT_AMBULATORY_CARE_PROVIDER_SITE_OTHER): Payer: Managed Care, Other (non HMO) | Admitting: Obstetrics and Gynecology

## 2020-01-19 ENCOUNTER — Encounter: Payer: Self-pay | Admitting: Obstetrics and Gynecology

## 2020-01-19 DIAGNOSIS — O24912 Unspecified diabetes mellitus in pregnancy, second trimester: Secondary | ICD-10-CM

## 2020-01-19 DIAGNOSIS — Z3A25 25 weeks gestation of pregnancy: Secondary | ICD-10-CM

## 2020-01-19 DIAGNOSIS — D563 Thalassemia minor: Secondary | ICD-10-CM

## 2020-01-19 DIAGNOSIS — O289 Unspecified abnormal findings on antenatal screening of mother: Secondary | ICD-10-CM

## 2020-01-19 DIAGNOSIS — O0992 Supervision of high risk pregnancy, unspecified, second trimester: Secondary | ICD-10-CM

## 2020-01-19 MED ORDER — BLOOD PRESSURE KIT DEVI: 1.0000 | 0 refills | Status: DC

## 2020-01-19 NOTE — Patient Instructions (Signed)

## 2020-01-19 NOTE — Progress Notes (Signed)
OBSTETRICS PRENATAL VIRTUAL VISIT ENCOUNTER NOTE  Provider location: [facility]   I connected with Roberta Lutz on 01/19/20 at  3:00 PM EDT by MyChart Video Encounter at home and verified that I am speaking with the correct person using two identifiers.   I discussed the limitations, risks, security and privacy concerns of performing an evaluation and management service virtually and the availability of in person appointments. I also discussed with the patient that there may be a patient responsible charge related to this service. The patient expressed understanding and agreed to proceed. Subjective:  Roberta Lutz is a 24 y.o. G1P0 at [redacted]w[redacted]d being seen today for ongoing prenatal care.  She is currently monitored for the following issues for this high-risk pregnancy and has Pilonidal cyst; Abdominal pain affecting pregnancy; Subchorionic hematoma in first trimester; Threatened miscarriage in early pregnancy; Supervision of high-risk pregnancy; Alpha thalassemia silent carrier; and Diabetes mellitus affecting pregnancy in second trimester on their problem list.  Patient reports occasional contractions.  Contractions: Irritability. Vag. Bleeding: None.  Movement: Present. Denies any leaking of fluid.   The patient admits that she has not been checking her blood sugars since the fasting on 01/04/2020, which was 96.  She states that she is having difficulty rescheduling the appointments for the fetal echo and the diabetic counselor secondary to her work schedule.   The patient is unable to take her blood pressure today.  She states that she has not received an order for a blood pressure cuff.  The following portions of the patient's history were reviewed and updated as appropriate: allergies, current medications, past family history, past medical history, past social history, past surgical history and problem list.   Objective:  There were no vitals filed for this  visit.  Fetal Status:     Movement: Present     General:  Alert, oriented and cooperative. Patient is in no acute distress.  Respiratory: Normal respiratory effort, no problems with respiration noted  Mental Status: Normal mood and affect. Normal behavior. Normal judgment and thought content.  Rest of physical exam deferred due to type of encounter  Imaging: [place] MFM OB FOLLOW UP  Result Date: 12/24/2019 ----------------------------------------------------------------------  OBSTETRICS REPORT                       (Signed Final 12/24/2019 02:07 pm) ---------------------------------------------------------------------- Patient Info  ID #:       [id number]                          D.O.B.:  06/13/1995 (24 yrs)  Name:       Roberta Lutz                  Visit Date: 12/24/2019 09:11 am ---------------------------------------------------------------------- Performed By  Attending:        Lin Landsman      Ref. Address:      [facility]  Performed By:     Eden Lathe BS      Location[facility]  Referred By:      Johnny Bridge MD ---------------------------------------------------------------------- Orders  #  Description                           Code        Ordered By  1  [place] MFM OB FOLLOW UP                   [phone]    Noralee Space ----------------------------------------------------------------------  #  Order #                     Accession #                Episode #  1  [id number]                   [id number]                 [id number]  ---------------------------------------------------------------------- Indications  Antenatal follow-up for nonvisualized fetal     Z36.2  anatomy  Pre-existing diabetes, type 2, in pregnancy,    O24.112  second trimester (diet controlled)  [redacted] weeks gestation of pregnancy                 Z3A.22 ---------------------------------------------------------------------- Fetal Evaluation  Num Of Fetuses:          1  Cardiac Activity:        Observed  Presentation:            Cephalic  Placenta:                Posterior  P. Cord Insertion:       Visualized  Amniotic Fluid  AFI FV:      Within normal limits                              Largest Pocket(cm)  5.8 ---------------------------------------------------------------------- Biometry  BPD:      55.9  mm     G. Age:  23w 0d         80  %    CI:          77.3  %    70 - 86                                                          FL/HC:       17.5  %    18.4 - 20.2  HC:      201.3  mm     G. Age:  22w 2d         43  %    HC/AC:       1.09       1.06 - 1.25  AC:      185.4  mm     G. Age:  23w 2d         79  %    FL/BPD:      63.1  %    71 - 87  FL:       35.3  mm     G. Age:  21w 1d         12  %    FL/AC:       19.0  %    20 - 24  HUM:        34  mm     G. Age:  21w 4d         34  %  CER:      25.1  mm     G. Age:  22w 6d         88  %  Est. FW:     500   g      1 lb 2 oz     57  %                     m ---------------------------------------------------------------------- OB History  Gravidity:    1         Term:   0        Prem:   0         SAB:   0  TOP:          0       Ectopic:  0        Living: 0 ---------------------------------------------------------------------- Gestational Age  LMP:           22w 1d        Date:  07/22/19                 EDD:    04/27/20  U/S Today:     22w 3d                                        EDD:    04/25/20  Best:          22w 1d     Det. By:  LMP  (07/22/19)          EDD:    04/27/20  ----------------------------------------------------------------------  Anatomy  Cranium:               Appears normal         Aortic Arch:            Appears normal  Cavum:                 Appears normal         Ductal Arch:            Appears normal  Ventricles:            Appears normal         Diaphragm:              Previously seen  Choroid Plexus:        Appears normal         Stomach:                Appears normal,                                                                        left sided  Cerebellum:            Appears normal         Abdomen:                Appears normal  Posterior Fossa:       Appears normal         Abdominal Wall:         Previously seen  Nuchal Fold:           Previously seen        Cord Vessels:           Previously seen  Face:                  Appears normal         Kidneys:                Appear normal                         (orbits and profile)  Lips:                  Previously seen        Bladder:                Appears normal  Thoracic:              Appears normal         Spine:                  Previously seen  Heart:                 Appears normal;        Upper Extremities:      Previously seen                         EIF prev vis  RVOT:                  Appears normal  Lower Extremities:      Previously seen  LVOT:                  Previously seen  Other:  Fetus appears to be a female. Heels and 5th digit previously          visualized. Nasal bone visualized. 3VV previously visualized. ---------------------------------------------------------------------- Cervix Uterus Adnexa  Cervix  Length:           3.89  cm.  Normal appearance by transabdominal scan.  Uterus  No abnormality visualized.  Right Ovary  Within normal limits.  Left Ovary  Within normal limits.  Cul De Sac  No free fluid seen.  Adnexa  No abnormality visualized. ---------------------------------------------------------------------- Impression  Single intrauterine pregnancy here for a  follow up   examination to complete the fetal anatomy and due to T2 DM  managemen with diet  Normal interval growth with measurements consistent with  dates  There is good fetal movement and amniotic fluid volume  Anatomy completed today.  Ms Cliff has a fetal echocardiogram scheduled in  September. ---------------------------------------------------------------------- Recommendations  Follow up growth in 4 weeks  Initiate weekly testing at 32 weeks.  Initiate daily low dose ASA if not previously started.  Consider delivery between 37-39 weeks pending fetal status  and maternal blood glucose control ----------------------------------------------------------------------               Lin Landsman, MD Electronically Signed Final Report   12/24/2019 02:07 pm ----------------------------------------------------------------------   Assessment and Plan:  Pregnancy: G1P0 at [redacted]w[redacted]d 1. Supervision of high risk pregnancy in second trimester - Strict preterm labor precautions given.  Patient to report to office or hospital if she is experiencing more than 6 contractions per hour.    2. Alpha thalassemia silent carrier - Patient informed that partner needs testing.  3. Diabetes mellitus affecting pregnancy in second trimester - Patient strongly encouraged to record blood glucose levels fasting, and two hours after each meal. - Patient strongly encouraged to reschedule fetal echo.  Risks of fetal heart defects in the setting of diabetes in pregnancy discussed.   - Blood pressure cuff order resent.  Patient encouraged to pick up blood pressure cuff or all appointment will have to be in person.   - Patient strongly encouraged to reschedule appointment with diabetic counselor.    Preterm labor symptoms and general obstetric precautions including but not limited to vaginal bleeding, contractions, leaking of fluid and fetal movement were reviewed in detail with the patient. I discussed the assessment and treatment plan  with the patient. The patient was provided an opportunity to ask questions and all were answered. The patient agreed with the plan and demonstrated an understanding of the instructions. The patient was advised to call back or seek an in-person office evaluation/go to MAU at [facility] for any urgent or concerning symptoms. Please refer to After Visit Summary for other counseling recommendations.   I provided 20 minutes of face-to-face (virtual) time during this encounter.  Return in about 2 weeks (around 02/02/2020) for HROB virtual.  Future Appointments  Date Time Provider Department Center  01/22/2020  9:30 AM [facility] NURSE [facility]  01/22/2020  9:45 AM [facility] US5 [facility]    Johnny Bridge, MD Center for [facility], [facility]

## 2020-01-19 NOTE — Progress Notes (Signed)
I connected with  Roberta Lutz on 01/19/20 by a video enabled telemedicine application and verified that I am speaking with the correct person using two identifiers.   Patient is at work and I am at [facility]  I discussed the limitations of evaluation and management by telemedicine. The patient expressed understanding and agreed to proceed.   MyChart HOB c/o pain and pressure.

## 2020-01-22 ENCOUNTER — Ambulatory Visit: Payer: Managed Care, Other (non HMO) | Admitting: *Deleted

## 2020-01-22 ENCOUNTER — Ambulatory Visit: Payer: Managed Care, Other (non HMO) | Attending: Obstetrics and Gynecology

## 2020-01-22 ENCOUNTER — Other Ambulatory Visit: Payer: Self-pay | Admitting: *Deleted

## 2020-01-22 ENCOUNTER — Other Ambulatory Visit: Payer: Self-pay

## 2020-01-22 DIAGNOSIS — D563 Thalassemia minor: Secondary | ICD-10-CM

## 2020-01-22 DIAGNOSIS — Z3A26 26 weeks gestation of pregnancy: Secondary | ICD-10-CM | POA: Diagnosis not present

## 2020-01-22 DIAGNOSIS — O24410 Gestational diabetes mellitus in pregnancy, diet controlled: Secondary | ICD-10-CM

## 2020-01-22 DIAGNOSIS — O24119 Pre-existing diabetes mellitus, type 2, in pregnancy, unspecified trimester: Secondary | ICD-10-CM | POA: Diagnosis present

## 2020-01-22 DIAGNOSIS — Z362 Encounter for other antenatal screening follow-up: Secondary | ICD-10-CM

## 2020-01-22 DIAGNOSIS — E119 Type 2 diabetes mellitus without complications: Secondary | ICD-10-CM | POA: Diagnosis not present

## 2020-01-22 DIAGNOSIS — O24112 Pre-existing diabetes mellitus, type 2, in pregnancy, second trimester: Secondary | ICD-10-CM | POA: Diagnosis not present

## 2020-01-22 DIAGNOSIS — Z131 Encounter for screening for diabetes mellitus: Secondary | ICD-10-CM

## 2020-02-02 ENCOUNTER — Telehealth (INDEPENDENT_AMBULATORY_CARE_PROVIDER_SITE_OTHER): Payer: Managed Care, Other (non HMO) | Admitting: Advanced Practice Midwife

## 2020-02-02 DIAGNOSIS — O0992 Supervision of high risk pregnancy, unspecified, second trimester: Secondary | ICD-10-CM

## 2020-02-02 NOTE — Progress Notes (Signed)
Pt did not answer, LVM x2.

## 2020-02-02 NOTE — Progress Notes (Signed)
No show, pt not seen by provider.

## 2020-02-03 ENCOUNTER — Encounter (HOSPITAL_COMMUNITY): Payer: Self-pay | Admitting: Obstetrics & Gynecology

## 2020-02-03 ENCOUNTER — Inpatient Hospital Stay (HOSPITAL_COMMUNITY)
Admission: AD | Admit: 2020-02-03 | Discharge: 2020-02-03 | Disposition: A | Discharge status: Home / Self Care | Payer: Managed Care, Other (non HMO) | Attending: Obstetrics & Gynecology | Admitting: Obstetrics & Gynecology

## 2020-02-03 ENCOUNTER — Telehealth: Payer: Self-pay

## 2020-02-03 ENCOUNTER — Other Ambulatory Visit: Payer: Self-pay

## 2020-02-03 DIAGNOSIS — O99891 Other specified diseases and conditions complicating pregnancy: Secondary | ICD-10-CM

## 2020-02-03 DIAGNOSIS — Z3689 Encounter for other specified antenatal screening: Secondary | ICD-10-CM | POA: Insufficient documentation

## 2020-02-03 DIAGNOSIS — Z3A28 28 weeks gestation of pregnancy: Secondary | ICD-10-CM | POA: Diagnosis not present

## 2020-02-03 DIAGNOSIS — O212 Late vomiting of pregnancy: Secondary | ICD-10-CM | POA: Diagnosis present

## 2020-02-03 DIAGNOSIS — R42 Dizziness and giddiness: Secondary | ICD-10-CM

## 2020-02-03 DIAGNOSIS — E119 Type 2 diabetes mellitus without complications: Secondary | ICD-10-CM | POA: Diagnosis not present

## 2020-02-03 DIAGNOSIS — O26893 Other specified pregnancy related conditions, third trimester: Secondary | ICD-10-CM | POA: Insufficient documentation

## 2020-02-03 DIAGNOSIS — O24113 Pre-existing diabetes mellitus, type 2, in pregnancy, third trimester: Secondary | ICD-10-CM | POA: Insufficient documentation

## 2020-02-03 DIAGNOSIS — R519 Headache, unspecified: Secondary | ICD-10-CM | POA: Diagnosis not present

## 2020-02-03 DIAGNOSIS — O24912 Unspecified diabetes mellitus in pregnancy, second trimester: Secondary | ICD-10-CM

## 2020-02-03 DIAGNOSIS — R410 Disorientation, unspecified: Secondary | ICD-10-CM | POA: Diagnosis not present

## 2020-02-03 DIAGNOSIS — Z79899 Other long term (current) drug therapy: Secondary | ICD-10-CM | POA: Insufficient documentation

## 2020-02-03 LAB — URINALYSIS, ROUTINE W REFLEX MICROSCOPIC
Bilirubin Urine: NEGATIVE
Glucose, UA: 50 mg/dL — AB
Hgb urine dipstick: NEGATIVE
Ketones, ur: NEGATIVE mg/dL
Leukocytes,Ua: NEGATIVE
Nitrite: NEGATIVE
Protein, ur: NEGATIVE mg/dL
Specific Gravity, Urine: 1.014 (ref 1.005–1.030)
pH: 7 (ref 5.0–8.0)

## 2020-02-03 LAB — GLUCOSE, CAPILLARY: Glucose-Capillary: 95 mg/dL (ref 70–99)

## 2020-02-03 MED ORDER — BUTALBITAL-APAP-CAFFEINE 50-325-40 MG PO TABS
2.0000 | ORAL_TABLET | Freq: Once | ORAL | Status: AC
  Administered 2020-02-03: 2 via ORAL
  Filled 2020-02-03: qty 2

## 2020-02-03 NOTE — Telephone Encounter (Signed)
Return call to pt regarding concerns  Pt not ava LVM for pt to return call

## 2020-02-03 NOTE — MAU Provider Note (Signed)
History     CSN: [id number]  Arrival date and time: 02/03/20 1150   First Provider Initiated Contact with Patient 02/03/20 1318      Chief Complaint  Patient presents with  . Emesis  . Hypertension  . Dizziness  . disoriented   Roberta Lutz is a 24 y.o. G1P0 at 32w0dwho receives care at [facility].  She presents today for Emesis, Hypertension, Dizziness.  Patient states she was feeling dizzy and "trying to fight through it."  She states this occurred around 0800 and tried to eat once arriving at work at 0930ish.  She reports eating a bagel with cream cheese and cereal (captain crunch). She reports drinking water and Gatorade.  She reports the dizziness did not subside and she threw up about 30 minutes after she ate. She reports going to her manager who took her blood pressure, but she does not recall the value. She reports she also took her BS which was 197, but it was 76 this morning. She states the paramedics came and didn't recommend transport or fluids.  She states she is still experiencing some dizziness and "my head feels like it's gonna pop." Patient endorses fetal movement and occasional cramping that she thinks is BH contractions.  She states she has been experiencing the cramping throughout the pregnancy.  Patient denies vaginal discharge, bleeding, or leaking.      OB History    Gravida  1   Para      Term      Preterm      AB      Living  0     SAB      TAB      Ectopic      Multiple      Live Births              Past Medical History:  Diagnosis Date  . Anxiety   . Diabetes mellitus without complication ([place]    Type II  . Headache   . PCOS (polycystic ovarian syndrome)   . Pilonidal cyst   . Rosacea     Past Surgical History:  Procedure Laterality Date  . DRUG INDUCED ENDOSCOPY     Gastritis    Family History  Problem Relation Age of Onset  . Diabetes Mother   . Asthma Mother   . Diabetes Maternal Grandmother   . Arthritis  Maternal Grandmother   . ADD / ADHD Brother   . Asthma Brother   . Cancer Maternal Grandfather     Social History   Tobacco Use  . Smoking status: Never Smoker  . Smokeless tobacco: Never Used  Vaping Use  . Vaping Use: Never used  Substance Use Topics  . Alcohol use: No  . Drug use: No    Allergies: No Known Allergies  Medications Prior to Admission  Medication Sig Dispense Refill Last Dose  . promethazine (PHENERGAN) 25 MG tablet Take 1 tablet (25 mg total) by mouth every 6 (six) hours as needed for nausea or vomiting. 30 tablet 2 02/02/2020 at Unknown time  . sucralfate (CARAFATE) 1 g tablet Take 1 g by mouth 3 (three) times daily before meals.    02/02/2020 at Unknown time  . zolpidem (AMBIEN) 5 MG tablet Take 1 tablet (5 mg total) by mouth at bedtime as needed for sleep. 30 tablet 1 02/02/2020 at Unknown time  . Accu-Chek Softclix Lancets lancets Use as instructed 100 each 12   . acetaminophen (TYLENOL) 650 MG CR  tablet Take 650 mg by mouth every 8 (eight) hours as needed for pain. (Patient not taking: Reported on 10/20/2019)     . Blood Glucose Monitoring Suppl (ACCU-CHEK GUIDE) w/Device KIT 1 Device by Does not apply route 4 (four) times daily. 1 kit 0   . Blood Pressure Monitoring (BLOOD PRESSURE KIT) DEVI 1 kit by Does not apply route once a week. Check Blood Pressure regularly and record readings into the [employer] App.  Large Cuff.  DX O90.0 1 each 0   . diphenhydrAMINE HCl (BENADRYL PO) Take by mouth. (Patient not taking: Reported on 01/22/2020)     . Doxylamine Succinate, Sleep, (UNISOM PO) Take by mouth. (Patient not taking: Reported on 01/22/2020)     . glucose blood (ACCU-CHEK GUIDE) test strip Use to check blood sugars four times a day was instructed 50 each 12   . Melatonin 10 MG CAPS Take 10 mg by mouth at bedtime as needed (sleep). (Patient not taking: Reported on 10/20/2019)     . Prenatal Vit w/Fe-Methylfol-FA (PNV PO) Take by mouth.       Review of Systems   Constitutional: Negative for chills and fever.  Respiratory: Negative for cough and shortness of breath.   Gastrointestinal: Negative for nausea and vomiting.  Genitourinary: Negative for vaginal bleeding and vaginal discharge.  Neurological: Positive for dizziness, light-headedness and headaches (7/10 Pressure in front).   Physical Exam   Blood pressure 127/80, pulse (!) 101, temperature 98.5 F (36.9 C), temperature source Oral, resp. rate 20, height '5\' 3"'  (1.6 m), weight 97 kg, last menstrual period 07/22/2019, SpO2 100 %.  Physical Exam Constitutional:      Appearance: Normal appearance.  HENT:     Head: Normocephalic and atraumatic.  Eyes:     Conjunctiva/sclera: Conjunctivae normal.  Cardiovascular:     Rate and Rhythm: Normal rate and regular rhythm.     Heart sounds: Normal heart sounds.  Pulmonary:     Effort: Pulmonary effort is normal.     Breath sounds: Normal breath sounds.  Abdominal:     General: Bowel sounds are normal.  Musculoskeletal:        General: Normal range of motion.     Cervical back: Normal range of motion.  Skin:    General: Skin is warm and dry.  Neurological:     Mental Status: She is alert and oriented to person, place, and time.  Psychiatric:        Mood and Affect: Mood normal.        Behavior: Behavior normal.        Thought Content: Thought content normal.     Fetal Assessment 140 bpm, Mod Var, -Decels, +Accels Toco: No contractions graphed  MAU Course   Results for orders placed or performed during the hospital encounter of 02/03/20 (from the past 24 hour(s))  Urinalysis, Routine w reflex microscopic Urine, Clean Catch     Status: Abnormal   Collection Time: 02/03/20 12:14 PM  Result Value Ref Range   Color, Urine YELLOW YELLOW   APPearance HAZY (A) CLEAR   Specific Gravity, Urine 1.014 1.005 - 1.030   pH 7.0 5.0 - 8.0   Glucose, UA 50 (A) NEGATIVE mg/dL   Hgb urine dipstick NEGATIVE NEGATIVE   Bilirubin Urine NEGATIVE  NEGATIVE   Ketones, ur NEGATIVE NEGATIVE mg/dL   Protein, ur NEGATIVE NEGATIVE mg/dL   Nitrite NEGATIVE NEGATIVE   Leukocytes,Ua NEGATIVE NEGATIVE  Glucose, capillary     Status: None  Collection Time: 02/03/20 12:32 PM  Result Value Ref Range   Glucose-Capillary 95 70 - 99 mg/dL   No results found.  MDM PE Labs: UA, CBG EFM Orthostatic Vitals Assessment and Plan  24 year old G1P0  SIUP at 28weeks Cat I FT Headache  Dizziness DM-T2  -Exam findings discussed. -Reviewed lab and vitals. -Extensive discussion regarding management of diabetes and nutritional intake. -Encouraged increasing protein in diet especially when sugars are low normal. -Patient offered and accepts pain medication for HA. -Will give fioricet dosing.  -Will reassess.  -NST Reactive   Maryann Conners MSN, CNM 02/03/2020, 1:18 PM    Reassessment (2:58 PM) -Patient reports improvement in HA. -Referral placed for diabetic education. -Nurse instructed to inform patient of referral order. -Encouraged to call or return to MAU if symptoms worsen or with the onset of new symptoms. -Discharged to home in stable condition.  Maryann Conners MSN, CNM Advanced Practice Provider, [facility] for [facility]

## 2020-02-03 NOTE — Discharge Instructions (Signed)
Type 2 Diabetes Mellitus, Diagnosis, Adult Type 2 diabetes (type 2 diabetes mellitus) is a long-term (chronic) disease. In type 2 diabetes, one or both of these problems may be present:  The pancreas does not make enough of a hormone called insulin.  Cells in the body do not respond properly to insulin that the body makes (insulin resistance). Normally, insulin allows blood sugar (glucose) to enter cells in the body. The cells use glucose for energy. Insulin resistance or lack of insulin causes excess glucose to build up in the blood instead of going into cells. As a result, high blood glucose (hyperglycemia) develops. What increases the risk? The following factors may make you more likely to develop type 2 diabetes:  Having a family member with type 2 diabetes.  Being overweight or obese.  Having an inactive (sedentary) lifestyle.  Having been diagnosed with insulin resistance.  Having a history of prediabetes, gestational diabetes, or polycystic ovary syndrome (PCOS).  Being of American-Indian, African-American, Hispanic/Latino, or Asian/Pacific Islander descent. What are the signs or symptoms? In the early stage of this condition, you may not have symptoms. Symptoms develop slowly and may include:  Increased thirst (polydipsia).  Increased hunger(polyphagia).  Increased urination (polyuria).  Increased urination during the night (nocturia).  Unexplained weight loss.  Frequent infections that keep coming back (recurring).  Fatigue.  Weakness.  Vision changes, such as blurry vision.  Cuts or bruises that are slow to heal.  Tingling or numbness in the hands or feet.  Dark patches on the skin (acanthosis nigricans). How is this diagnosed? This condition is diagnosed based on your symptoms, your medical history, a physical exam, and your blood glucose level. Your blood glucose may be checked with one or more of the following blood tests:  A fasting blood glucose (FBG)  test. You will not be allowed to eat (you will fast) for 8 hours or longer before a blood sample is taken.  A random blood glucose test. This test checks blood glucose at any time of day regardless of when you ate.  An A1c (hemoglobin A1c) blood test. This test provides information about blood glucose control over the previous 2-3 months.  An oral glucose tolerance test (OGTT). This test measures your blood glucose at two times: ? After fasting. This is your baseline blood glucose level. ? Two hours after drinking a beverage that contains glucose. You may be diagnosed with type 2 diabetes if:  Your FBG level is 126 mg/dL (7.0 mmol/L) or higher.  Your random blood glucose level is 200 mg/dL (11.1 mmol/L) or higher.  Your A1c level is 6.5% or higher.  Your OGTT result is higher than 200 mg/dL (11.1 mmol/L). These blood tests may be repeated to confirm your diagnosis. How is this treated? Your treatment may be managed by a specialist called an endocrinologist. Type 2 diabetes may be treated by following instructions from your health care provider about:  Making diet and lifestyle changes. This may include: ? Following an individualized nutrition plan that is developed by a diet and nutrition specialist (registered dietitian). ? Exercising regularly. ? Finding ways to manage stress.  Checking your blood glucose level as often as told.  Taking diabetes medicines or insulin daily. This helps to keep your blood glucose levels in the healthy range. ? If you use insulin, you may need to adjust the dosage depending on how physically active you are and what foods you eat. Your health care provider will tell you how to adjust your dosage.    Taking medicines to help prevent complications from diabetes, such as: ? Aspirin. ? Medicine to lower cholesterol. ? Medicine to control blood pressure. Your health care provider will set individualized treatment goals for you. Your goals will be based on  your age, other medical conditions you have, and how you respond to diabetes treatment. Generally, the goal of treatment is to maintain the following blood glucose levels:  Before meals (preprandial): 80-130 mg/dL (4.4-7.2 mmol/L).  After meals (postprandial): below 180 mg/dL (10 mmol/L).  A1c level: less than 7%. Follow these instructions at home: Questions to ask your health care provider  Consider asking the following questions: ? Do I need to meet with a diabetes educator? ? Where can I find a support group for people with diabetes? ? What equipment will I need to manage my diabetes at home? ? What diabetes medicines do I need, and when should I take them? ? How often do I need to check my blood glucose? ? What number can I call if I have questions? ? When is my next appointment? General instructions  Take over-the-counter and prescription medicines only as told by your health care provider.  Keep all follow-up visits as told by your health care provider. This is important.  For more information about diabetes, visit: ? American Diabetes Association (ADA): www.diabetes.org ? American Association of Diabetes Educators (AADE): www.diabeteseducator.org Contact a health care provider if:  Your blood glucose is at or above 240 mg/dL (13.3 mmol/L) for 2 days in a row.  You have been sick or have had a fever for 2 days or longer, and you are not getting better.  You have any of the following problems for more than 6 hours: ? You cannot eat or drink. ? You have nausea and vomiting. ? You have diarrhea. Get help right away if:  Your blood glucose is lower than 54 mg/dL (3.0 mmol/L).  You become confused or you have trouble thinking clearly.  You have difficulty breathing.  You have moderate or large ketone levels in your urine. Summary  Type 2 diabetes (type 2 diabetes mellitus) is a long-term (chronic) disease. In type 2 diabetes, the pancreas does not make enough of a  hormone called insulin, or cells in the body do not respond properly to insulin that the body makes (insulin resistance).  This condition is treated by making diet and lifestyle changes and taking diabetes medicines or insulin.  Your health care provider will set individualized treatment goals for you. Your goals will be based on your age, other medical conditions you have, and how you respond to diabetes treatment.  Keep all follow-up visits as told by your health care provider. This is important. This information is not intended to replace advice given to you by your health care provider. Make sure you discuss any questions you have with your health care provider. Document Revised: 05/31/2017 Document Reviewed: 05/06/2015 Elsevier Patient Education  2020 Elsevier Inc.  

## 2020-02-03 NOTE — MAU Note (Signed)
Feeling very dizzy.  Happening all morning.  Tried to eat something.  Feeling has just gotten worse, feeling disoriented, can't get her thoughts together (though able to relay what has been going on and how she currently feels).  No hx of this. Was at work, paramedics came- BP was elevated.  Called her grandmother to come and bring her in. Was having some cramping, none since 1000. Denies bleeding or loss of fluid.  Has thrown up. Denies diarrhea, fever, cough or resp problems.

## 2020-02-11 ENCOUNTER — Other Ambulatory Visit: Payer: Self-pay

## 2020-02-11 MED ORDER — DOXYLAMINE-PYRIDOXINE 10-10 MG PO TBEC: 2.0000 | DELAYED_RELEASE_TABLET | Freq: Every day | ORAL | 5 refills | Status: DC

## 2020-02-11 NOTE — Progress Notes (Signed)
Diclegis sent to pt's pharmacy per protocol for nausea. Pt made aware.

## 2020-02-15 DIAGNOSIS — Z419 Encounter for procedure for purposes other than remedying health state, unspecified: Secondary | ICD-10-CM | POA: Diagnosis not present

## 2020-02-19 ENCOUNTER — Other Ambulatory Visit: Payer: Self-pay | Admitting: *Deleted

## 2020-02-19 ENCOUNTER — Other Ambulatory Visit: Payer: Self-pay

## 2020-02-19 ENCOUNTER — Ambulatory Visit: Payer: Managed Care, Other (non HMO) | Attending: Obstetrics and Gynecology

## 2020-02-19 ENCOUNTER — Encounter: Payer: Self-pay | Admitting: *Deleted

## 2020-02-19 ENCOUNTER — Ambulatory Visit: Payer: Managed Care, Other (non HMO) | Admitting: *Deleted

## 2020-02-19 DIAGNOSIS — O24113 Pre-existing diabetes mellitus, type 2, in pregnancy, third trimester: Secondary | ICD-10-CM

## 2020-02-19 DIAGNOSIS — Z362 Encounter for other antenatal screening follow-up: Secondary | ICD-10-CM

## 2020-02-19 DIAGNOSIS — E119 Type 2 diabetes mellitus without complications: Secondary | ICD-10-CM

## 2020-02-19 DIAGNOSIS — D563 Thalassemia minor: Secondary | ICD-10-CM | POA: Diagnosis not present

## 2020-02-19 DIAGNOSIS — O24410 Gestational diabetes mellitus in pregnancy, diet controlled: Secondary | ICD-10-CM | POA: Insufficient documentation

## 2020-02-19 DIAGNOSIS — Z3A30 30 weeks gestation of pregnancy: Secondary | ICD-10-CM

## 2020-03-04 ENCOUNTER — Ambulatory Visit: Payer: Managed Care, Other (non HMO) | Attending: Obstetrics and Gynecology

## 2020-03-04 ENCOUNTER — Ambulatory Visit: Payer: Managed Care, Other (non HMO) | Admitting: *Deleted

## 2020-03-04 ENCOUNTER — Encounter: Payer: Self-pay | Admitting: *Deleted

## 2020-03-04 ENCOUNTER — Other Ambulatory Visit: Payer: Self-pay

## 2020-03-04 DIAGNOSIS — E669 Obesity, unspecified: Secondary | ICD-10-CM

## 2020-03-04 DIAGNOSIS — Z3A32 32 weeks gestation of pregnancy: Secondary | ICD-10-CM

## 2020-03-04 DIAGNOSIS — O24410 Gestational diabetes mellitus in pregnancy, diet controlled: Secondary | ICD-10-CM | POA: Insufficient documentation

## 2020-03-04 DIAGNOSIS — D563 Thalassemia minor: Secondary | ICD-10-CM

## 2020-03-04 DIAGNOSIS — O24113 Pre-existing diabetes mellitus, type 2, in pregnancy, third trimester: Secondary | ICD-10-CM

## 2020-03-04 DIAGNOSIS — O99213 Obesity complicating pregnancy, third trimester: Secondary | ICD-10-CM | POA: Diagnosis not present

## 2020-03-07 ENCOUNTER — Other Ambulatory Visit: Payer: Self-pay | Admitting: *Deleted

## 2020-03-07 DIAGNOSIS — O24419 Gestational diabetes mellitus in pregnancy, unspecified control: Secondary | ICD-10-CM

## 2020-03-08 ENCOUNTER — Other Ambulatory Visit: Payer: Self-pay

## 2020-03-08 ENCOUNTER — Ambulatory Visit (INDEPENDENT_AMBULATORY_CARE_PROVIDER_SITE_OTHER): Payer: Managed Care, Other (non HMO) | Admitting: Obstetrics & Gynecology

## 2020-03-08 VITALS — BP 129/89 | HR 102 | Wt 223.0 lb

## 2020-03-08 DIAGNOSIS — O0993 Supervision of high risk pregnancy, unspecified, third trimester: Secondary | ICD-10-CM

## 2020-03-08 DIAGNOSIS — O24912 Unspecified diabetes mellitus in pregnancy, second trimester: Secondary | ICD-10-CM

## 2020-03-08 MED ORDER — ZOLPIDEM TARTRATE 5 MG PO TABS: 5.0000 mg | ORAL_TABLET | Freq: Every evening | ORAL | 0 refills | Status: DC | PRN

## 2020-03-08 NOTE — Progress Notes (Signed)
  Pt would like refill on Ambien.

## 2020-03-08 NOTE — Progress Notes (Signed)
   PRENATAL VISIT NOTE  Subjective:  Roberta Lutz is a 24 y.o. G1P0 at [redacted]w[redacted]d being seen today for ongoing prenatal care.  She is currently monitored for the following issues for this high-risk pregnancy and has Pilonidal cyst; Abdominal pain affecting pregnancy; Subchorionic hematoma in first trimester; Threatened miscarriage in early pregnancy; Supervision of high-risk pregnancy; Alpha thalassemia silent carrier; and Diabetes mellitus affecting pregnancy in second trimester on their problem list.  Patient reports no complaints.  Contractions: Irritability. Vag. Bleeding: None.  Movement: Present. Denies leaking of fluid.   The following portions of the patient's history were reviewed and updated as appropriate: allergies, current medications, past family history, past medical history, past social history, past surgical history and problem list.   Objective:   Vitals:   03/08/20 1624  BP: 129/89  Pulse: (!) 102  Weight: 223 lb (101.2 kg)    Fetal Status: Fetal Heart Rate (bpm): 140   Movement: Present     General:  Alert, oriented and cooperative. Patient is in no acute distress.  Skin: Skin is warm and dry. No rash noted.   Cardiovascular: Normal heart rate noted  Respiratory: Normal respiratory effort, no problems with respiration noted  Abdomen: Soft, gravid, appropriate for gestational age.  Pain/Pressure: Present     Pelvic: Cervical exam deferred        Extremities: Normal range of motion.  Edema: Trace  Mental Status: Normal mood and affect. Normal behavior. Normal judgment and thought content.   Assessment and Plan:  Pregnancy: G1P0 at [redacted]w[redacted]d 1. Supervision of high risk pregnancy in third trimester Sleep disorder, uses occasional sleep aid - zolpidem (AMBIEN) 5 MG tablet; Take 1 tablet (5 mg total) by mouth at bedtime as needed for sleep.  Dispense: 20 tablet; Refill: 0  2. Diabetes mellitus affecting pregnancy in second trimester Diet controlled based on nl FBS and PP,  never did GTT during pregnancy and A1C was 5.1. Will check with MFM as she may not need weekly testing  Preterm labor symptoms and general obstetric precautions including but not limited to vaginal bleeding, contractions, leaking of fluid and fetal movement were reviewed in detail with the patient. Please refer to After Visit Summary for other counseling recommendations.   Return in about 2 weeks (around 03/22/2020).  Future Appointments  Date Time Provider Department Center  03/18/2020  3:15 PM [facility] NURSE [facility]  03/18/2020  3:30 PM [facility]3 [facility]  03/22/2020  2:40 PM Thressa Sheller D, CNM [facility] None  03/25/2020  3:00 PM [facility] NURSE [facility]  03/25/2020  3:15 PM [facility]2 [facility]  04/01/2020  3:00 PM [facility] NURSE [facility]  04/01/2020  3:15 PM [facility]2 [facility]    Scheryl Darter, MD

## 2020-03-09 ENCOUNTER — Inpatient Hospital Stay (HOSPITAL_COMMUNITY)
Admission: AD | Admit: 2020-03-09 | Discharge: 2020-03-10 | Disposition: A | Discharge status: Home / Self Care | Payer: Managed Care, Other (non HMO) | Attending: Obstetrics & Gynecology | Admitting: Obstetrics & Gynecology

## 2020-03-09 ENCOUNTER — Encounter: Payer: Self-pay | Admitting: Obstetrics

## 2020-03-09 ENCOUNTER — Other Ambulatory Visit: Payer: Self-pay

## 2020-03-09 DIAGNOSIS — R109 Unspecified abdominal pain: Secondary | ICD-10-CM | POA: Insufficient documentation

## 2020-03-09 DIAGNOSIS — Z3A33 33 weeks gestation of pregnancy: Secondary | ICD-10-CM | POA: Insufficient documentation

## 2020-03-09 DIAGNOSIS — O26893 Other specified pregnancy related conditions, third trimester: Secondary | ICD-10-CM | POA: Insufficient documentation

## 2020-03-09 DIAGNOSIS — D563 Thalassemia minor: Secondary | ICD-10-CM | POA: Insufficient documentation

## 2020-03-09 DIAGNOSIS — O4703 False labor before 37 completed weeks of gestation, third trimester: Secondary | ICD-10-CM | POA: Insufficient documentation

## 2020-03-10 ENCOUNTER — Other Ambulatory Visit: Payer: Self-pay

## 2020-03-10 ENCOUNTER — Encounter (HOSPITAL_COMMUNITY): Payer: Self-pay | Admitting: Obstetrics & Gynecology

## 2020-03-10 DIAGNOSIS — O4703 False labor before 37 completed weeks of gestation, third trimester: Secondary | ICD-10-CM

## 2020-03-10 DIAGNOSIS — Z148 Genetic carrier of other disease: Secondary | ICD-10-CM | POA: Diagnosis not present

## 2020-03-10 DIAGNOSIS — O26893 Other specified pregnancy related conditions, third trimester: Secondary | ICD-10-CM | POA: Diagnosis present

## 2020-03-10 DIAGNOSIS — Z3A33 33 weeks gestation of pregnancy: Secondary | ICD-10-CM | POA: Diagnosis not present

## 2020-03-10 DIAGNOSIS — R109 Unspecified abdominal pain: Secondary | ICD-10-CM | POA: Diagnosis not present

## 2020-03-10 DIAGNOSIS — D563 Thalassemia minor: Secondary | ICD-10-CM | POA: Diagnosis not present

## 2020-03-10 LAB — URINALYSIS, ROUTINE W REFLEX MICROSCOPIC
Bilirubin Urine: NEGATIVE
Glucose, UA: NEGATIVE mg/dL
Hgb urine dipstick: NEGATIVE
Ketones, ur: NEGATIVE mg/dL
Leukocytes,Ua: NEGATIVE
Nitrite: NEGATIVE
Protein, ur: 30 mg/dL — AB
Specific Gravity, Urine: 1.009 (ref 1.005–1.030)
pH: 6 (ref 5.0–8.0)

## 2020-03-10 MED ORDER — NIFEDIPINE 10 MG PO CAPS: 10.0000 mg | ORAL_CAPSULE | Freq: Three times a day (TID) | ORAL | 1 refills | Status: DC

## 2020-03-10 MED ORDER — NIFEDIPINE 10 MG PO CAPS
10.0000 mg | ORAL_CAPSULE | ORAL | Status: DC | PRN
  Administered 2020-03-10 (×2): 10 mg via ORAL
  Filled 2020-03-10 (×3): qty 1

## 2020-03-10 NOTE — MAU Note (Signed)
Pt reports cramping constantly since 2100, also reports frequent urination. Denies bleeding. Reports good fetal movement.

## 2020-03-10 NOTE — MAU Provider Note (Signed)
Patient Roberta Lutz is a 24 y.o. G1P0  at [redacted]w[redacted]d here with complaints of cramping that started at 9 pm this evening. She denies LOF, decreased fetal movements, vaginal bleeding, no recent intercourse. She denies fever, SOB, UTI symptoms. She denies nv, constipation.   She is a Type-2 DM managed by diet.  History     CSN: [id number]  Arrival date and time: 03/09/20 2356   None     Chief Complaint  Patient presents with  . Abdominal Pain   Abdominal Pain This is a new problem. The current episode started in the past 7 days. The onset quality is gradual. The problem occurs constantly. The problem has been unchanged. Pain location: "moves all over" The pain is at a severity of 5/10. The quality of the pain is cramping. The abdominal pain does not radiate. Pertinent negatives include no constipation, diarrhea, nausea or vomiting.    OB History    Gravida  1   Para      Term      Preterm      AB      Living  0     SAB      TAB      Ectopic      Multiple      Live Births              Past Medical History:  Diagnosis Date  . Anxiety   . Diabetes mellitus without complication (HCC)    Type II  . Headache   . PCOS (polycystic ovarian syndrome)   . Pilonidal cyst   . Rosacea     Past Surgical History:  Procedure Laterality Date  . DRUG INDUCED ENDOSCOPY     Gastritis    Family History  Problem Relation Age of Onset  . Diabetes Mother   . Asthma Mother   . Diabetes Maternal Grandmother   . Arthritis Maternal Grandmother   . ADD / ADHD Brother   . Asthma Brother   . Cancer Maternal Grandfather     Social History   Tobacco Use  . Smoking status: Never Smoker  . Smokeless tobacco: Never Used  Vaping Use  . Vaping Use: Never used  Substance Use Topics  . Alcohol use: No  . Drug use: No    Allergies: No Known Allergies  Medications Prior to Admission  Medication Sig Dispense Refill Last Dose  . Doxylamine-Pyridoxine (DICLEGIS) 10-10 MG  TBEC Take 2 tablets by mouth at bedtime. If symptoms persist, add one tablet in the morning and one in the afternoon 100 tablet 5 03/09/2020 at 2100  . Prenatal Vit-Fe Fumarate-FA (PRENATAL MULTIVITAMIN) TABS tablet Take 1 tablet by mouth daily at 12 noon.   03/09/2020 at Unknown time  . zolpidem (AMBIEN) 5 MG tablet Take 1 tablet (5 mg total) by mouth at bedtime as needed for sleep. 20 tablet 0 03/09/2020 at 2100    Review of Systems  Constitutional: Negative.   HENT: Negative.   Respiratory: Negative.   Cardiovascular: Negative.   Gastrointestinal: Positive for abdominal pain. Negative for constipation, diarrhea, nausea and vomiting.  Genitourinary: Negative.   Neurological: Negative.    Physical Exam   Blood pressure (!) 123/93, pulse 66, temperature 98.5 F (36.9 C), resp. rate 18, height 5\' 3"  (1.6 m), weight 100.7 kg, last menstrual period 07/22/2019, SpO2 99 %.  Physical Exam Constitutional:      Appearance: She is well-developed.  HENT:     Head: Normocephalic.  Abdominal:  General: Abdomen is flat.     Palpations: Abdomen is soft.     Tenderness: There is no abdominal tenderness. There is no guarding or rebound.  Genitourinary:    Vagina: No vaginal discharge.     Cervix: Normal.  Skin:    General: Skin is warm and dry.  Neurological:     Mental Status: She is alert.     MAU Course  Procedures  MDM -ffn collected but not sent because cervix is closed, long and thick.  -will try procardia series and recheck in 1 hour -UA shows protein but no signs of infection, urine culture not sent.  -patient had 2 doses of procardia, reports that she has no contractions right now.  -Patient had one elevated BP while in MAu but was due to cuff size and after cuff was changed to large cuff, patient's BP was normal.  Patient Vitals for the past 24 hrs:  BP Temp Pulse Resp SpO2 Height Weight  03/10/20 0136 116/79 -- 94 -- -- -- --  03/10/20 0112 124/84 -- 77 -- -- -- --   03/10/20 0052 114/83 -- 88 -- -- -- --  03/10/20 0021 (!) 123/93 -- 66 -- -- -- --  03/10/20 0006 121/83 98.5 F (36.9 C) 83 18 99 % 5\' 3"  (1.6 m) 100.7 kg   Prior to discharge patient had cervix rechecked, still long, closed, thick.   Assessment and Plan   1. Preterm labor in third trimester without delivery   2. Alpha thalassemia silent carrier    2. Patient stable for discharge with return precautions.   3. Patient given PRN procardia to take as needed.   4. Keep appt for [facility] and follow up [facility] in early December.   January Roberta Lutz 03/10/2020, 12:41 AM

## 2020-03-10 NOTE — Discharge Instructions (Signed)
Preventing Preterm Birth Preterm birth is when your baby is delivered between 20 weeks and 37 weeks of pregnancy. A full-term pregnancy lasts for at least 37 weeks. Preterm birth can be dangerous for your baby because the last few weeks of pregnancy are an important time for your baby's brain and lungs to grow. Many things can cause a baby to be born early. Sometimes the cause is not known. There are certain factors that make you more likely to experience preterm birth, such as:  Having a previous baby born preterm.  Being pregnant with twins or other multiples.  Having had fertility treatment.  Being overweight or underweight at the start of your pregnancy.  Having any of the following during pregnancy: ? An infection, including a urinary tract infection (UTI) or an STI (sexually transmitted infection). ? High blood pressure. ? Diabetes. ? Vaginal bleeding.  Being age 35 or older.  Being age 18 or younger.  Getting pregnant within 6 months of a previous pregnancy.  Suffering extreme stress or physical or emotional abuse during pregnancy.  Standing for long periods of time during pregnancy, such as working at a job that requires standing. What are the risks? The most serious risk of preterm birth is that the baby may not survive. This is more likely to happen if a baby is born before 34 weeks. Other risks and complications of preterm birth may include your baby having:  Breathing problems.  Brain damage that affects movement and coordination (cerebral palsy).  Feeding difficulties.  Vision or hearing problems.  Infections or inflammation of the digestive tract (colitis).  Developmental delays.  Learning disabilities.  Higher risk for diabetes, heart disease, and high blood pressure later in life. What can I do to lower my risk?  Medical care The most important thing you can do to lower your risk for preterm birth is to get routine medical care during pregnancy (prenatal  care). If you have a high risk of preterm birth, you may be referred to a health care provider who specializes in managing high-risk pregnancies (perinatologist). You may be given medicine to help prevent preterm birth. Lifestyle changes Certain lifestyle changes can also lower your risk of preterm birth:  Wait at least 6 months after a pregnancy to become pregnant again.  Try to plan pregnancy for when you are between 19 and 35 years old.  Get to a healthy weight before getting pregnant. If you are overweight, work with your health care provider to safely lose weight.  Do not use any products that contain nicotine or tobacco, such as cigarettes and e-cigarettes. If you need help quitting, ask your health care provider.  Do not drink alcohol.  Do not use drugs. Where to find support For more support, consider:  Talking with your health care provider.  Talking with a therapist or substance abuse counselor, if you need help quitting.  Working with a diet and nutrition specialist (dietitian) or a personal trainer to maintain a healthy weight.  Joining a support group. Where to find more information Learn more about preventing preterm birth from:  Centers for Disease Control and Prevention: cdc.gov/reproductivehealth/maternalinfanthealth/pretermbirth.htm  March of Dimes: marchofdimes.org/complications/premature-babies.aspx  American Pregnancy Association: americanpregnancy.org/labor-and-birth/premature-labor Contact a health care provider if:  You have any of the following signs of preterm labor before 37 weeks: ? A change or increase in vaginal discharge. ? Fluid leaking from your vagina. ? Pressure or cramps in your lower abdomen. ? A backache that does not go away or gets worse. ?   Regular tightening (contractions) in your lower abdomen. Summary  Preterm birth means having your baby during weeks 20-37 of pregnancy.  Preterm birth may put your baby at risk for physical and  mental problems.  Getting good prenatal care can help prevent preterm birth.  You can lower your risk of preterm birth by making certain lifestyle changes, such as not smoking and not using alcohol. This information is not intended to replace advice given to you by your health care provider. Make sure you discuss any questions you have with your health care provider. Document Revised: 03/15/2017 Document Reviewed: 12/10/2015 Elsevier Patient Education  2020 Elsevier Inc.  

## 2020-03-16 DIAGNOSIS — Z419 Encounter for procedure for purposes other than remedying health state, unspecified: Secondary | ICD-10-CM | POA: Diagnosis not present

## 2020-03-18 ENCOUNTER — Other Ambulatory Visit: Payer: Self-pay | Admitting: Obstetrics

## 2020-03-18 ENCOUNTER — Other Ambulatory Visit: Payer: Self-pay

## 2020-03-18 ENCOUNTER — Encounter: Payer: Self-pay | Admitting: *Deleted

## 2020-03-18 ENCOUNTER — Ambulatory Visit: Payer: Managed Care, Other (non HMO) | Admitting: *Deleted

## 2020-03-18 ENCOUNTER — Ambulatory Visit: Payer: Managed Care, Other (non HMO) | Attending: Obstetrics and Gynecology

## 2020-03-18 DIAGNOSIS — O358XX0 Maternal care for other (suspected) fetal abnormality and damage, not applicable or unspecified: Secondary | ICD-10-CM

## 2020-03-18 DIAGNOSIS — O0993 Supervision of high risk pregnancy, unspecified, third trimester: Secondary | ICD-10-CM | POA: Insufficient documentation

## 2020-03-18 DIAGNOSIS — Z3A34 34 weeks gestation of pregnancy: Secondary | ICD-10-CM

## 2020-03-18 DIAGNOSIS — Z362 Encounter for other antenatal screening follow-up: Secondary | ICD-10-CM

## 2020-03-18 DIAGNOSIS — D563 Thalassemia minor: Secondary | ICD-10-CM | POA: Diagnosis present

## 2020-03-18 DIAGNOSIS — E669 Obesity, unspecified: Secondary | ICD-10-CM

## 2020-03-18 DIAGNOSIS — O24113 Pre-existing diabetes mellitus, type 2, in pregnancy, third trimester: Secondary | ICD-10-CM | POA: Diagnosis not present

## 2020-03-18 DIAGNOSIS — O365990 Maternal care for other known or suspected poor fetal growth, unspecified trimester, not applicable or unspecified: Secondary | ICD-10-CM | POA: Diagnosis present

## 2020-03-18 NOTE — Progress Notes (Signed)
C/o"clear fluid/wet underwear 30 min ago"

## 2020-03-18 NOTE — Procedures (Signed)
Roberta Lutz 11-Feb-1996 [redacted]w[redacted]d  Fetus A Non-Stress Test Interpretation for 03/18/20  Indication: Unsatisfactory BPP, FGR  Fetal Heart Rate A Mode: External Baseline Rate (A): 135 bpm Variability: Moderate Accelerations: 15 x 15 Decelerations: None Multiple birth?: No  Uterine Activity Mode: Palpation, Toco Contraction Frequency (min): none noted Resting Tone Palpated: Relaxed Resting Time: Adequate  Interpretation (Fetal Testing) Nonstress Test Interpretation: Reactive Comments: Reviewed tracing with Dr. Parke Poisson

## 2020-03-21 ENCOUNTER — Inpatient Hospital Stay (HOSPITAL_COMMUNITY)
Admission: AD | Admit: 2020-03-21 | Discharge: 2020-03-21 | Disposition: A | Discharge status: Home / Self Care | Payer: Managed Care, Other (non HMO) | Attending: Obstetrics and Gynecology | Admitting: Obstetrics and Gynecology

## 2020-03-21 ENCOUNTER — Other Ambulatory Visit: Payer: Self-pay

## 2020-03-21 ENCOUNTER — Encounter (HOSPITAL_COMMUNITY): Payer: Self-pay | Admitting: Obstetrics and Gynecology

## 2020-03-21 DIAGNOSIS — Z3A34 34 weeks gestation of pregnancy: Secondary | ICD-10-CM

## 2020-03-21 DIAGNOSIS — O4703 False labor before 37 completed weeks of gestation, third trimester: Secondary | ICD-10-CM | POA: Diagnosis not present

## 2020-03-21 DIAGNOSIS — Z3689 Encounter for other specified antenatal screening: Secondary | ICD-10-CM | POA: Diagnosis not present

## 2020-03-21 LAB — URINALYSIS, ROUTINE W REFLEX MICROSCOPIC
Bilirubin Urine: NEGATIVE
Glucose, UA: NEGATIVE mg/dL
Hgb urine dipstick: NEGATIVE
Ketones, ur: NEGATIVE mg/dL
Leukocytes,Ua: NEGATIVE
Nitrite: NEGATIVE
Protein, ur: 30 mg/dL — AB
Specific Gravity, Urine: 1.008 (ref 1.005–1.030)
pH: 5 (ref 5.0–8.0)

## 2020-03-21 LAB — POCT FERN TEST: POCT Fern Test: NEGATIVE

## 2020-03-21 MED ORDER — LACTATED RINGERS IV BOLUS
1000.0000 mL | Freq: Once | INTRAVENOUS | Status: AC
  Administered 2020-03-21: 1000 mL via INTRAVENOUS

## 2020-03-21 NOTE — Discharge Instructions (Signed)
Braxton Hicks Contractions °Contractions of the uterus can occur throughout pregnancy, but they are not always a sign that you are in labor. You may have practice contractions called Braxton Hicks contractions. These false labor contractions are sometimes confused with true labor. °What are Braxton Hicks contractions? °Braxton Hicks contractions are tightening movements that occur in the muscles of the uterus before labor. Unlike true labor contractions, these contractions do not result in opening (dilation) and thinning of the cervix. Toward the end of pregnancy (32-34 weeks), Braxton Hicks contractions can happen more often and may become stronger. These contractions are sometimes difficult to tell apart from true labor because they can be very uncomfortable. You should not feel embarrassed if you go to the hospital with false labor. °Sometimes, the only way to tell if you are in true labor is for your health care provider to look for changes in the cervix. The health care provider will do a physical exam and may monitor your contractions. If you are not in true labor, the exam should show that your cervix is not dilating and your water has not broken. °If there are no other health problems associated with your pregnancy, it is completely safe for you to be sent home with false labor. You may continue to have Braxton Hicks contractions until you go into true labor. °How to tell the difference between true labor and false labor °True labor °· Contractions last 30-70 seconds. °· Contractions become very regular. °· Discomfort is usually felt in the top of the uterus, and it spreads to the lower abdomen and low back. °· Contractions do not go away with walking. °· Contractions usually become more intense and increase in frequency. °· The cervix dilates and gets thinner. °False labor °· Contractions are usually shorter and not as strong as true labor contractions. °· Contractions are usually irregular. °· Contractions  are often felt in the front of the lower abdomen and in the groin. °· Contractions may go away when you walk around or change positions while lying down. °· Contractions get weaker and are shorter-lasting as time goes on. °· The cervix usually does not dilate or become thin. °Follow these instructions at home: ° °· Take over-the-counter and prescription medicines only as told by your health care provider. °· Keep up with your usual exercises and follow other instructions from your health care provider. °· Eat and drink lightly if you think you are going into labor. °· If Braxton Hicks contractions are making you uncomfortable: °? Change your position from lying down or resting to walking, or change from walking to resting. °? Sit and rest in a tub of warm water. °? Drink enough fluid to keep your urine pale yellow. Dehydration may cause these contractions. °? Do slow and deep breathing several times an hour. °· Keep all follow-up prenatal visits as told by your health care provider. This is important. °Contact a health care provider if: °· You have a fever. °· You have continuous pain in your abdomen. °Get help right away if: °· Your contractions become stronger, more regular, and closer together. °· You have fluid leaking or gushing from your vagina. °· You pass blood-tinged mucus (bloody show). °· You have bleeding from your vagina. °· You have low back pain that you never had before. °· You feel your baby’s head pushing down and causing pelvic pressure. °· Your baby is not moving inside you as much as it used to. °Summary °· Contractions that occur before labor are   called Braxton Hicks contractions, false labor, or practice contractions. °· Braxton Hicks contractions are usually shorter, weaker, farther apart, and less regular than true labor contractions. True labor contractions usually become progressively stronger and regular, and they become more frequent. °· Manage discomfort from Braxton Hicks contractions  by changing position, resting in a warm bath, drinking plenty of water, or practicing deep breathing. °This information is not intended to replace advice given to you by your health care provider. Make sure you discuss any questions you have with your health care provider. °Document Revised: 03/15/2017 Document Reviewed: 08/16/2016 °Elsevier Patient Education © 2020 Elsevier Inc. ° °

## 2020-03-21 NOTE — MAU Provider Note (Addendum)
History     CSN: [id number]  Arrival date and time: 03/21/20 1758   First Provider Initiated Contact with Patient 03/21/20 1942      Chief Complaint  Patient presents with  . Contractions  . Rupture of Membranes   HPI This is a 24 yo G1 at [redacted]w[redacted]d who presents with contractions that started around 4 o'clock. Has procardia at home, which she took without improvement. She also admits to leaking fluid. No palliating or provoking factors. Reports no abnormal discharge, bleeding, decreased movement.  OB History    Gravida  1   Para      Term      Preterm      AB      Living  0     SAB      TAB      Ectopic      Multiple      Live Births              Past Medical History:  Diagnosis Date  . Anxiety   . Diabetes mellitus without complication (HCC)    Type II  . Headache   . PCOS (polycystic ovarian syndrome)   . Pilonidal cyst   . Rosacea     Past Surgical History:  Procedure Laterality Date  . DRUG INDUCED ENDOSCOPY     Gastritis    Family History  Problem Relation Age of Onset  . Diabetes Mother   . Asthma Mother   . Diabetes Maternal Grandmother   . Arthritis Maternal Grandmother   . ADD / ADHD Brother   . Asthma Brother   . Cancer Maternal Grandfather     Social History   Tobacco Use  . Smoking status: Never Smoker  . Smokeless tobacco: Never Used  Vaping Use  . Vaping Use: Never used  Substance Use Topics  . Alcohol use: No  . Drug use: No    Allergies: No Known Allergies  Medications Prior to Admission  Medication Sig Dispense Refill Last Dose  . Doxylamine-Pyridoxine (DICLEGIS) 10-10 MG TBEC Take 2 tablets by mouth at bedtime. If symptoms persist, add one tablet in the morning and one in the afternoon 100 tablet 5 Past Week at Unknown time  . NIFEdipine (PROCARDIA) 10 MG capsule Take 1 capsule (10 mg total) by mouth 3 (three) times daily. 30 capsule 1 03/21/2020 at Unknown time  . Prenatal Vit-Fe Fumarate-FA (PRENATAL  MULTIVITAMIN) TABS tablet Take 1 tablet by mouth daily at 12 noon.   03/20/2020 at Unknown time  . zolpidem (AMBIEN) 5 MG tablet Take 1 tablet (5 mg total) by mouth at bedtime as needed for sleep. 20 tablet 0 03/20/2020 at Unknown time    Review of Systems Physical Exam   Blood pressure (!) 126/93, pulse 81, temperature 98.6 F (37 C), temperature source Oral, resp. rate 18, height 5\' 3"  (1.6 m), weight 100.5 kg, last menstrual period 07/22/2019, SpO2 100 %.  Physical Exam Vitals reviewed. Exam conducted with a chaperone present.  Constitutional:      Appearance: Normal appearance.  Cardiovascular:     Rate and Rhythm: Normal rate and regular rhythm.  Skin:    Capillary Refill: Capillary refill takes less than 2 seconds.  Neurological:     General: No focal deficit present.     Mental Status: She is alert.  Psychiatric:        Mood and Affect: Mood normal.        Behavior: Behavior normal.  Thought Content: Thought content normal.        Judgment: Judgment normal.    Dilation: Closed Effacement (%): Thick Exam by:: Dr. Adrian Blackwater  Results for orders placed or performed during the hospital encounter of 03/21/20 (from the past 24 hour(s))  Urinalysis, Routine w reflex microscopic Urine, Clean Catch     Status: Abnormal   Collection Time: 03/21/20  6:53 PM  Result Value Ref Range   Color, Urine STRAW (A) YELLOW   APPearance CLEAR CLEAR   Specific Gravity, Urine 1.008 1.005 - 1.030   pH 5.0 5.0 - 8.0   Glucose, UA NEGATIVE NEGATIVE mg/dL   Hgb urine dipstick NEGATIVE NEGATIVE   Bilirubin Urine NEGATIVE NEGATIVE   Ketones, ur NEGATIVE NEGATIVE mg/dL   Protein, ur 30 (A) NEGATIVE mg/dL   Nitrite NEGATIVE NEGATIVE   Leukocytes,Ua NEGATIVE NEGATIVE   RBC / HPF 0-5 0 - 5 RBC/hpf   WBC, UA 0-5 0 - 5 WBC/hpf   Bacteria, UA RARE (A) NONE SEEN   Squamous Epithelial / LPF 0-5 0 - 5   Mucus PRESENT   Fern Test     Status: Normal   Collection Time: 03/21/20  8:04 PM  Result  Value Ref Range   POCT Fern Test Negative = intact amniotic membranes    MAU Course  Procedures NST reactive  MDM IV fluid bolus Fern negative Care turned over to [employer], CNM. Levie Heritage 03/21/2020  Pt feels better, no longer feeling ctx, rare ctx on toco. Cervix unchanged. Stable for discharge home.  Assessment and Plan   1. [redacted] weeks gestation of pregnancy   2. NST (non-stress test) reactive   3. Preterm uterine contractions, antepartum, third trimester    Discharge home Follow up at [facility] as scheduled  PTL precautions  Allergies as of 03/21/2020   No Known Allergies     Medication List    TAKE these medications   Doxylamine-Pyridoxine 10-10 MG Tbec Commonly known as: Diclegis Take 2 tablets by mouth at bedtime. If symptoms persist, add one tablet in the morning and one in the afternoon   NIFEdipine 10 MG capsule Commonly known as: Procardia Take 1 capsule (10 mg total) by mouth 3 (three) times daily.   prenatal multivitamin Tabs tablet Take 1 tablet by mouth daily at 12 noon.   zolpidem 5 MG tablet Commonly known as: AMBIEN Take 1 tablet (5 mg total) by mouth at bedtime as needed for sleep.       Donette Larry, CNM  03/21/2020 8:43 PM

## 2020-03-21 NOTE — MAU Note (Signed)
Having contractions, started around 1630, every 2-3 min, lasting about a min. Denies bleeding, unsure about leaking.  "Lately the entire seat of her underwear has been drenched", noted last Friday.

## 2020-03-22 ENCOUNTER — Encounter: Payer: Managed Care, Other (non HMO) | Admitting: Advanced Practice Midwife

## 2020-03-22 ENCOUNTER — Encounter: Payer: Managed Care, Other (non HMO) | Admitting: Obstetrics and Gynecology

## 2020-03-23 ENCOUNTER — Other Ambulatory Visit: Payer: Self-pay

## 2020-03-23 ENCOUNTER — Ambulatory Visit (INDEPENDENT_AMBULATORY_CARE_PROVIDER_SITE_OTHER): Payer: Managed Care, Other (non HMO) | Admitting: Obstetrics and Gynecology

## 2020-03-23 VITALS — BP 132/91 | HR 90 | Wt 224.1 lb

## 2020-03-23 DIAGNOSIS — G5600 Carpal tunnel syndrome, unspecified upper limb: Secondary | ICD-10-CM

## 2020-03-23 DIAGNOSIS — O24113 Pre-existing diabetes mellitus, type 2, in pregnancy, third trimester: Secondary | ICD-10-CM

## 2020-03-23 DIAGNOSIS — Z3A35 35 weeks gestation of pregnancy: Secondary | ICD-10-CM

## 2020-03-23 DIAGNOSIS — D563 Thalassemia minor: Secondary | ICD-10-CM

## 2020-03-23 DIAGNOSIS — O0993 Supervision of high risk pregnancy, unspecified, third trimester: Secondary | ICD-10-CM

## 2020-03-23 DIAGNOSIS — O99350 Diseases of the nervous system complicating pregnancy, unspecified trimester: Secondary | ICD-10-CM

## 2020-03-23 NOTE — Progress Notes (Signed)
Patient presents for ROB. Patient denies having any headache or visual changes. She does complain of having tingling in her fingertips. No other concerns.

## 2020-03-23 NOTE — Progress Notes (Signed)
   PRENATAL VISIT NOTE  Subjective:  Roberta Lutz is a 24 y.o. G1P0 at [redacted]w[redacted]d being seen today for ongoing prenatal care.  She is currently monitored for the following issues for this high-risk pregnancy and has Pilonidal cyst; Abdominal pain affecting pregnancy; Subchorionic hematoma in first trimester; Threatened miscarriage in early pregnancy; Supervision of high-risk pregnancy; Alpha thalassemia silent carrier; Type 2 diabetes mellitus during pregnancy, third trimester; [redacted] weeks gestation of pregnancy; and Pregnancy related carpal tunnel syndrome, antepartum on their problem list.  Patient doing well with no acute concerns today. She reports no complaints.  Contractions: Irritability. Vag. Bleeding: None.  Movement: Present. Denies leaking of fluid.   The following portions of the patient's history were reviewed and updated as appropriate: allergies, current medications, past family history, past medical history, past social history, past surgical history and problem list. Problem list updated.  Objective:   Vitals:   03/23/20 1612 03/23/20 1616  BP: (!) 149/84 (!) 132/91  Pulse: 87 90  Weight: 224 lb 1.6 oz (101.7 kg)     Fetal Status:     Movement: Present     General:  Alert, oriented and cooperative. Patient is in no acute distress.  Skin: Skin is warm and dry. No rash noted.   Cardiovascular: Normal heart rate noted  Respiratory: Normal respiratory effort, no problems with respiration noted  Abdomen: Soft, gravid, appropriate for gestational age.  Pain/Pressure: Present     Pelvic: Cervical exam deferred        Extremities: Normal range of motion.  Edema: Trace  Mental Status:  Normal mood and affect. Normal behavior. Normal judgment and thought content.   Assessment and Plan:  Pregnancy: G1P0 at [redacted]w[redacted]d  1. Supervision of high risk pregnancy in third trimester   2. Alpha thalassemia silent carrier   3. [redacted] weeks gestation of pregnancy   4. Type 2 diabetes mellitus  during pregnancy, third trimester There is some controversy if pt is truly a type 2 DM.  She states an outside physician gave the diagnosis when she was being worked up for PCOS.  Currently she is diet controlled with reasonable blood sugar control.  Hgb A1c was 5.1.  Pt should be thoroughly evaluated after delivery  5. Pregnancy related carpal tunnel syndrome, antepartum positive Tinnel's sign, mild carpal tunnel syndrome  Preterm labor symptoms and general obstetric precautions including but not limited to vaginal bleeding, contractions, leaking of fluid and fetal movement were reviewed in detail with the patient.  Please refer to After Visit Summary for other counseling recommendations.   Return in about 1 week (around 03/30/2020) for [facility], in person, 36 weeks swabs.   Mariel Aloe, MD

## 2020-03-23 NOTE — Patient Instructions (Signed)

## 2020-03-25 ENCOUNTER — Encounter: Payer: Self-pay | Admitting: *Deleted

## 2020-03-25 ENCOUNTER — Ambulatory Visit: Payer: Managed Care, Other (non HMO) | Admitting: *Deleted

## 2020-03-25 ENCOUNTER — Ambulatory Visit: Payer: Managed Care, Other (non HMO) | Attending: Obstetrics and Gynecology

## 2020-03-25 ENCOUNTER — Other Ambulatory Visit: Payer: Self-pay | Admitting: Obstetrics and Gynecology

## 2020-03-25 ENCOUNTER — Other Ambulatory Visit: Payer: Self-pay

## 2020-03-25 DIAGNOSIS — O99213 Obesity complicating pregnancy, third trimester: Secondary | ICD-10-CM | POA: Diagnosis not present

## 2020-03-25 DIAGNOSIS — E119 Type 2 diabetes mellitus without complications: Secondary | ICD-10-CM

## 2020-03-25 DIAGNOSIS — E669 Obesity, unspecified: Secondary | ICD-10-CM

## 2020-03-25 DIAGNOSIS — O24113 Pre-existing diabetes mellitus, type 2, in pregnancy, third trimester: Secondary | ICD-10-CM

## 2020-03-25 DIAGNOSIS — O365930 Maternal care for other known or suspected poor fetal growth, third trimester, not applicable or unspecified: Secondary | ICD-10-CM | POA: Diagnosis not present

## 2020-03-25 DIAGNOSIS — D563 Thalassemia minor: Secondary | ICD-10-CM

## 2020-03-25 DIAGNOSIS — O24419 Gestational diabetes mellitus in pregnancy, unspecified control: Secondary | ICD-10-CM | POA: Insufficient documentation

## 2020-03-25 DIAGNOSIS — Z3A35 35 weeks gestation of pregnancy: Secondary | ICD-10-CM

## 2020-04-01 ENCOUNTER — Encounter: Payer: Self-pay | Admitting: *Deleted

## 2020-04-01 ENCOUNTER — Ambulatory Visit: Payer: Managed Care, Other (non HMO) | Admitting: *Deleted

## 2020-04-01 ENCOUNTER — Other Ambulatory Visit: Payer: Self-pay

## 2020-04-01 ENCOUNTER — Other Ambulatory Visit: Payer: Self-pay | Admitting: Obstetrics and Gynecology

## 2020-04-01 ENCOUNTER — Ambulatory Visit: Payer: Managed Care, Other (non HMO) | Attending: Obstetrics and Gynecology

## 2020-04-01 DIAGNOSIS — Z3A36 36 weeks gestation of pregnancy: Secondary | ICD-10-CM | POA: Diagnosis not present

## 2020-04-01 DIAGNOSIS — O24419 Gestational diabetes mellitus in pregnancy, unspecified control: Secondary | ICD-10-CM

## 2020-04-01 DIAGNOSIS — D563 Thalassemia minor: Secondary | ICD-10-CM | POA: Insufficient documentation

## 2020-04-01 NOTE — Progress Notes (Signed)
C/o" N&V x 5 days with occ diarrhea-unable to keep anything down including water, denies fever, pressure in chest and mid abd, upper back pain has worsened."

## 2020-04-04 ENCOUNTER — Other Ambulatory Visit: Payer: Self-pay | Admitting: *Deleted

## 2020-04-04 DIAGNOSIS — O365990 Maternal care for other known or suspected poor fetal growth, unspecified trimester, not applicable or unspecified: Secondary | ICD-10-CM

## 2020-04-06 ENCOUNTER — Ambulatory Visit (HOSPITAL_BASED_OUTPATIENT_CLINIC_OR_DEPARTMENT_OTHER): Payer: Managed Care, Other (non HMO)

## 2020-04-06 ENCOUNTER — Inpatient Hospital Stay (HOSPITAL_COMMUNITY)
Admission: AD | Admit: 2020-04-06 | Discharge: 2020-04-08 | DRG: 806 | Disposition: A | Discharge status: Home / Self Care | Payer: Managed Care, Other (non HMO) | Attending: Obstetrics and Gynecology | Admitting: Obstetrics and Gynecology

## 2020-04-06 ENCOUNTER — Encounter: Payer: Self-pay | Admitting: *Deleted

## 2020-04-06 ENCOUNTER — Inpatient Hospital Stay (HOSPITAL_COMMUNITY): Payer: Managed Care, Other (non HMO) | Admitting: Anesthesiology

## 2020-04-06 ENCOUNTER — Ambulatory Visit: Payer: Managed Care, Other (non HMO) | Admitting: *Deleted

## 2020-04-06 ENCOUNTER — Other Ambulatory Visit: Payer: Self-pay | Admitting: Family Medicine

## 2020-04-06 ENCOUNTER — Encounter (HOSPITAL_COMMUNITY): Payer: Self-pay | Admitting: Obstetrics and Gynecology

## 2020-04-06 ENCOUNTER — Other Ambulatory Visit: Payer: Self-pay

## 2020-04-06 DIAGNOSIS — O24113 Pre-existing diabetes mellitus, type 2, in pregnancy, third trimester: Secondary | ICD-10-CM

## 2020-04-06 DIAGNOSIS — O99213 Obesity complicating pregnancy, third trimester: Secondary | ICD-10-CM | POA: Diagnosis not present

## 2020-04-06 DIAGNOSIS — Z3A37 37 weeks gestation of pregnancy: Secondary | ICD-10-CM

## 2020-04-06 DIAGNOSIS — O2412 Pre-existing diabetes mellitus, type 2, in childbirth: Secondary | ICD-10-CM | POA: Diagnosis present

## 2020-04-06 DIAGNOSIS — O365990 Maternal care for other known or suspected poor fetal growth, unspecified trimester, not applicable or unspecified: Secondary | ICD-10-CM | POA: Insufficient documentation

## 2020-04-06 DIAGNOSIS — O134 Gestational [pregnancy-induced] hypertension without significant proteinuria, complicating childbirth: Secondary | ICD-10-CM | POA: Diagnosis not present

## 2020-04-06 DIAGNOSIS — E119 Type 2 diabetes mellitus without complications: Secondary | ICD-10-CM | POA: Diagnosis present

## 2020-04-06 DIAGNOSIS — D563 Thalassemia minor: Secondary | ICD-10-CM | POA: Diagnosis present

## 2020-04-06 DIAGNOSIS — O4103X0 Oligohydramnios, third trimester, not applicable or unspecified: Secondary | ICD-10-CM | POA: Diagnosis present

## 2020-04-06 DIAGNOSIS — O1414 Severe pre-eclampsia complicating childbirth: Secondary | ICD-10-CM | POA: Diagnosis present

## 2020-04-06 DIAGNOSIS — O1415 Severe pre-eclampsia, complicating the puerperium: Secondary | ICD-10-CM | POA: Diagnosis not present

## 2020-04-06 DIAGNOSIS — O0993 Supervision of high risk pregnancy, unspecified, third trimester: Secondary | ICD-10-CM

## 2020-04-06 DIAGNOSIS — O1413 Severe pre-eclampsia, third trimester: Secondary | ICD-10-CM | POA: Diagnosis present

## 2020-04-06 DIAGNOSIS — O365930 Maternal care for other known or suspected poor fetal growth, third trimester, not applicable or unspecified: Secondary | ICD-10-CM

## 2020-04-06 DIAGNOSIS — Z20822 Contact with and (suspected) exposure to covid-19: Secondary | ICD-10-CM | POA: Diagnosis present

## 2020-04-06 DIAGNOSIS — O139 Gestational [pregnancy-induced] hypertension without significant proteinuria, unspecified trimester: Secondary | ICD-10-CM

## 2020-04-06 HISTORY — DX: Gestational (pregnancy-induced) hypertension without significant proteinuria, unspecified trimester: O13.9

## 2020-04-06 LAB — COMPREHENSIVE METABOLIC PANEL
ALT: 41 U/L (ref 0–44)
ALT: 45 U/L — ABNORMAL HIGH (ref 0–44)
AST: 61 U/L — ABNORMAL HIGH (ref 15–41)
AST: 59 U/L — ABNORMAL HIGH (ref 15–41)
Albumin: 2.8 g/dL — ABNORMAL LOW (ref 3.5–5.0)
Albumin: 2.6 g/dL — ABNORMAL LOW (ref 3.5–5.0)
Alkaline Phosphatase: 223 U/L — ABNORMAL HIGH (ref 38–126)
Alkaline Phosphatase: 221 U/L — ABNORMAL HIGH (ref 38–126)
Anion gap: 13 (ref 5–15)
Anion gap: 12 (ref 5–15)
BUN: 8 mg/dL (ref 6–20)
BUN: 8 mg/dL (ref 6–20)
CO2: 17 mmol/L — ABNORMAL LOW (ref 22–32)
CO2: 20 mmol/L — ABNORMAL LOW (ref 22–32)
Calcium: 8.8 mg/dL — ABNORMAL LOW (ref 8.9–10.3)
Calcium: 9 mg/dL (ref 8.9–10.3)
Chloride: 103 mmol/L (ref 98–111)
Chloride: 104 mmol/L (ref 98–111)
Creatinine, Ser: 1.09 mg/dL — ABNORMAL HIGH (ref 0.44–1.00)
Creatinine, Ser: 1.13 mg/dL — ABNORMAL HIGH (ref 0.44–1.00)
GFR, Estimated: >60 mL/min (ref >60)
GFR, Estimated: >60 mL/min (ref >60)
Glucose, Bld: 88 mg/dL (ref 70–99)
Glucose, Bld: 96 mg/dL (ref 70–99)
Potassium: 5.3 mmol/L — ABNORMAL HIGH (ref 3.5–5.1)
Potassium: 4.2 mmol/L (ref 3.5–5.1)
Sodium: 133 mmol/L — ABNORMAL LOW (ref 135–145)
Sodium: 136 mmol/L (ref 135–145)
Total Bilirubin: 0.6 mg/dL (ref 0.3–1.2)
Total Bilirubin: 0.6 mg/dL (ref 0.3–1.2)
Total Protein: 7.4 g/dL (ref 6.5–8.1)
Total Protein: 6.8 g/dL (ref 6.5–8.1)

## 2020-04-06 LAB — GLUCOSE, CAPILLARY: Glucose-Capillary: 68 mg/dL — ABNORMAL LOW (ref 70–99)

## 2020-04-06 LAB — RESP PANEL BY RT-PCR (FLU A&B, COVID) ARPGX2
Influenza A by PCR: NEGATIVE
Influenza B by PCR: NEGATIVE
SARS Coronavirus 2 by RT PCR: NEGATIVE

## 2020-04-06 LAB — CBC
HCT: 42.6 % (ref 36.0–46.0)
HCT: 40.6 % (ref 36.0–46.0)
Hemoglobin: 13.6 g/dL (ref 12.0–15.0)
Hemoglobin: 13.1 g/dL (ref 12.0–15.0)
MCH: 25.7 pg — ABNORMAL LOW (ref 26.0–34.0)
MCH: 25.5 pg — ABNORMAL LOW (ref 26.0–34.0)
MCHC: 31.9 g/dL (ref 30.0–36.0)
MCHC: 32.3 g/dL (ref 30.0–36.0)
MCV: 80.4 fL (ref 80.0–100.0)
MCV: 79 fL — ABNORMAL LOW (ref 80.0–100.0)
Platelets: 283 10*3/uL (ref 150–400)
Platelets: 255 10*3/uL (ref 150–400)
RBC: 5.3 MIL/uL — ABNORMAL HIGH (ref 3.87–5.11)
RBC: 5.14 MIL/uL — ABNORMAL HIGH (ref 3.87–5.11)
RDW: 14.2 % (ref 11.5–15.5)
RDW: 14.2 % (ref 11.5–15.5)
WBC: 12.7 10*3/uL — ABNORMAL HIGH (ref 4.0–10.5)
WBC: 13.9 10*3/uL — ABNORMAL HIGH (ref 4.0–10.5)
nRBC: 0 % (ref 0.0–0.2)
nRBC: 0 % (ref 0.0–0.2)

## 2020-04-06 LAB — PROTEIN / CREATININE RATIO, URINE
Creatinine, Urine: 40.1 mg/dL
Protein Creatinine Ratio: 1.9 mg/mg{Cre} — ABNORMAL HIGH (ref 0.00–0.15)
Total Protein, Urine: 76 mg/dL

## 2020-04-06 LAB — TYPE AND SCREEN
ABO/RH(D): O POS
Antibody Screen: NEGATIVE

## 2020-04-06 MED ORDER — OXYTOCIN-SODIUM CHLORIDE 30-0.9 UT/500ML-% IV SOLN: 1.0000 m[IU]/min | INTRAVENOUS | Status: DC

## 2020-04-06 MED ORDER — ACETAMINOPHEN 325 MG PO TABS: 650.0000 mg | ORAL_TABLET | ORAL | Status: DC | PRN

## 2020-04-06 MED ORDER — MISOPROSTOL 50MCG HALF TABLET
50.0000 ug | ORAL_TABLET | ORAL | Status: DC | PRN
  Administered 2020-04-06: 17:00:00 50 ug via ORAL
  Filled 2020-04-06: qty 1

## 2020-04-06 MED ORDER — BUPIVACAINE HCL (PF) 0.25 % IJ SOLN
INTRAMUSCULAR | Status: DC | PRN
  Administered 2020-04-06: 5 mL via EPIDURAL
  Administered 2020-04-06: 3 mL via EPIDURAL

## 2020-04-06 MED ORDER — EPHEDRINE 5 MG/ML INJ: 10.0000 mg | INTRAVENOUS | Status: DC | PRN

## 2020-04-06 MED ORDER — SOD CITRATE-CITRIC ACID 500-334 MG/5ML PO SOLN: 30.0000 mL | ORAL | Status: DC | PRN

## 2020-04-06 MED ORDER — FENTANYL CITRATE (PF) 100 MCG/2ML IJ SOLN
100.0000 ug | INTRAMUSCULAR | Status: DC | PRN
  Administered 2020-04-06 (×2): 100 ug via INTRAVENOUS
  Filled 2020-04-06 (×2): qty 2

## 2020-04-06 MED ORDER — LIDOCAINE HCL (PF) 1 % IJ SOLN: 30.0000 mL | INTRAMUSCULAR | Status: DC | PRN

## 2020-04-06 MED ORDER — OXYCODONE-ACETAMINOPHEN 5-325 MG PO TABS
2.0000 | ORAL_TABLET | ORAL | Status: DC | PRN
Start: 2020-04-06 — End: 2020-04-07

## 2020-04-06 MED ORDER — LIDOCAINE HCL (PF) 1 % IJ SOLN
INTRAMUSCULAR | Status: DC | PRN
  Administered 2020-04-06: 5 mL via EPIDURAL
  Administered 2020-04-06: 2 mL via EPIDURAL
  Administered 2020-04-06: 3 mL via EPIDURAL

## 2020-04-06 MED ORDER — PHENYLEPHRINE 40 MCG/ML (10ML) SYRINGE FOR IV PUSH (FOR BLOOD PRESSURE SUPPORT): 80.0000 ug | PREFILLED_SYRINGE | INTRAVENOUS | Status: DC | PRN

## 2020-04-06 MED ORDER — OXYCODONE-ACETAMINOPHEN 5-325 MG PO TABS: 1.0000 | ORAL_TABLET | ORAL | Status: DC | PRN

## 2020-04-06 MED ORDER — MISOPROSTOL 25 MCG QUARTER TABLET: 25.0000 ug | ORAL_TABLET | ORAL | Status: DC | PRN

## 2020-04-06 MED ORDER — OXYTOCIN-SODIUM CHLORIDE 30-0.9 UT/500ML-% IV SOLN
2.5000 [IU]/h | INTRAVENOUS | Status: DC
  Administered 2020-04-07: 01:00:00 2.5 [IU]/h via INTRAVENOUS
  Filled 2020-04-06: qty 500

## 2020-04-06 MED ORDER — LACTATED RINGERS IV SOLN: 500.0000 mL | Freq: Once | INTRAVENOUS | Status: DC

## 2020-04-06 MED ORDER — TERBUTALINE SULFATE 1 MG/ML IJ SOLN: 0.2500 mg | Freq: Once | INTRAMUSCULAR | Status: DC | PRN

## 2020-04-06 MED ORDER — LACTATED RINGERS IV SOLN: INTRAVENOUS | Status: DC

## 2020-04-06 MED ORDER — LACTATED RINGERS IV SOLN: 500.0000 mL | INTRAVENOUS | Status: DC | PRN

## 2020-04-06 MED ORDER — SODIUM CHLORIDE (PF) 0.9 % IJ SOLN
INTRAMUSCULAR | Status: DC | PRN
  Administered 2020-04-06: 12 mL/h via EPIDURAL

## 2020-04-06 MED ORDER — FENTANYL-BUPIVACAINE-NACL 0.5-0.125-0.9 MG/250ML-% EP SOLN
12.0000 mL/h | EPIDURAL | Status: DC | PRN
  Filled 2020-04-06: qty 250

## 2020-04-06 MED ORDER — DIPHENHYDRAMINE HCL 50 MG/ML IJ SOLN: 12.5000 mg | INTRAMUSCULAR | Status: DC | PRN

## 2020-04-06 MED ORDER — OXYTOCIN BOLUS FROM INFUSION
333.0000 mL | Freq: Once | INTRAVENOUS | Status: AC
  Administered 2020-04-07: 01:00:00 333 mL via INTRAVENOUS

## 2020-04-06 MED ORDER — ONDANSETRON HCL 4 MG/2ML IJ SOLN
4.0000 mg | Freq: Four times a day (QID) | INTRAMUSCULAR | Status: DC | PRN
  Administered 2020-04-06: 21:00:00 4 mg via INTRAVENOUS
  Filled 2020-04-06: qty 2

## 2020-04-06 NOTE — Anesthesia Preprocedure Evaluation (Signed)
Anesthesia Evaluation  Patient identified by MRN, date of birth, ID band Patient awake    Reviewed: Allergy & Precautions, Patient's Chart, lab work & pertinent test results  Airway Mallampati: II       Dental   Pulmonary neg pulmonary ROS,    Pulmonary exam normal        Cardiovascular hypertension, Normal cardiovascular exam     Neuro/Psych  Neuromuscular disease    GI/Hepatic negative GI ROS, Neg liver ROS,   Endo/Other  diabetes, Type 2  Renal/GU negative Renal ROS     Musculoskeletal   Abdominal   Peds  Hematology negative hematology ROS (+)   Anesthesia Other Findings   Reproductive/Obstetrics (+) Pregnancy                             Anesthesia Physical Anesthesia Plan  ASA: III  Anesthesia Plan: Epidural   Post-op Pain Management:    Induction:   PONV Risk Score and Plan: Treatment may vary due to age or medical condition  Airway Management Planned: Natural Airway  Additional Equipment:   Intra-op Plan:   Post-operative Plan:   Informed Consent: I have reviewed the patients History and Physical, chart, labs and discussed the procedure including the risks, benefits and alternatives for the proposed anesthesia with the patient or authorized representative who has indicated his/her understanding and acceptance.       Plan Discussed with:   Anesthesia Plan Comments:         Anesthesia Quick Evaluation

## 2020-04-06 NOTE — Anesthesia Procedure Notes (Signed)
Epidural Patient location during procedure: OB Start time: 04/06/2020 9:40 PM End time: 04/06/2020 9:47 PM  Staffing Anesthesiologist: Marcene Duos, MD Performed: anesthesiologist   Preanesthetic Checklist Completed: patient identified, IV checked, site marked, risks and benefits discussed, surgical consent, monitors and equipment checked, pre-op evaluation and timeout performed  Epidural Patient position: sitting Prep: DuraPrep and site prepped and draped Patient monitoring: continuous pulse ox and blood pressure Approach: midline Location: L4-L5 Injection technique: LOR air  Needle:  Needle type: Tuohy  Needle gauge: 17 G Needle length: 9 cm and 9 Needle insertion depth: 7 cm Catheter type: closed end flexible Catheter size: 19 Gauge Catheter at skin depth: 12 cm Test dose: negative  Assessment Events: blood not aspirated, injection not painful, no injection resistance, no paresthesia and negative IV test

## 2020-04-06 NOTE — Progress Notes (Signed)
Blood glucose level was 68. Patient instructed to drink apple juice. Provider made aware. Will continue to monitor.  Mellody Dance, RN

## 2020-04-06 NOTE — H&P (Signed)
OBSTETRIC ADMISSION HISTORY AND PHYSICAL  Roberta Lutz is a 24 y.o. female G1P0 with IUP at 101w0d by LMP presenting for IOL secondary to IUGR (EFW <1% at [redacted]w[redacted]d). Pregnancy also complicated by gHTN and T2DM. She reports +FMs, No LOF, no VB, no blurry vision, headaches or peripheral edema, and RUQ pain.  She plans on breast feeding. She is undecided for birth control.  She received her prenatal care at [facility]   Dating: By LMP --->  Estimated Date of Delivery: 04/27/20  Sono:  @[redacted]w[redacted]d , CWD, normal anatomy, cephalic presentation, 1855g, EFW, normal Dopplers  Prenatal History/Complications:  - IUGR - T2DM (no medications during or outside of pregnancy) - gHTN >Preeclampsia without severe features (UP:C 1.9 on admission) -alpha thal carrier  Past Medical History: Past Medical History:  Diagnosis Date  . Anxiety   . Diabetes mellitus without complication (HCC)    Type II  . Headache   . PCOS (polycystic ovarian syndrome)   . Pilonidal cyst   . Rosacea     Past Surgical History: Past Surgical History:  Procedure Laterality Date  . DRUG INDUCED ENDOSCOPY     Gastritis    Obstetrical History: OB History    Gravida  1   Para      Term      Preterm      AB      Living  0     SAB      IAB      Ectopic      Multiple      Live Births              Social History Social History   Socioeconomic History  . Marital status: Single    Spouse name: Not on file  . Number of children: Not on file  . Years of education: Not on file  . Highest education level: Not on file  Occupational History  . Not on file  Tobacco Use  . Smoking status: Never Smoker  . Smokeless tobacco: Never Used  Vaping Use  . Vaping Use: Never used  Substance and Sexual Activity  . Alcohol use: No  . Drug use: No  . Sexual activity: Yes    Birth control/protection: None  Other Topics Concern  . Not on file  Social History Narrative  . Not on file   Social Determinants of  Health   Financial Resource Strain: Not on file  Food Insecurity: Not on file  Transportation Needs: Not on file  Physical Activity: Not on file  Stress: Not on file  Social Connections: Not on file    Family History: Family History  Problem Relation Age of Onset  . Diabetes Mother   . Asthma Mother   . Diabetes Maternal Grandmother   . Arthritis Maternal Grandmother   . ADD / ADHD Brother   . Asthma Brother   . Cancer Maternal Grandfather     Allergies: No Known Allergies  Medications Prior to Admission  Medication Sig Dispense Refill Last Dose  . Doxylamine-Pyridoxine (DICLEGIS) 10-10 MG TBEC Take 2 tablets by mouth at bedtime. If symptoms persist, add one tablet in the morning and one in the afternoon 100 tablet 5   . NIFEdipine (PROCARDIA) 10 MG capsule Take 1 capsule (10 mg total) by mouth 3 (three) times daily. (Patient not taking: Reported on 03/23/2020) 30 capsule 1   . Prenatal Vit-Fe Fumarate-FA (PRENATAL MULTIVITAMIN) TABS tablet Take 1 tablet by mouth daily at 12 noon.     14/11/2019  zolpidem (AMBIEN) 5 MG tablet Take 1 tablet (5 mg total) by mouth at bedtime as needed for sleep. 20 tablet 0      Review of Systems   All systems reviewed and negative except as stated in HPI  Blood pressure (!) 133/93, pulse 80, resp. rate 18, height 5\' 3"  (1.6 m), weight 99.8 kg, last menstrual period 07/22/2019. General appearance: alert, cooperative and appears stated age Lungs: normal WOB Heart: regular rate Abdomen: soft, non-tender Extremities: no sign of DVT Presentation: cephalic on bedside ultrasound Fetal monitoringBaseline: 135 bpm, Variability: Good {> 6 bpm), Accelerations: Reactive and Decelerations: Absent Uterine activity: irregular Dilation: 1 Effacement (%): Thick Station: -2 Exam by:: [place]   Prenatal labs: ABO, Rh: --/--/O POS (12/22 1521) Antibody: NEG (12/22 1521) Rubella: 1.93 (07/12 1350) RPR: Non Reactive (07/12 1350)  HBsAg: Negative (07/12 1350)   HIV: Non Reactive (07/12 1350)  GBS:  pending on admission A1c 5.1% on 12/22/19 Genetic screening wnl except for alpha thal carrier Anatomy 02/21/20 wnl  Prenatal Transfer Tool  Maternal Diabetes: Type 2 Diabetes w/o medications Genetic Screening: wnl except for alpha thal carrier Maternal Ultrasounds/Referrals: Normal Fetal Ultrasounds or other Referrals:  Referred to [facility] , normal fetal echo on 12/16/19 Maternal Substance Abuse:  No Significant Maternal Medications:  None Significant Maternal Lab Results: GBS unknown  Results for orders placed or performed during the hospital encounter of 04/06/20 (from the past 24 hour(s))  CBC   Collection Time: 04/06/20  3:21 PM  Result Value Ref Range   WBC 12.7 (H) 4.0 - 10.5 K/uL   RBC 5.30 (H) 3.87 - 5.11 MIL/uL   Hemoglobin 13.6 12.0 - 15.0 g/dL   HCT 04/08/20 63.0 - 16.0 %   MCV 80.4 80.0 - 100.0 fL   MCH 25.7 (L) 26.0 - 34.0 pg   MCHC 31.9 30.0 - 36.0 g/dL   RDW 10.9 32.3 - 55.7 %   Platelets 283 150 - 400 K/uL   nRBC 0.0 0.0 - 0.2 %  Comprehensive metabolic panel   Collection Time: 04/06/20  3:21 PM  Result Value Ref Range   Sodium 133 (L) 135 - 145 mmol/L   Potassium 5.3 (H) 3.5 - 5.1 mmol/L   Chloride 103 98 - 111 mmol/L   CO2 17 (L) 22 - 32 mmol/L   Glucose, Bld 88 70 - 99 mg/dL   BUN 8 6 - 20 mg/dL   Creatinine, Ser 04/08/20 (H) 0.44 - 1.00 mg/dL   Calcium 8.8 (L) 8.9 - 10.3 mg/dL   Total Protein 7.4 6.5 - 8.1 g/dL   Albumin 2.8 (L) 3.5 - 5.0 g/dL   AST 61 (H) 15 - 41 U/L   ALT 41 0 - 44 U/L   Alkaline Phosphatase 223 (H) 38 - 126 U/L   Total Bilirubin 0.6 0.3 - 1.2 mg/dL   GFR, Estimated 0.25 >42 mL/min   Anion gap 13 5 - 15  Protein / creatinine ratio, urine   Collection Time: 04/06/20  3:21 PM  Result Value Ref Range   Creatinine, Urine 40.10 mg/dL   Total Protein, Urine 76 mg/dL   Protein Creatinine Ratio 1.90 (H) 0.00 - 0.15 mg/mg[Cre]  Type and screen   Collection Time: 04/06/20  3:21 PM  Result Value  Ref Range   ABO/RH(D) O POS    Antibody Screen NEG    Sample Expiration      04/09/2020,2359 Performed at [facility], [address], [place], [place]  Resp Panel by RT-PCR (Flu A&B, Covid) Urine, Clean Catch   Collection Time: 04/06/20  3:39 PM   Specimen: Urine, Clean Catch; Nasopharyngeal(NP) swabs in vial transport medium  Result Value Ref Range   SARS Coronavirus 2 by RT PCR NEGATIVE NEGATIVE   Influenza A by PCR NEGATIVE NEGATIVE   Influenza B by PCR NEGATIVE NEGATIVE    Patient Active Problem List   Diagnosis Date Noted  . Gestational hypertension 04/06/2020  . [redacted] weeks gestation of pregnancy 03/23/2020  . Pregnancy related carpal tunnel syndrome, antepartum 03/23/2020  . Type 2 diabetes mellitus during pregnancy, third trimester 11/24/2019  . Alpha thalassemia silent carrier 11/09/2019  . Supervision of high-risk pregnancy 10/20/2019  . Abdominal pain affecting pregnancy 09/22/2019  . Subchorionic hematoma in first trimester 09/22/2019  . Threatened miscarriage in early pregnancy 09/22/2019  . Pilonidal cyst 07/03/2011    Assessment/Plan:  Roberta Lutz is a 24 y.o. G1P0 at [redacted]w[redacted]d here for IOL secondary to IUGR and gHTN.  #Labor: FB and cytotec placed on admission. Plan to transition to pitocin as clinically indicated. #Pain: plan for epidural #FWB: Category 1 strip #ID: GBS unknown >PCR collected on admission #MOF: breast #MOC: unsure; counseled on admission #Circ: desired #gHTN: normal to mild range blood pressures on admission. Asymptomatic. Preeclampsia labs obtained on admission. #T2DM: A1c 5.1% most recently. No medications in or outside of pregnancy. #Alpha Thal Carrier: peds aware  Sheila Oats, MD OB Fellow, Faculty Practice 04/06/2020 8:37 PM

## 2020-04-07 ENCOUNTER — Encounter (HOSPITAL_COMMUNITY): Payer: Self-pay | Admitting: Family Medicine

## 2020-04-07 ENCOUNTER — Encounter: Payer: Managed Care, Other (non HMO) | Admitting: Obstetrics and Gynecology

## 2020-04-07 DIAGNOSIS — O1415 Severe pre-eclampsia, complicating the puerperium: Secondary | ICD-10-CM

## 2020-04-07 DIAGNOSIS — O365930 Maternal care for other known or suspected poor fetal growth, third trimester, not applicable or unspecified: Secondary | ICD-10-CM

## 2020-04-07 DIAGNOSIS — T380X5A Adverse effect of glucocorticoids and synthetic analogues, initial encounter: Secondary | ICD-10-CM | POA: Insufficient documentation

## 2020-04-07 DIAGNOSIS — O1413 Severe pre-eclampsia, third trimester: Secondary | ICD-10-CM | POA: Diagnosis present

## 2020-04-07 DIAGNOSIS — E119 Type 2 diabetes mellitus without complications: Secondary | ICD-10-CM | POA: Insufficient documentation

## 2020-04-07 DIAGNOSIS — Z3A37 37 weeks gestation of pregnancy: Secondary | ICD-10-CM

## 2020-04-07 DIAGNOSIS — O2412 Pre-existing diabetes mellitus, type 2, in childbirth: Secondary | ICD-10-CM

## 2020-04-07 DIAGNOSIS — E099 Drug or chemical induced diabetes mellitus without complications: Secondary | ICD-10-CM | POA: Insufficient documentation

## 2020-04-07 DIAGNOSIS — O134 Gestational [pregnancy-induced] hypertension without significant proteinuria, complicating childbirth: Secondary | ICD-10-CM

## 2020-04-07 DIAGNOSIS — E282 Polycystic ovarian syndrome: Secondary | ICD-10-CM | POA: Insufficient documentation

## 2020-04-07 LAB — CBC
HCT: 39.3 % (ref 36.0–46.0)
Hemoglobin: 12.7 g/dL (ref 12.0–15.0)
MCH: 25.6 pg — ABNORMAL LOW (ref 26.0–34.0)
MCHC: 32.3 g/dL (ref 30.0–36.0)
MCV: 79.2 fL — ABNORMAL LOW (ref 80.0–100.0)
Platelets: 253 10*3/uL (ref 150–400)
RBC: 4.96 MIL/uL (ref 3.87–5.11)
RDW: 14.3 % (ref 11.5–15.5)
WBC: 16.7 10*3/uL — ABNORMAL HIGH (ref 4.0–10.5)
nRBC: 0 % (ref 0.0–0.2)

## 2020-04-07 LAB — COMPREHENSIVE METABOLIC PANEL
ALT: 40 U/L (ref 0–44)
AST: 45 U/L — ABNORMAL HIGH (ref 15–41)
Albumin: 2.6 g/dL — ABNORMAL LOW (ref 3.5–5.0)
Alkaline Phosphatase: 175 U/L — ABNORMAL HIGH (ref 38–126)
Anion gap: 12 (ref 5–15)
BUN: 10 mg/dL (ref 6–20)
CO2: 21 mmol/L — ABNORMAL LOW (ref 22–32)
Calcium: 8.1 mg/dL — ABNORMAL LOW (ref 8.9–10.3)
Chloride: 103 mmol/L (ref 98–111)
Creatinine, Ser: 1.19 mg/dL — ABNORMAL HIGH (ref 0.44–1.00)
GFR, Estimated: >60 mL/min (ref >60)
Glucose, Bld: 93 mg/dL (ref 70–99)
Potassium: 3.9 mmol/L (ref 3.5–5.1)
Sodium: 136 mmol/L (ref 135–145)
Total Bilirubin: 0.1 mg/dL — ABNORMAL LOW (ref 0.3–1.2)
Total Protein: 6.6 g/dL (ref 6.5–8.1)

## 2020-04-07 LAB — RPR: RPR Ser Ql: NONREACTIVE

## 2020-04-07 LAB — MAGNESIUM: Magnesium: 6.9 mg/dL (ref 1.7–2.4)

## 2020-04-07 LAB — GLUCOSE, CAPILLARY
Glucose-Capillary: 95 mg/dL (ref 70–99)
Glucose-Capillary: 103 mg/dL — ABNORMAL HIGH (ref 70–99)

## 2020-04-07 LAB — BRAIN NATRIURETIC PEPTIDE: B Natriuretic Peptide: 42.1 pg/mL (ref 0.0–100.0)

## 2020-04-07 MED ORDER — MAGNESIUM SULFATE 2 GM/50ML IV SOLN: 2.0000 g | Freq: Once | INTRAVENOUS | Status: DC

## 2020-04-07 MED ORDER — IBUPROFEN 600 MG PO TABS
600.0000 mg | ORAL_TABLET | Freq: Four times a day (QID) | ORAL | Status: DC
  Administered 2020-04-07: 05:00:00 600 mg via ORAL
  Filled 2020-04-07: qty 1

## 2020-04-07 MED ORDER — LACTATED RINGERS IV SOLN: INTRAVENOUS | Status: DC

## 2020-04-07 MED ORDER — COCONUT OIL OIL: 1.0000 "application " | TOPICAL_OIL | Status: DC | PRN

## 2020-04-07 MED ORDER — MAGNESIUM SULFATE 40 GM/1000ML IV SOLN
2.0000 g/h | INTRAVENOUS | Status: DC
  Administered 2020-04-08: 2 g/h via INTRAVENOUS

## 2020-04-07 MED ORDER — SIMETHICONE 80 MG PO CHEW: 80.0000 mg | CHEWABLE_TABLET | ORAL | Status: DC | PRN

## 2020-04-07 MED ORDER — MAGNESIUM SULFATE 40 GM/1000ML IV SOLN: 2.0000 g/h | INTRAVENOUS | Status: DC

## 2020-04-07 MED ORDER — MAGNESIUM SULFATE 40 GM/1000ML IV SOLN
2.0000 g/h | INTRAVENOUS | Status: DC
  Administered 2020-04-07: 02:00:00 2 g/h via INTRAVENOUS
  Filled 2020-04-07 (×2): qty 1000

## 2020-04-07 MED ORDER — IBUPROFEN 800 MG PO TABS
800.0000 mg | ORAL_TABLET | Freq: Three times a day (TID) | ORAL | Status: DC
  Administered 2020-04-07: 12:00:00 800 mg via ORAL
  Filled 2020-04-07: qty 1

## 2020-04-07 MED ORDER — SENNOSIDES-DOCUSATE SODIUM 8.6-50 MG PO TABS
2.0000 | ORAL_TABLET | ORAL | Status: DC
  Administered 2020-04-07 – 2020-04-08 (×2): 2 via ORAL
  Filled 2020-04-07 (×2): qty 2

## 2020-04-07 MED ORDER — ACETAMINOPHEN 325 MG PO TABS
650.0000 mg | ORAL_TABLET | ORAL | Status: DC | PRN
  Administered 2020-04-07 – 2020-04-08 (×2): 650 mg via ORAL
  Filled 2020-04-07 (×2): qty 2

## 2020-04-07 MED ORDER — MAGNESIUM SULFATE BOLUS VIA INFUSION
4.0000 g | Freq: Once | INTRAVENOUS | Status: AC
  Administered 2020-04-07: 02:00:00 4 g via INTRAVENOUS
  Filled 2020-04-07: qty 1000

## 2020-04-07 MED ORDER — TETANUS-DIPHTH-ACELL PERTUSSIS 5-2.5-18.5 LF-MCG/0.5 IM SUSY: 0.5000 mL | PREFILLED_SYRINGE | Freq: Once | INTRAMUSCULAR | Status: DC

## 2020-04-07 MED ORDER — DIPHENHYDRAMINE HCL 25 MG PO CAPS: 25.0000 mg | ORAL_CAPSULE | Freq: Four times a day (QID) | ORAL | Status: DC | PRN

## 2020-04-07 MED ORDER — IBUPROFEN 800 MG PO TABS
800.0000 mg | ORAL_TABLET | Freq: Three times a day (TID) | ORAL | Status: DC
  Administered 2020-04-07 – 2020-04-08 (×3): 800 mg via ORAL
  Filled 2020-04-07 (×3): qty 1

## 2020-04-07 MED ORDER — PRENATAL MULTIVITAMIN CH
1.0000 | ORAL_TABLET | Freq: Every day | ORAL | Status: DC
  Administered 2020-04-07 – 2020-04-08 (×2): 1 via ORAL
  Filled 2020-04-07 (×2): qty 1

## 2020-04-07 MED ORDER — WITCH HAZEL-GLYCERIN EX PADS: 1.0000 "application " | MEDICATED_PAD | CUTANEOUS | Status: DC | PRN

## 2020-04-07 MED ORDER — BENZOCAINE-MENTHOL 20-0.5 % EX AERO
1.0000 "application " | INHALATION_SPRAY | CUTANEOUS | Status: DC | PRN
  Administered 2020-04-07 – 2020-04-08 (×3): 1 via TOPICAL
  Filled 2020-04-07 (×3): qty 56

## 2020-04-07 MED ORDER — ONDANSETRON HCL 4 MG PO TABS: 4.0000 mg | ORAL_TABLET | ORAL | Status: DC | PRN

## 2020-04-07 MED ORDER — DIBUCAINE (PERIANAL) 1 % EX OINT: 1.0000 "application " | TOPICAL_OINTMENT | CUTANEOUS | Status: DC | PRN

## 2020-04-07 MED ORDER — SENNOSIDES-DOCUSATE SODIUM 8.6-50 MG PO TABS: 2.0000 | ORAL_TABLET | ORAL | Status: DC

## 2020-04-07 MED ORDER — ONDANSETRON HCL 4 MG/2ML IJ SOLN: 4.0000 mg | INTRAMUSCULAR | Status: DC | PRN

## 2020-04-07 MED ORDER — ZOLPIDEM TARTRATE 5 MG PO TABS: 5.0000 mg | ORAL_TABLET | Freq: Every evening | ORAL | Status: DC | PRN

## 2020-04-07 NOTE — Progress Notes (Signed)
Patient ID: Roberta Lutz, female   DOB: Oct 07, 1995, 24 y.o.   MRN: [id number] Epidural rebolused Vitals:   04/06/20 2346 04/06/20 2351 04/06/20 2356 04/07/20 0001  BP: 127/72 121/72 127/77 111/89  Pulse: 73 85 99 (!) 110  Resp:      Temp:      TempSrc:      SpO2:      Weight:      Height:       FHR not tracing well  Same with toco  IUPC inserted Cervix found to be completely dilated  Will prepare for delivery

## 2020-04-07 NOTE — Anesthesia Postprocedure Evaluation (Signed)
Anesthesia Post Note  Patient: Roberta Lutz  Procedure(s) Performed: AN AD HOC LABOR EPIDURAL     Patient location during evaluation: OB High Risk Anesthesia Type: Epidural Level of consciousness: awake and alert Pain management: pain level controlled Vital Signs Assessment: post-procedure vital signs reviewed and stable Respiratory status: spontaneous breathing Cardiovascular status: stable Postop Assessment: no headache, adequate PO intake, no backache, patient able to bend at knees, able to ambulate, epidural receding and no apparent nausea or vomiting Anesthetic complications: no   No complications documented.  Last Vitals:  Vitals:   04/07/20 0704 04/07/20 0802  BP: 126/79 (!) 90/46  Pulse: 86 86  Resp: 18 16  Temp:  36.4 C  SpO2:      Last Pain:  Vitals:   04/07/20 0802  TempSrc: Oral  PainSc:    Pain Goal: Patients Stated Pain Goal: 0 (04/07/20 0307)                 Salome Arnt

## 2020-04-07 NOTE — Lactation Note (Signed)
This note was copied from a baby's chart. Lactation Consultation Note  Patient Name: Roberta Lutz JJHER'D Date: 04/07/2020  P1, ETI infant, IUGR less than one hour old. Per RN, infant having  low O2's with retractions, currently being monitored , now is not a good time for [facility] services. Mom is doing STS with infant. ( No charge for [facility] services in L&D).    Maternal Data    Feeding    LATCH Score                   Interventions    Lactation Tools Discussed/Used     Consult Status      Danelle Earthly 04/07/2020, 1:25 AM

## 2020-04-07 NOTE — Progress Notes (Signed)
Patient ID: Roberta Lutz, female   DOB: [birth year], 24 y.o.   MRN: [id number] Still hurting on right sided despite epidural  AVSS  FHR reassuring UCs not tracing well  Cervix 6cm Slow SROM clear fluid   Repositioned right side for epidural flow  Will monitor for improved analgesia, consult Anesthesia MD prn

## 2020-04-07 NOTE — Progress Notes (Signed)
Post Partum Day 0 IOL for IUGR and SPEC Subjective: Pt feels tired this morning. Denies HA or visual changes. Tolerating diet. Pain controlled.  Objective: Blood pressure (!) 90/46, pulse 86, temperature 97.6 F (36.4 C), temperature source Oral, resp. rate 16, height 5\' 3"  (1.6 m), weight 99.8 kg, last menstrual period 07/22/2019, SpO2 100 %, unknown if currently breastfeeding.  Physical Exam:  General: alert Lochia: appropriate Uterine Fundus: firm Incision: healing well DVT Evaluation: No evidence of DVT seen on physical exam.  Recent Labs    04/06/20 2046 04/07/20 0314  HGB 13.1 12.7  HCT 40.6 39.3    Assessment/Plan: Stable. Magnesium x 24 hours. BP stable without meds.  Continue present management  Attending Circumcision Counseling Progress Note  Patient desires circumcision for her female infant.  Circumcision procedure details discussed, risks and benefits of procedure were also discussed.  These include but are not limited to: Benefits of circumcision in men include reduction in the rates of urinary tract infection (UTI), penile cancer, some sexually transmitted infections, penile inflammatory and retractile disorders, as well as easier hygiene.  Risks include bleeding , infection, injury of glans which may lead to penile deformity or urinary tract issues, unsatisfactory cosmetic appearance and other potential complications related to the procedure.  It was emphasized that this is an elective procedure.  Patient wants to proceed with circumcision; written informed consent obtained.  Will do circumcision soon, routine circumcision and post circumcision care ordered for the infant.  Clydie Dillen L. 04/09/20, M.D. 04/07/2020 11:02 AM      LOS: 1 day   04/09/2020 04/07/2020, 10:57 AM

## 2020-04-07 NOTE — Discharge Summary (Signed)
Postpartum Discharge Summary  Date of Service updated 04/08/20     Patient Name: Roberta Lutz DOB: 1995/05/31 MRN: [id number]  Date of admission: 04/06/2020 Delivery date:04/07/2020  Delivering provider: Seabron Spates  Date of discharge: 04/08/2020  Admitting diagnosis: Gestational hypertension [O13.9] Intrauterine pregnancy: [redacted]w[redacted]d    Secondary diagnosis:  Active Problems:   Gestational hypertension   Preeclampsia, severe, third trimester   Postpartum care following vaginal delivery  Additional problems: IUGR, Oligohydramnios    Discharge diagnosis: Term Pregnancy Delivered and Preeclampsia (severe)                                              Post partum procedures:postpartum magnesium sulfate Augmentation: Pitocin, Cytotec and IP Foley Complications: None  Hospital course: Induction of Labor With Vaginal Delivery   24y.o. yo G1P0 at 328w1das admitted to the hospital 04/06/2020 for induction of labor.  Indication for induction: Gestational hypertension and severe IUGR/oligo. Patient had an uncomplicated labor course as follows: Membrane Rupture Time/Date: 11:02 PM ,04/06/2020   Delivery Method:Vaginal, Spontaneous  Episiotomy: None  Lacerations:  1st degree;Vaginal  Details of delivery can be found in separate delivery note.  Patient had a routine postpartum course. Patient is discharged home 04/08/20.  Newborn Data: Birth date:04/07/2020  Birth time:12:41 AM  Gender:Female  Living status:Living  Apgars:7 ,8  Weight:2055 g   Magnesium Sulfate received: Yes: Seizure prophylaxis, postpartum magnesium sulfate recovery BMZ received: No Rhophylac:N/A MMR:No T-DaP:no Flu: No Transfusion:No  Physical exam  Vitals:   04/08/20 1020 04/08/20 1021 04/08/20 1306 04/08/20 1307  BP:  123/71  117/68  Pulse:  79  85  Resp:  19  17  Temp:  98.1 F (36.7 C) 98.1 F (36.7 C)   TempSrc:  Oral Oral   SpO2: 99%  100%   Weight:      Height:       General: alert,  cooperative and no distress Lochia: appropriate Uterine Fundus: firm Incision: N/A DVT Evaluation: No evidence of DVT seen on physical exam. Negative Homan's sign. Labs: Lab Results  Component Value Date   WBC 16.7 (H) 04/07/2020   HGB 12.7 04/07/2020   HCT 39.3 04/07/2020   MCV 79.2 (L) 04/07/2020   PLT 253 04/07/2020   CMP Latest Ref Rng & Units 04/07/2020  Glucose 70 - 99 mg/dL 93  BUN 6 - 20 mg/dL 10  Creatinine 0.44 - 1.00 mg/dL 1.19(H)  Sodium 135 - 145 mmol/L 136  Potassium 3.5 - 5.1 mmol/L 3.9  Chloride 98 - 111 mmol/L 103  CO2 22 - 32 mmol/L 21(L)  Calcium 8.9 - 10.3 mg/dL 8.1(L)  Total Protein 6.5 - 8.1 g/dL 6.6  Total Bilirubin 0.3 - 1.2 mg/dL 0.1(L)  Alkaline Phos 38 - 126 U/L 175(H)  AST 15 - 41 U/L 45(H)  ALT 0 - 44 U/L 40   Edinburgh Score: Edinburgh Postnatal Depression Scale Screening Tool 04/08/2020  I have been able to laugh and see the funny side of things. 0  I have looked forward with enjoyment to things. 0  I have blamed myself unnecessarily when things went wrong. 1  I have been anxious or worried for no good reason. 2  I have felt scared or panicky for no good reason. 1  Things have been getting on top of me. 1  I have been so unhappy  that I have had difficulty sleeping. 0  I have felt sad or miserable. 0  I have been so unhappy that I have been crying. 0  The thought of harming myself has occurred to me. 0  Edinburgh Postnatal Depression Scale Total 5      After visit meds:  Allergies as of 04/08/2020   No Known Allergies     Medication List    STOP taking these medications   Doxylamine-Pyridoxine 10-10 MG Tbec Commonly known as: Diclegis   NIFEdipine 10 MG capsule Commonly known as: Procardia   zolpidem 5 MG tablet Commonly known as: AMBIEN     TAKE these medications   ibuprofen 800 MG tablet Commonly known as: ADVIL Take 1 tablet (800 mg total) by mouth 3 (three) times daily.   prenatal multivitamin Tabs tablet Take 1  tablet by mouth daily at 12 noon.        Discharge home in stable condition Infant Feeding: Breast Infant Disposition:NICU Discharge instruction: per After Visit Summary and Postpartum booklet. Activity: Advance as tolerated. Pelvic rest for 6 weeks.  Diet: carb modified diet Anticipated Birth Control: Unsure Postpartum Appointment:1 week Additional Postpartum F/U: BP check 1 week Future Appointments: No future appointments. Follow up Visit:  Mabel. Schedule an appointment as soon as possible for a visit in 1 week(s).   Specialty: Obstetrics and Gynecology Why: blood pressure check, postpartum in 4 weeks Contact information: [address], [place] [address] [place] [phone]                  04/08/2020 Griffin Basil, MD

## 2020-04-07 NOTE — Lactation Note (Signed)
This note was copied from a baby's chart. Lactation Consultation Note  Patient Name: Roberta Lutz DPOEU'M Date: 04/07/2020 Reason for consult: Initial assessment;1st time breastfeeding;Early term 37-38.6wks;Infant < 6lbs;Primapara Type of Endocrine Disorder?: PCOS (Type 2 DM)   P1, Baby 8 hours old.  < 5 lbs STS on father's chest. Baby has had difficulty sustaining latch and per Roberta Fanning RN difficulty with slow flow bottle also. Infant took 3 ml of formula.   Setup DEBP, reviewed LPI feeding plan.   Mom made aware of O/P services, breastfeeding support groups, community resources, and our phone # for post-discharge questions.  Faxed WIC pump referral.       Maternal Data    Feeding Feeding Type: Bottle Fed - Formula Nipple Type: Slow - flow  LATCH Score                   Interventions Interventions: Breast feeding basics reviewed;Hand express;DEBP  Lactation Tools Discussed/Used Pump Review: Setup, frequency, and cleaning;Milk Storage Initiated by:: Roberta Byes RN Date initiated:: 04/07/20   Consult Status Consult Status: Follow-up Date: 04/08/20 Follow-up type: In-patient    Roberta Lutz [facility] 04/07/2020, 10:16 AM

## 2020-04-08 MED ORDER — IBUPROFEN 800 MG PO TABS: 800.0000 mg | ORAL_TABLET | Freq: Three times a day (TID) | ORAL | 2 refills | Status: DC

## 2020-04-08 MED ORDER — HYDROXYZINE HCL 50 MG PO TABS: 25.0000 mg | ORAL_TABLET | Freq: Three times a day (TID) | ORAL | Status: DC | PRN

## 2020-04-08 NOTE — Lactation Note (Signed)
This note was copied from a baby's chart. Lactation Consultation Note  Patient Name: Roberta Lutz CSPZZ'C Date: 04/08/2020 Reason for consult: Follow-up assessment;NICU baby Age:24 years  LC to mother's room for f/u visit. Mom to d/c today; infant to remain in NICU. Mother is no longer pumping and plans to bottle feed. Encouraged mother to take pump kit home with her for use prn.   Consult Status Consult Status: Complete Follow-up type: [place], MA IBCLC 04/08/2020, 4:35 PM

## 2020-04-08 NOTE — Progress Notes (Signed)
RN in room to assess pt as she reports blurred vision and feeling light headed. Dr. Mayford Knife notified and instructed RN to discontinue 2g/hr Magnesium Sulfate. Magnesium level ordered.   Results for VERLEE, POPE (MRN [id number]) as of 04/08/2020 02:57  Ref. Range 04/07/2020 20:45  Magnesium Latest Ref Range: 1.7 - 2.4 mg/dL 6.9 [facility])   After labs were reviewed by Dr. Mayford Knife. RN was instructed to restart 2g/hr Magnesium Sulfate and end time would remain 0230 12/24.

## 2020-04-08 NOTE — Clinical Social Work Maternal (Signed)
CLINICAL SOCIAL WORK MATERNAL/CHILD NOTE  Patient Details  Name: Roberta Lutz MRN: [id number] Date of Birth: 05-01-1995  Date:  04/08/2020  Clinical Social Worker Initiating Note:  Roberta Lutz Date/Time: Initiated:  04/08/20/1024     Child's Name:  Roberta Lutz   Biological Parents:  Mother,Father   Need for Interpreter:  None   Reason for Referral:  Behavioral Health Concerns (hx of anxiety)   Address:  [address] [place] [address]    Phone number:  [phone] (home)     Additional phone number: FOB's number is [phone]  Household Members/Support Persons (HM/SP):   Household Member/Support Person 1   HM/SP Name Relationship DOB or Age  HM/SP -1 Roberta Lutz FOB 07/17/1993  HM/SP -2        HM/SP -3        HM/SP -4        HM/SP -5        HM/SP -6        HM/SP -7        HM/SP -8          Natural Supports (not living in the home):  Immediate Family,Extended Family (Per [name], FOB's family will also provide supports if needed.)   Professional Supports:     Employment: Environmental education officer   Type of Work: [employer]   Education:  Research scientist (physical sciences)   Homebound arranged:    Surveyor, quantity Resources:  [employer]   Other Resources:  WIC (CSW provided [name] with information to apply for [employer].)   Cultural/Religious Considerations Which May Impact Care:  None reported  Strengths:  Ability to meet basic needs ,Home prepared for child  (Peds list was provided to [name].)   Psychotropic Medications:         Pediatrician:       Pediatrician List:   [employer] [place]    [place]      Pediatrician Fax Number:    Risk Factors/Current Problems:  Mental Health Concerns    Cognitive State:  Able to Concentrate ,Alert ,Insightful ,Goal Oriented ,Linear Thinking    Mood/Affect:  Interested ,Comfortable ,Happy ,Relaxed    CSW  Assessment: CSW meet with [name] in room 117 to complete an assessment for mental health hx.  When CSW arrived, [name] was resting in the recliner watching TV.  [name] appeared flat however she was polite, easy to engage and receptive to meeting with CSW.    CSW inquired about [name]'s MH and [name] acknowledged a hx of anxiety symptoms for the past 2 years however [name] stated, "I have never been officially dx. [name] also shared that during L&D [name] felt overly anxious and panicky. Per [name], [name] is not currently taking any medications and is open to starting medication while inpatient; CSW agreed to communicate patient's desire to [name]'s bedside nurse (CSW spoke with RN Roberta Lutz). CSW educated [name] about PMADs. CSW informed [name] of possible supports and interventions to decrease PPD.  CSW also encouraged [name] to seek medical attention if needed for increased signs and symptoms of PPD.  CSW also offered [name] resources for outpatient behavioral health services and [name] declined. CSW encouraged [name] to evaluate her mental health throughout the postpartum period with the use of the New Mom Checklist developed by Postpartum Progress and notify a medical professional if symptoms arise; [name] agreed. [name] presented with insight and awareness and denied SI,  HI, and DV when assessed for safety. [name] reported having a good support team that will be willing to help if needed. CSW reviewed safe sleep and SIDS. [name] communicated that [name] has everything she needs for the baby and is prepared to meet her infant's needs.  [name] did not have any further questions, concerns, or needs currently. CSW thanked [name] for allowing CSW to meet with her. CSW will continue to offer resources and supports to family while infant remains in NICU.  There are no barriers to discharge.  CSW Plan/Description:  Psychosocial Support and Ongoing Assessment of Needs,Sudden Infant Death Syndrome (SIDS) Education,Perinatal Mood and Anxiety Disorder (PMADs) Education,Other Patient/Family  Education,Other Information/Referral to [employer], MSW, [employer] Social Work [phone]   Roberta Cower, LCSW 04/08/2020, 10:27 AM

## 2020-04-08 NOTE — Discharge Instructions (Signed)

## 2020-04-08 NOTE — Progress Notes (Signed)
Reviewed discharge instructions with patient regarding medications, when to call MD/go to MAU, signs and symptoms of pre-e, postpartum course, to call and schedule follow up appointment in 1 week for BP check and in 4 weeks for postpartum check. Patient verbalized understanding of discharge postpartum instructions and asked appropriate questions. Patient left ambulatory with significant other.

## 2020-04-11 LAB — SURGICAL PATHOLOGY

## 2020-04-13 ENCOUNTER — Ambulatory Visit: Payer: Managed Care, Other (non HMO)

## 2020-04-14 ENCOUNTER — Ambulatory Visit: Payer: Managed Care, Other (non HMO)

## 2020-04-14 ENCOUNTER — Encounter: Payer: Managed Care, Other (non HMO) | Admitting: Obstetrics & Gynecology

## 2020-04-16 DIAGNOSIS — Z419 Encounter for procedure for purposes other than remedying health state, unspecified: Secondary | ICD-10-CM | POA: Diagnosis not present

## 2020-04-21 ENCOUNTER — Ambulatory Visit: Payer: 59

## 2020-04-21 ENCOUNTER — Other Ambulatory Visit: Payer: Self-pay

## 2020-04-21 ENCOUNTER — Encounter: Payer: Managed Care, Other (non HMO) | Admitting: Obstetrics and Gynecology

## 2020-04-21 VITALS — BP 119/83 | HR 69 | Wt 207.0 lb

## 2020-04-21 DIAGNOSIS — O139 Gestational [pregnancy-induced] hypertension without significant proteinuria, unspecified trimester: Secondary | ICD-10-CM

## 2020-04-21 NOTE — Progress Notes (Signed)
PP pt presents for B/P Check   SVD 04/07/20.  Pt denies any HA's or visual changes no swelling.   Consulted with provider on pt B/P B/P WNL Pt advised to schedule / keep PP appt. Precautions discussed Pt voiced understanding.

## 2020-04-21 NOTE — Progress Notes (Signed)
Patient was assessed and managed by nursing staff during this encounter. I have reviewed the chart and agree with the documentation and plan. I have also made any necessary editorial changes.  Braniya Farrugia, MD 04/21/2020 3:12 PM    

## 2020-04-28 ENCOUNTER — Encounter: Payer: Managed Care, Other (non HMO) | Admitting: Obstetrics and Gynecology

## 2020-05-05 ENCOUNTER — Ambulatory Visit (INDEPENDENT_AMBULATORY_CARE_PROVIDER_SITE_OTHER): Payer: 59

## 2020-05-05 ENCOUNTER — Other Ambulatory Visit: Payer: Self-pay

## 2020-05-05 DIAGNOSIS — F419 Anxiety disorder, unspecified: Secondary | ICD-10-CM

## 2020-05-05 DIAGNOSIS — M5431 Sciatica, right side: Secondary | ICD-10-CM

## 2020-05-05 NOTE — Patient Instructions (Signed)

## 2020-05-05 NOTE — Progress Notes (Signed)
Post Partum Visit Note  Roberta Lutz is a 25 y.o. G29P1001 female who presents for a postpartum visit. She is 4 weeks postpartum following a normal spontaneous vaginal delivery.  I have fully reviewed the prenatal and intrapartum course. The delivery was at 37.1 gestational weeks.  Anesthesia: epidural. Postpartum course has been unremarkable. Baby is doing well. Baby is feeding by bottle - Similac Neosure. Bleeding staining only. Bowel function is normal. Bladder function is normal. Patient is not sexually active. Contraception method is none. Postpartum depression screening: negative=0.  She reports some right leg pain that feels like a numbing/tingling sensation and causes some limitations in movement.  She states it has happened on two occassions.  She reports the pain resolves spontaneously.  She also reports some pelvic pain with sneezing, but states it resolves after about 2 minutes.  She reports getting help from her boyfriend and grandmother.  However, she states she is unable to get a least 4 hours of sleep per night.  She declines sleep aide.   The pregnancy intention screening data noted above was reviewed. Potential methods of contraception were discussed. The patient elected to proceed with No Method - No Contraceptive Precautions.    Edinburgh Postnatal Depression Scale - 05/05/20 1328      Edinburgh Postnatal Depression Scale:  In the Past 7 Days   I have been able to laugh and see the funny side of things. 0    I have looked forward with enjoyment to things. 0    I have blamed myself unnecessarily when things went wrong. 0    I have been anxious or worried for no good reason. 0    I have felt scared or panicky for no good reason. 0    Things have been getting on top of me. 0    I have been so unhappy that I have had difficulty sleeping. 0    I have felt sad or miserable. 0    I have been so unhappy that I have been crying. 0    The thought of harming myself has occurred  to me. 0    Edinburgh Postnatal Depression Scale Total 0            The following portions of the patient's history were reviewed and updated as appropriate: allergies, current medications, past family history, past medical history, past social history, past surgical history and problem list.  Review of Systems Pertinent items noted in HPI and remainder of comprehensive ROS otherwise negative.    Objective:  BP 117/82   Pulse (!) 59   Wt 212 lb (96.2 kg)   Breastfeeding No   BMI 37.55 kg/m    General:  alert, cooperative and no distress   Breasts:  Not Examined  Lungs: Normal breathing.  No distress noted.   Heart:  Regular rate  Abdomen: normal findings: soft, non-tender   Vulva:  not evaluated  Vagina: not evaluated  Cervix:  Not Evaluated  Corpus: not examined  Adnexa:  not evaluated  Rectal Exam: Not performed.        Assessment:   4 weeks postpartum exam Normal Involution Sciatica Pain Anxiety Pap Smear UTD  Plan:  Okay to return to normal activities including sex and work if desired. Discussed anxiety and need to obtain proper sleep to care for self and infant. Encouraged to allow others to support her in this journey. Reassured that it is normal to feel the need to do everything as a  1st time mother! Informed of availability of LCSW for assistance if needed. Discussed usage of sleep aides such as benadryl as needed.  Instructed to monitor symptoms r/t sciatica.  Patient to report worsening of symptoms.  Instructed to continue PNV as she can become pregnant at any moment without usage of birth control method.   Essential components of care per ACOG recommendations:  1.  Mood and well being: Patient with negative depression screening today. Reviewed local resources for support.  - Patient does not use tobacco.  - hx of drug use? No    2. Infant care and feeding:  -Patient currently breastmilk feeding? No -Social determinants of health (SDOH) reviewed in  EPIC. No concerns  3. Sexuality, contraception and birth spacing - Patient does not want a pregnancy in the next year.  Desired family size is 3 children.  - Reviewed forms of contraception in tiered fashion. Patient desired no method today.   - Discussed birth spacing of 18 months  4. Sleep and fatigue -Encouraged family/partner/community support of 4 hrs of uninterrupted sleep to help with mood and fatigue.  She reports she gets about 2 hours of sleep daily due to insomnia (going to sleep).   5. Physical Recovery  - Discussed patients delivery and complications.  She states "it was a lot." However, she thinks it was a good experience overall.  - Patient had a vaginal laceration, perineal healing reviewed. Patient expressed understanding - Patient has urinary incontinence? No  - Patient is safe to resume physical and sexual activity  6.  Health Maintenance - Last pap smear done July 2021 and was normal with negative HPV. No Mammogram  7. No Chronic Disease - PCP follow up  Cherre Robins MSN, CNM Advanced Practice Provider, Center for [facility]

## 2020-05-17 DIAGNOSIS — Z419 Encounter for procedure for purposes other than remedying health state, unspecified: Secondary | ICD-10-CM | POA: Diagnosis not present

## 2020-06-14 DIAGNOSIS — Z419 Encounter for procedure for purposes other than remedying health state, unspecified: Secondary | ICD-10-CM | POA: Diagnosis not present

## 2020-07-15 DIAGNOSIS — Z419 Encounter for procedure for purposes other than remedying health state, unspecified: Secondary | ICD-10-CM | POA: Diagnosis not present

## 2020-08-14 DIAGNOSIS — Z419 Encounter for procedure for purposes other than remedying health state, unspecified: Secondary | ICD-10-CM | POA: Diagnosis not present

## 2020-08-18 ENCOUNTER — Other Ambulatory Visit: Payer: Self-pay

## 2020-08-18 ENCOUNTER — Other Ambulatory Visit (HOSPITAL_COMMUNITY)
Admission: RE | Admit: 2020-08-18 | Discharge: 2020-08-18 | Disposition: A | Discharge status: Home / Self Care | Payer: Medicaid Other | Source: Ambulatory Visit | Attending: Nurse Practitioner | Admitting: Nurse Practitioner

## 2020-08-18 ENCOUNTER — Ambulatory Visit (INDEPENDENT_AMBULATORY_CARE_PROVIDER_SITE_OTHER): Payer: 59 | Admitting: Nurse Practitioner

## 2020-08-18 VITALS — BP 130/85 | HR 91 | Wt 227.0 lb

## 2020-08-18 DIAGNOSIS — N939 Abnormal uterine and vaginal bleeding, unspecified: Secondary | ICD-10-CM | POA: Diagnosis not present

## 2020-08-18 NOTE — Progress Notes (Signed)
Pt states had been regular since PP visit in Jan.  Pt states she is now having irregular, heavy bleeding with cramping.

## 2020-08-18 NOTE — Progress Notes (Signed)
GYNECOLOGY OFFICE VISIT NOTE   History:  25 y.o. G1P1001 here today for abnormal vaginal bleeding.  History of irregular cycles (infrequent) before pregnancy.  Beginning in January, she has had a monthly period.  Yesterday she began bleeding and it was 2 weeks before her period was expected.  Feels the bleeding pouring out.  Is worried about this bleeding and came to be checked today.  Is using her menstrual app to identify fertile times and is not having intercourse at those times. She denies any abnormal vaginal discharge, bleeding, pelvic pain or other concerns.   Past Medical History:  Diagnosis Date  . Anxiety   . Diabetes mellitus without complication (HCC)    Type II  . Gestational hypertension 04/06/2020  . Headache   . PCOS (polycystic ovarian syndrome)   . Pilonidal cyst   . Rosacea   . Type 2 diabetes mellitus during pregnancy, third trimester 11/24/2019   Note from 2017 Called and spoke with patient regarding blood sugar of 536. Advised her to be seen in ED for recheck and further management. She expressed understanding. I then spoke with Dr. Burna Forts who advised that given patient's relative lack of symptoms and no other abnormalities on CMP (no acidosis), it would be reasonable for her to follow up with PCP on Monday.      Past Surgical History:  Procedure Laterality Date  . DRUG INDUCED ENDOSCOPY     Gastritis    The following portions of the patient's history were reviewed and updated as appropriate: allergies, current medications, past family history, past medical history, past social history, past surgical history and problem list.   Health Maintenance:  Normal pap on 10-26-19.    Review of Systems:  Pertinent items noted in HPI and remainder of comprehensive ROS otherwise negative.  Objective:  Physical Exam BP 130/85   Pulse 91   Wt 227 lb (103 kg)   LMP 07/27/2020   BMI 40.21 kg/m  CONSTITUTIONAL: Well-developed, well-nourished female in no acute distress.   HENT:  Normocephalic, atraumatic. External right and left ear normal.  EYES: Conjunctivae and EOM are normal. Pupils are equal, round.  No scleral icterus.  NECK: Normal range of motion, supple, no masses SKIN: Skin is warm and dry. No rash noted. Not diaphoretic. No erythema. No pallor. NEUROLOGIC: Alert and oriented to person, place, and time. Normal muscle tone coordination. No cranial nerve deficit noted. PSYCHIATRIC: Normal mood and affect. Normal behavior. Normal judgment and thought content. CARDIOVASCULAR: Normal heart rate noted RESPIRATORY: Effort and breath sounds normal, no problems with respiration noted ABDOMEN: Soft, no distention noted.   PELVIC: Normal appearing external genitalia; normal appearing vaginal mucosa and cervix.  No abnormal discharge noted.  Normal uterine size, no other palpable masses, no uterine or adnexal tenderness.  Red luid blood seen in vault and coming from cervix in manner consistent with a usual menstrual cycle. MUSCULOSKELETAL: Normal range of motion. No edema noted.  Labs and Imaging No results found.  Assessment & Plan:  1. Abnormal vaginal bleeding Discussed possible management strategies Does not want hormonal birth control due to weight gain when she took pills before. Will check for infection as a cause for this bleeding - pending Advised to call the office if menses keeps being early on a consistent basis Reassured that visually and from her report this does sound like her period.  - Cervicovaginal ancillary only( [facility])   Routine preventative health maintenance measures emphasized. Please refer to After Visit Summary  for other counseling recommendations.   Return in about 9 months (around 05/17/2021) for annual exam.   Total face-to-face time with patient: 10 minutes.  Over 50% of encounter was spent on counseling and coordination of care.  Nolene Bernheim, RN, MSN, NP-BC Nurse Practitioner, [facility] for  [employer], [facility] 08/18/2020 2:22 PM

## 2020-08-19 LAB — CERVICOVAGINAL ANCILLARY ONLY
Bacterial Vaginitis (gardnerella): POSITIVE — AB
Candida Glabrata: NEGATIVE
Candida Vaginitis: NEGATIVE
Chlamydia: NEGATIVE
Comment: NEGATIVE
Comment: NEGATIVE
Comment: NEGATIVE
Comment: NEGATIVE
Comment: NEGATIVE
Comment: NORMAL
Neisseria Gonorrhea: NEGATIVE
Trichomonas: NEGATIVE

## 2020-09-14 DIAGNOSIS — Z419 Encounter for procedure for purposes other than remedying health state, unspecified: Secondary | ICD-10-CM | POA: Diagnosis not present

## 2020-10-14 DIAGNOSIS — Z419 Encounter for procedure for purposes other than remedying health state, unspecified: Secondary | ICD-10-CM | POA: Diagnosis not present

## 2020-11-14 DIAGNOSIS — Z419 Encounter for procedure for purposes other than remedying health state, unspecified: Secondary | ICD-10-CM | POA: Diagnosis not present

## 2020-12-15 DIAGNOSIS — Z419 Encounter for procedure for purposes other than remedying health state, unspecified: Secondary | ICD-10-CM | POA: Diagnosis not present

## 2021-01-14 DIAGNOSIS — Z419 Encounter for procedure for purposes other than remedying health state, unspecified: Secondary | ICD-10-CM | POA: Diagnosis not present

## 2021-02-14 DIAGNOSIS — Z419 Encounter for procedure for purposes other than remedying health state, unspecified: Secondary | ICD-10-CM | POA: Diagnosis not present

## 2021-03-16 DIAGNOSIS — Z419 Encounter for procedure for purposes other than remedying health state, unspecified: Secondary | ICD-10-CM | POA: Diagnosis not present

## 2021-03-24 ENCOUNTER — Other Ambulatory Visit: Payer: Self-pay

## 2021-03-24 ENCOUNTER — Emergency Department (HOSPITAL_COMMUNITY)
Admission: EM | Admit: 2021-03-24 | Discharge: 2021-03-25 | Disposition: A | Discharge status: Home / Self Care | Payer: Medicaid Other | Attending: Emergency Medicine | Admitting: Emergency Medicine

## 2021-03-24 ENCOUNTER — Encounter (HOSPITAL_COMMUNITY): Payer: Self-pay | Admitting: Emergency Medicine

## 2021-03-24 ENCOUNTER — Emergency Department (HOSPITAL_COMMUNITY): Payer: Medicaid Other

## 2021-03-24 DIAGNOSIS — R059 Cough, unspecified: Secondary | ICD-10-CM | POA: Insufficient documentation

## 2021-03-24 DIAGNOSIS — Z20822 Contact with and (suspected) exposure to covid-19: Secondary | ICD-10-CM | POA: Diagnosis not present

## 2021-03-24 DIAGNOSIS — R112 Nausea with vomiting, unspecified: Secondary | ICD-10-CM | POA: Insufficient documentation

## 2021-03-24 DIAGNOSIS — R0602 Shortness of breath: Secondary | ICD-10-CM | POA: Insufficient documentation

## 2021-03-24 DIAGNOSIS — Z5321 Procedure and treatment not carried out due to patient leaving prior to being seen by health care provider: Secondary | ICD-10-CM | POA: Diagnosis not present

## 2021-03-24 LAB — RESP PANEL BY RT-PCR (FLU A&B, COVID) ARPGX2
Influenza A by PCR: POSITIVE — AB
Influenza B by PCR: NEGATIVE
SARS Coronavirus 2 by RT PCR: NEGATIVE

## 2021-03-24 MED ORDER — ONDANSETRON 4 MG PO TBDP
4.0000 mg | ORAL_TABLET | Freq: Once | ORAL | Status: AC
  Administered 2021-03-24: 4 mg via ORAL
  Filled 2021-03-24: qty 1

## 2021-03-24 NOTE — ED Notes (Signed)
Pt called from waiting area twice , stating she is SOB. Triage RN is aware and checking on her

## 2021-03-24 NOTE — ED Triage Notes (Signed)
Pt here for URI symptoms w/ cough and shob x1 week. Pt reports N/V, and pain w/ breathing.

## 2021-03-24 NOTE — ED Notes (Signed)
Pt vomiting in triage, unable to obtain strep swab

## 2021-03-24 NOTE — ED Notes (Addendum)
Pt keeps Associate Professor we have checked her oxygen, pt oxygen has been 95 to 96%

## 2021-03-24 NOTE — ED Provider Notes (Signed)
Emergency Medicine Provider Triage Evaluation Note  Roberta Lutz , a 25 y.o. female  was evaluated in triage.  Pt complains of SOB, rhinorrhea, nasal congestion, cough, nausea, and vomiting over the past week. Denies any chest pain. Denies any abdominal pain.   Review of Systems  Positive: SOB, rhinorrhea, nasal congestion, cough, nausea, and vomiting  Negative: Abdominal pain or chest pain  Physical Exam  BP 130/85 (BP Location: Right Arm)   Pulse (!) 115   Temp (!) 101.7 F (38.7 C)   Resp (!) 22   SpO2 98%  Gen:   Awake, no distress   Resp:  Normal effort, bilateral wheezing, no resp distress MSK:   Moves extremities without difficulty  Other:    Medical Decision Making  Medically screening exam initiated at 4:13 PM.  Appropriate orders placed.  Roberta Lutz was informed that the remainder of the evaluation will be completed by another provider, this initial triage assessment does not replace that evaluation, and the importance of remaining in the ED until their evaluation is complete.  Labs and imaging ordered.    Achille Rich, PA-C 03/24/21 1615    Tegeler, Canary Brim, MD 03/24/21 [phone]

## 2021-03-25 NOTE — ED Notes (Signed)
Patient left on own accord °

## 2021-03-27 ENCOUNTER — Telehealth: Payer: Self-pay

## 2021-03-27 NOTE — Telephone Encounter (Signed)
Transition Care Management Unsuccessful Follow-up Telephone Call  Date of discharge and from where:  03/24/2021-[facility]  Attempts:  1st Attempt  Reason for unsuccessful TCM follow-up call:  Voice mail full

## 2021-03-28 NOTE — Telephone Encounter (Signed)
Transition Care Management Unsuccessful Follow-up Telephone Call  Date of discharge and from where:  03/24/2021 from [facility]  Attempts:  2nd Attempt  Reason for unsuccessful TCM follow-up call:  Voice mail full

## 2021-03-29 NOTE — Telephone Encounter (Signed)
Transition Care Management Unsuccessful Follow-up Telephone Call  Date of discharge and from where:  03/24/2021-[facility]  Attempts:  3rd Attempt  Reason for unsuccessful TCM follow-up call:  Voice mail full

## 2021-04-16 DIAGNOSIS — Z419 Encounter for procedure for purposes other than remedying health state, unspecified: Secondary | ICD-10-CM | POA: Diagnosis not present

## 2021-05-17 DIAGNOSIS — Z419 Encounter for procedure for purposes other than remedying health state, unspecified: Secondary | ICD-10-CM | POA: Diagnosis not present

## 2021-06-14 DIAGNOSIS — Z419 Encounter for procedure for purposes other than remedying health state, unspecified: Secondary | ICD-10-CM | POA: Diagnosis not present

## 2021-06-17 IMAGING — US US OB COMP LESS 14 WK
1 series · 15 of 28 positions shown · non-contrast
Comparison: Obstetric ultrasound 09/22/2019

CLINICAL DATA: Pregnant patient at 11 weeks pregnancy with left
adnexal tenderness.

EXAM:
OBSTETRIC <14 WK ULTRASOUND
TECHNIQUE: Transabdominal ultrasound was performed for evaluation of the
gestation as well as the maternal uterus and adnexal regions.

[Series 1: us ob comp less 14 wk · 15 of 34 slices shown]
[im 1/34]
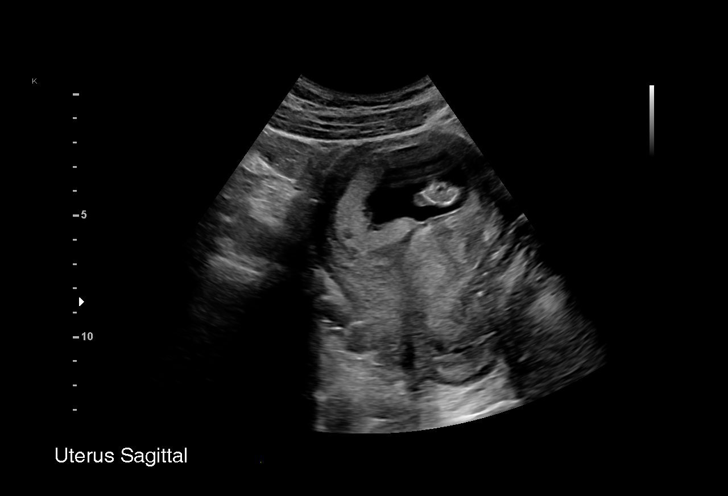
[im 3/34]
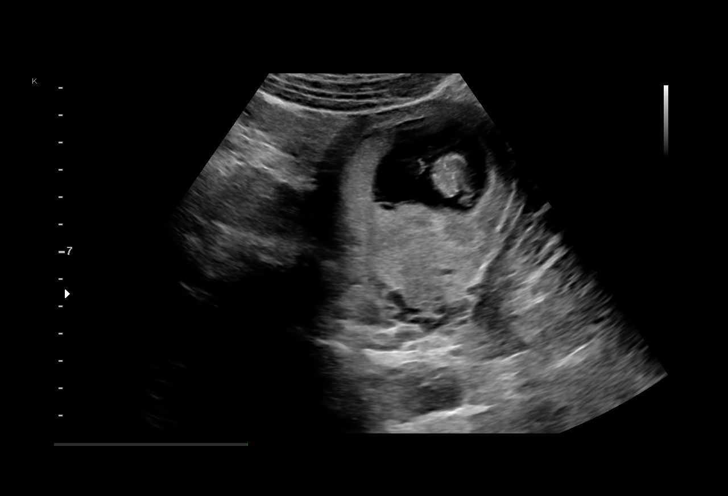
[im 5/34]
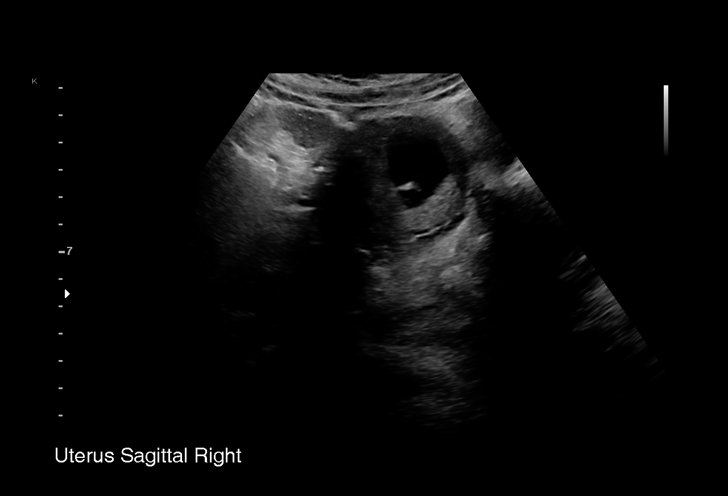
[im 8/34]
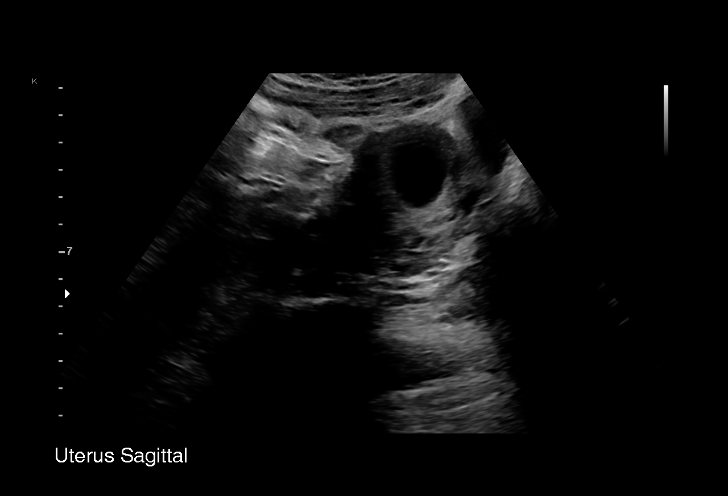
[im 10/34]
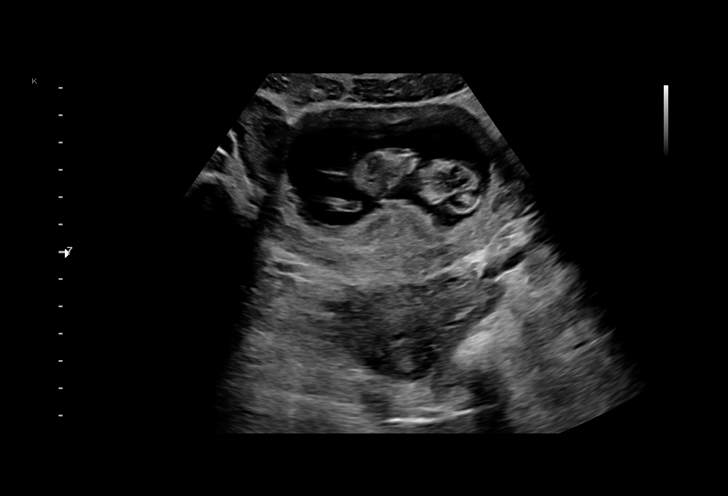
[im 13/34]
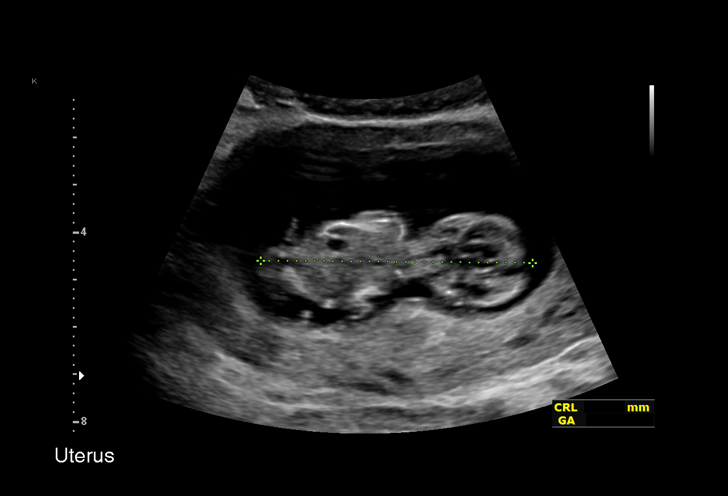
[im 15/34]
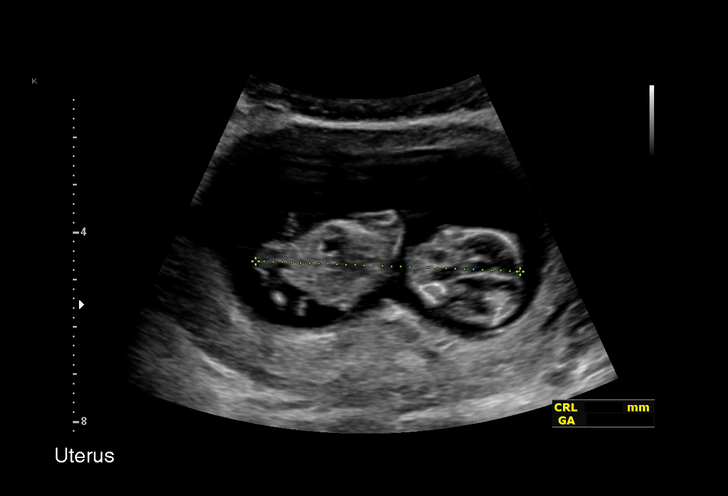
[im 18/34]
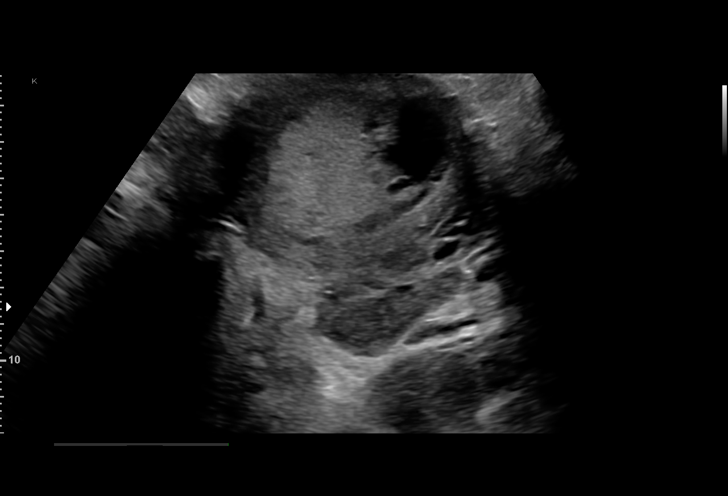
[im 19/34]
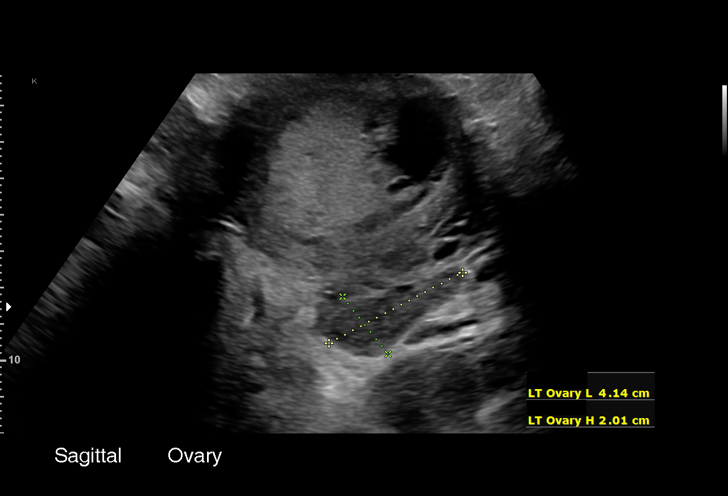
[im 21/34]
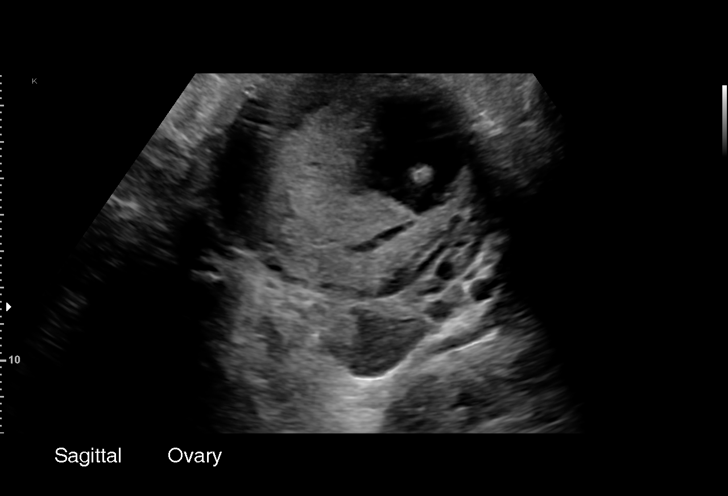
[im 24/34]
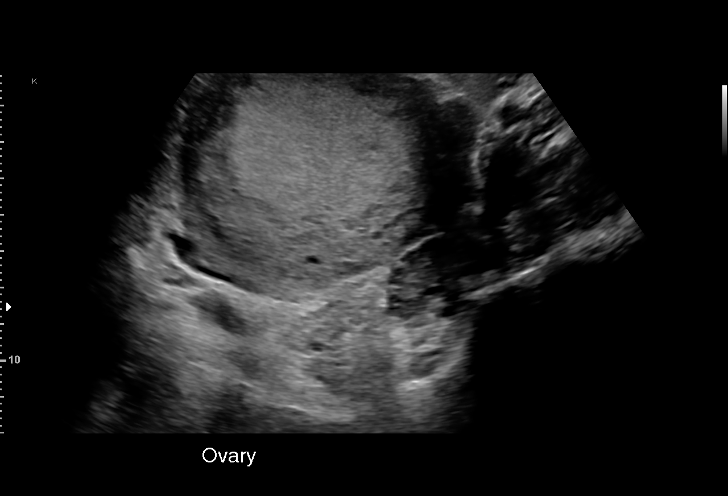
[im 26/34]
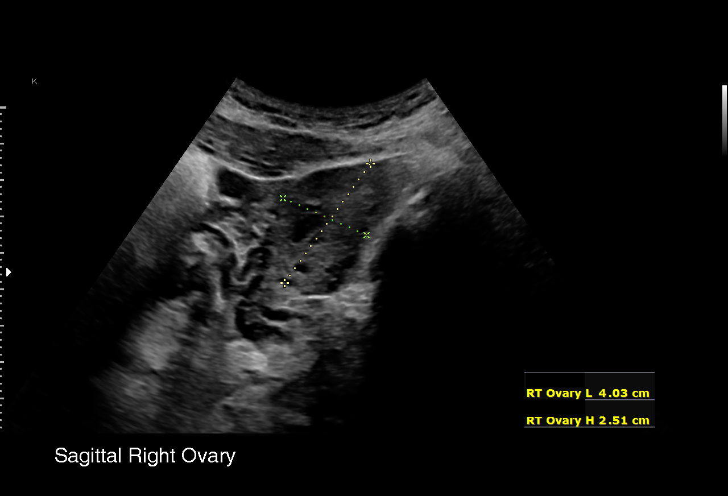
[im 29/34]
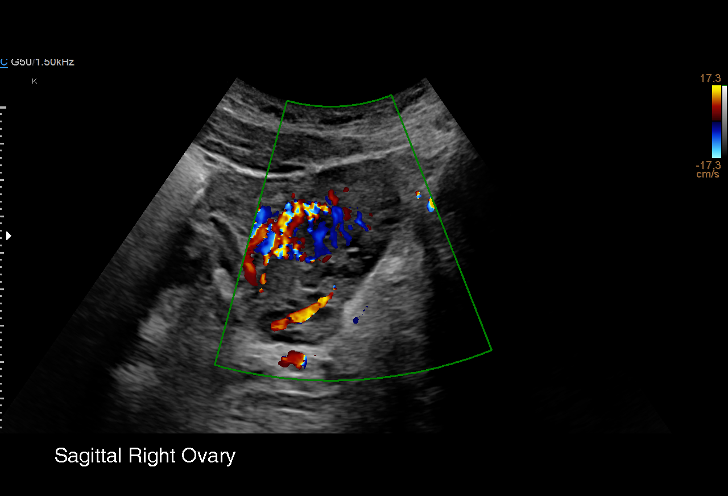
[im 31/34]
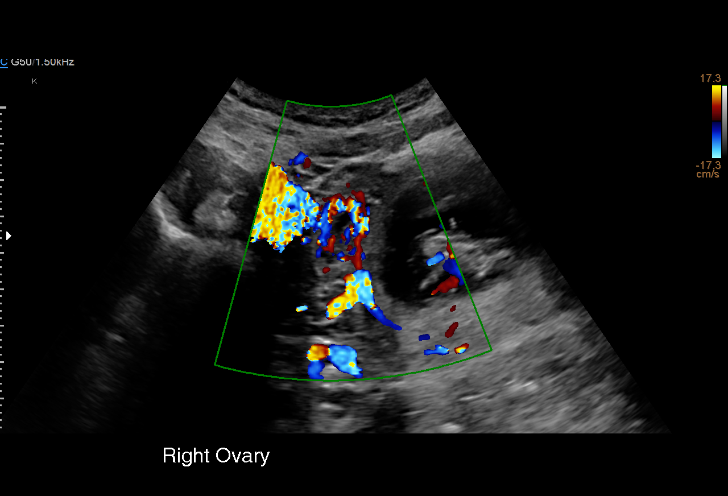
[im 34/34]
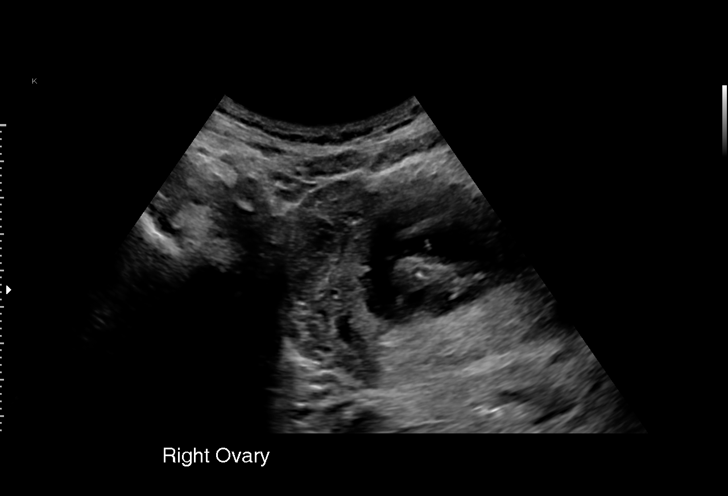

[15 of 28 positions shown; findings below may reference images not displayed]

FINDINGS: Intrauterine gestational sac: Single

Yolk sac:  Not Visualized.

Embryo:  Visualized.

Cardiac Activity: Visualized.

Heart Rate: 174 bpm

CRL:   56.2 mm   12 w 1 d                  US EDC: 04/24/2020

Subchorionic hemorrhage: Previous small subchorionic hemorrhage has
resolved.

Maternal uterus/adnexae: The left ovary is visualized and is normal
measuring 4.1 x 2.0 x 1.7 cm. The right ovary is visualized and is
normal measuring 4.0 x 2.5 x 1.9 cm. There is no adnexal mass or
pelvic free fluid.
IMPRESSION: 1. Single live intrauterine pregnancy estimated gestational age 12
weeks 1 day based on crown-rump length for ultrasound EDC
04/24/2020. There has been interval fetal growth since prior exam.
2. Previous subchorionic hemorrhage has resolved.
3. Normal sonographic appearance of both ovaries. No explanation for
adnexal tenderness.

## 2021-07-15 DIAGNOSIS — Z419 Encounter for procedure for purposes other than remedying health state, unspecified: Secondary | ICD-10-CM | POA: Diagnosis not present

## 2021-08-14 DIAGNOSIS — Z419 Encounter for procedure for purposes other than remedying health state, unspecified: Secondary | ICD-10-CM | POA: Diagnosis not present

## 2021-09-14 DIAGNOSIS — Z419 Encounter for procedure for purposes other than remedying health state, unspecified: Secondary | ICD-10-CM | POA: Diagnosis not present

## 2021-10-14 DIAGNOSIS — Z419 Encounter for procedure for purposes other than remedying health state, unspecified: Secondary | ICD-10-CM | POA: Diagnosis not present

## 2021-11-14 DIAGNOSIS — Z419 Encounter for procedure for purposes other than remedying health state, unspecified: Secondary | ICD-10-CM | POA: Diagnosis not present

## 2021-11-16 ENCOUNTER — Ambulatory Visit (INDEPENDENT_AMBULATORY_CARE_PROVIDER_SITE_OTHER): Payer: Medicaid Other | Admitting: Emergency Medicine

## 2021-11-16 ENCOUNTER — Ambulatory Visit: Payer: Medicaid Other

## 2021-11-16 VITALS — BP 127/84 | Ht 63.5 in | Wt 218.2 lb

## 2021-11-16 DIAGNOSIS — Z3201 Encounter for pregnancy test, result positive: Secondary | ICD-10-CM

## 2021-11-16 DIAGNOSIS — Z3200 Encounter for pregnancy test, result unknown: Secondary | ICD-10-CM

## 2021-11-16 LAB — POCT URINE PREGNANCY: Preg Test, Ur: POSITIVE — AB

## 2021-11-16 MED ORDER — PROMETHAZINE HCL 25 MG PO TABS: 25.0000 mg | ORAL_TABLET | Freq: Four times a day (QID) | ORAL | 1 refills | Status: DC | PRN

## 2021-11-16 NOTE — Progress Notes (Signed)
[name] presents today for UPT. She has no unusual complaints and complains of nausea with vomiting for 9 days. LMP: 09/24/2021    OBJECTIVE: Appears well, in no apparent distress.  OB History     Gravida  2   Para  1   Term  1   Preterm      AB      Living  1      SAB      IAB      Ectopic      Multiple  0   Live Births  1          Home UPT Result: positive In-Office UPT result:positive I have reviewed the patient's medical, obstetrical, social, and family histories, and medications.   ASSESSMENT: Positive pregnancy test  PLAN Prenatal care to be completed at: [facility]

## 2021-11-22 IMAGING — US US MFM OB FOLLOW-UP
1 series · 13 of 28 positions shown · non-contrast
Comparison: none

[Series 1: us mfm ob follow-up · 55 acquisitions, 13 frames shown]
[im 3/55]
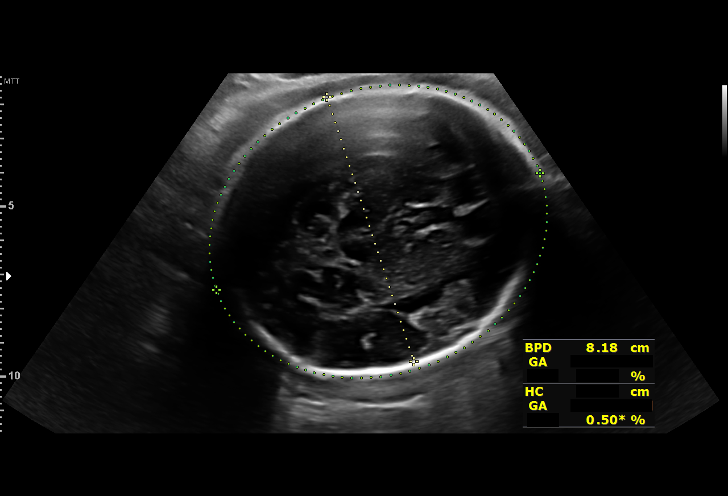
[im 7/55]
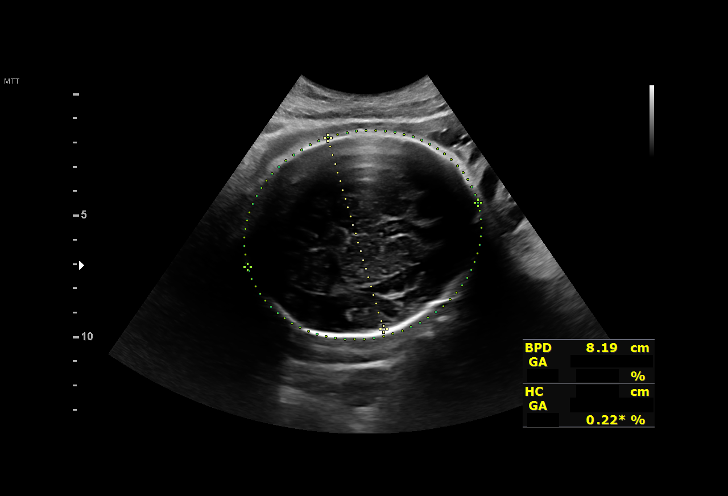
[im 11/55]
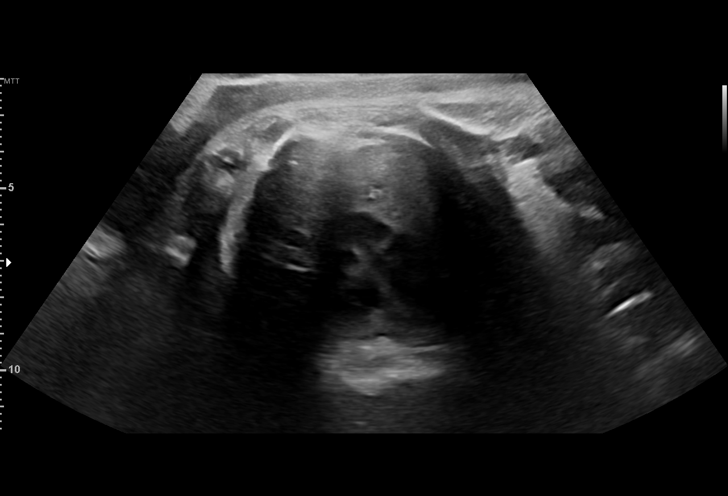
[im 15/55]
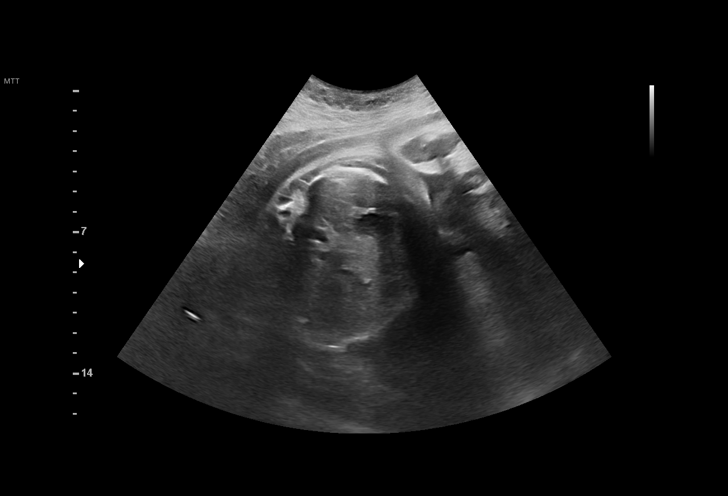
[im 19/55]
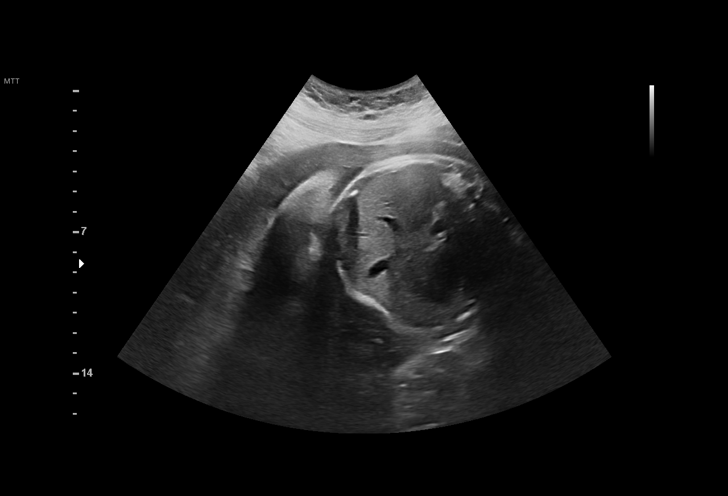
[im 23/55]
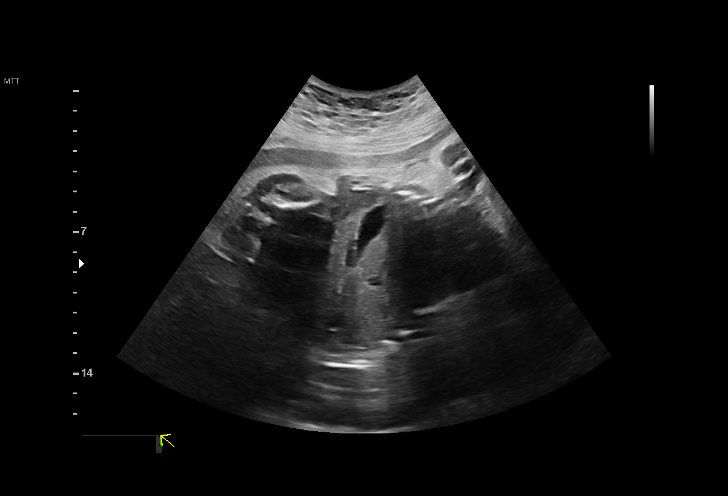
[im 29/55]
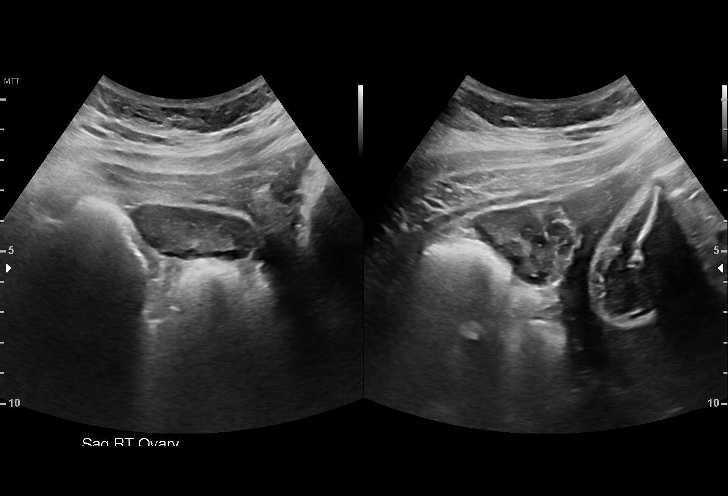
[im 33/55]
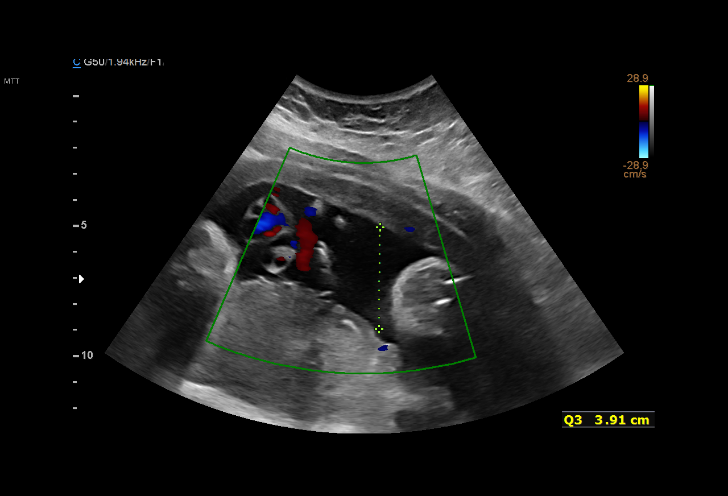
[im 37/55]
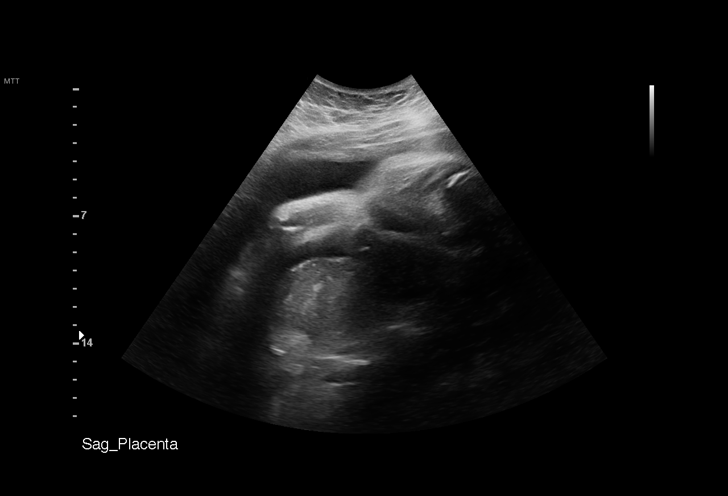
[im 41/55]
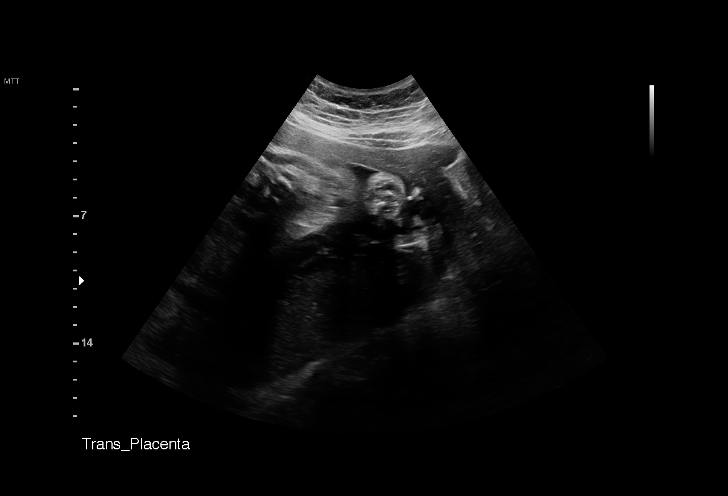
[im 45/55]
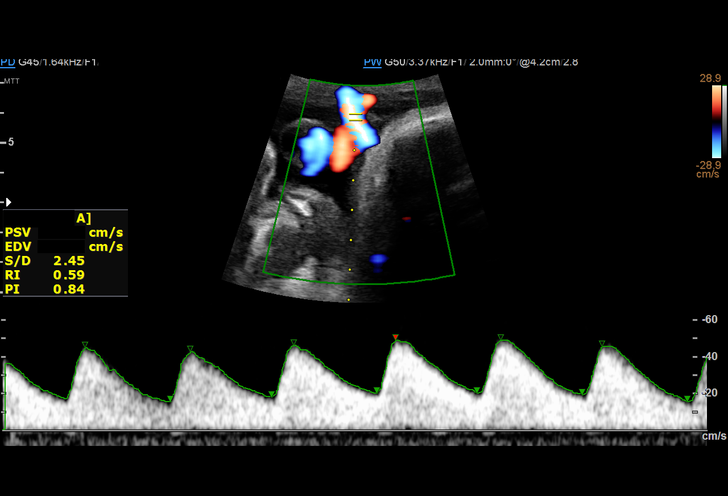
[im 49/55]
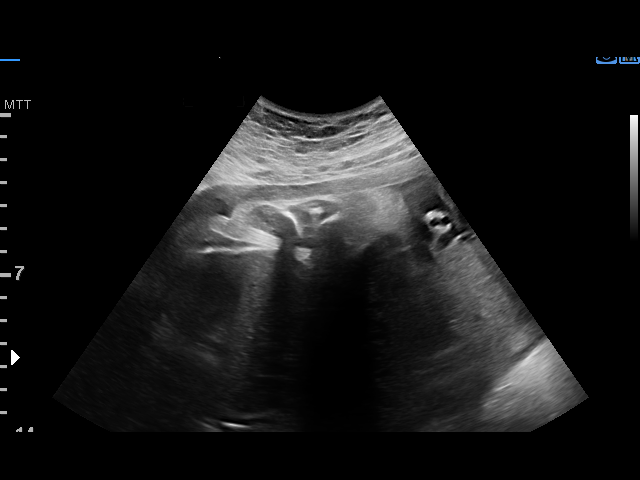
[im 53/55]
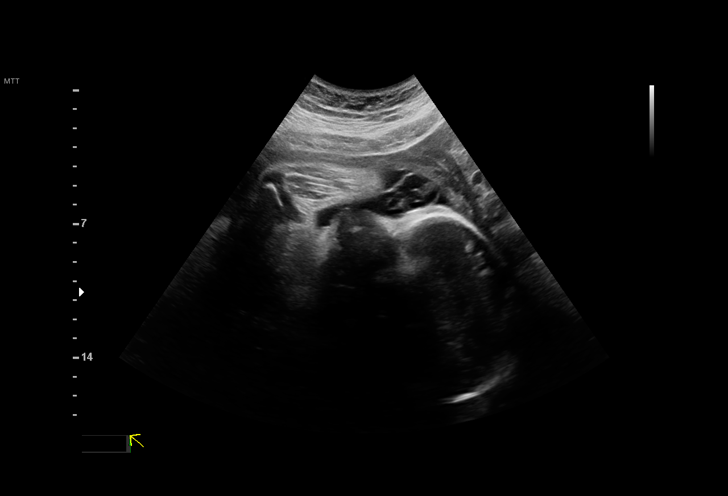

[13 of 28 positions shown; findings below may reference images not displayed]

[REDACTED]care

    W/NONSTRESS

Indications

 Pre-existing diabetes, type 2, in pregnancy,
 third trimester (Diet Controlled)
 34 weeks gestation of pregnancy
 Obesity complicating pregnancy, third
 trimester
 Echogenic intracardiac focus of the heart
 (EIF)
 Encounter for other antenatal screening
 follow-up
Fetal Evaluation

 Num Of Fetuses:         1
 Fetal Heart Rate(bpm):  147
 Cardiac Activity:       Observed
 Presentation:           Cephalic
 Placenta:               Posterior
 P. Cord Insertion:      Previously Visualized

 Amniotic Fluid
 AFI FV:      Within normal limits

 AFI Sum(cm)     %Tile       Largest Pocket(cm)
 13              42
 RUQ(cm)       RLQ(cm)       LUQ(cm)        LLQ(cm)

Biophysical Evaluation

 Amniotic F.V:   Pocket => 2 cm             F. Tone:        Observed
 F. Movement:    Observed                   N.S.T:          Reactive
 F. Breathing:   Not Observed               Score:          [DATE]
Biometry

 BPD:      81.8  mm     G. Age:  32w 6d         13  %    CI:         78.8   %    70 - 86
                                                         FL/HC:      21.0   %    19.4 -
 HC:      291.4  mm     G. Age:  32w 1d        < 1  %    HC/AC:      0.99        0.96 -
 AC:      293.6  mm     G. Age:  33w 2d         27  %    FL/BPD:     74.7   %    71 - 87
 FL:       61.1  mm     G. Age:  31w 5d        1.9  %    FL/AC:      20.8   %    20 - 24

 Est. FW:    3088  gm      4 lb 8 oz      9  %
OB History

 Gravidity:    1         Term:   0        Prem:   0        SAB:   0
 TOP:          0       Ectopic:  0        Living: 0
Gestational Age

 LMP:           34w 2d        Date:  07/22/19                 EDD:   04/27/20
 U/S Today:     32w 4d                                        EDD:   05/09/20
 Best:          34w 2d     Det. By:  LMP  (07/22/19)          EDD:   04/27/20
Anatomy

 Cranium:               Previously seen        Aortic Arch:            Previously seen
 Cavum:                 Previously seen        Ductal Arch:            Previously seen
 Ventricles:            Appears normal         Diaphragm:              Appears normal
 Choroid Plexus:        Previously seen        Stomach:                Previously Seen
 Cerebellum:            Previously seen        Abdomen:                Previously seen
 Posterior Fossa:       Previously seen        Abdominal Wall:         Previously seen
 Nuchal Fold:           Previously seen        Cord Vessels:           Previously seen
 Face:                  Orbits and profile     Kidneys:                Previously seen
                        previously seen
 Lips:                  Previously seen        Bladder:                Previously seen
 Thoracic:              Previously seen        Spine:                  Previously seen
 Heart:                 EIF prev vis           Upper Extremities:      Previously seen
 RVOT:                  Previously seen        Lower Extremities:      Previously seen
 LVOT:                  Previously seen

 Other:  Male gender previously seen. Nasal Bone, 3VV, Heels, and 5th digit
         previously visualized previously.  Technically difficult due to advanced
         GA and fetal position.
Doppler - Fetal Vessels

 Umbilical Artery
  S/D     %tile      RI    %tile                             ADFV    RDFV
  2.72       63    0.63       68                                No      No
Cervix Uterus Adnexa

 Cervix
 Not visualized (advanced GA >28wks)

 Uterus
 No abnormality visualized.

 Right Ovary
 Within normal limits.

 Left Ovary
 Within normal limits.

 Cul De Sac
 No free fluid seen.

 Adnexa
 No adnexal mass visualized.
Comments

 This patient was seen for a follow up growth scan due to
 pregestational diabetes and maternal obesity.  She denies
 any problems since her last exam and reports feeling
 vigorous fetal movements throughout the day.
 On today's exam, the EFW measures at the 9th percentile for
 her gestational age indicating fetal growth restriction.  There
 was normal amniotic fluid noted.
 A biophysical profile performed today due to fetal growth
 restriction was [DATE].  She subsequently had a reactive
 nonstress test making her total biophysical profile score [DATE].
 Doppler studies of the umbilical arteries showed a normal
 S/D ratio of 2.72.  There were no signs of absent or reversed
 end-diastolic flow.
 Due to fetal growth restriction, we will continue to follow her
 with weekly fetal testing and umbilical artery Doppler studies.
 A biophysical profile and umbilical artery Doppler study was
 scheduled in 1 week.

## 2021-12-04 ENCOUNTER — Other Ambulatory Visit: Payer: Self-pay | Admitting: Emergency Medicine

## 2021-12-04 ENCOUNTER — Telehealth: Payer: Self-pay | Admitting: Emergency Medicine

## 2021-12-04 DIAGNOSIS — O219 Vomiting of pregnancy, unspecified: Secondary | ICD-10-CM

## 2021-12-04 MED ORDER — DICLEGIS 10-10 MG PO TBEC: DELAYED_RELEASE_TABLET | ORAL | 6 refills | Status: DC

## 2021-12-04 NOTE — Telephone Encounter (Signed)
RC to patient w/ concerns of nausea and abdominal cramping. Recommended to go to MAU for evaluation for severity of symptoms. May need hydration. Pt states that she cannot go to hospital today. Diclegis ordered by this RN.

## 2021-12-15 DIAGNOSIS — Z419 Encounter for procedure for purposes other than remedying health state, unspecified: Secondary | ICD-10-CM | POA: Diagnosis not present

## 2021-12-19 ENCOUNTER — Ambulatory Visit (INDEPENDENT_AMBULATORY_CARE_PROVIDER_SITE_OTHER): Payer: Medicaid Other

## 2021-12-19 VITALS — BP 121/77 | HR 77 | Ht 63.0 in | Wt 216.0 lb

## 2021-12-19 DIAGNOSIS — Z3A10 10 weeks gestation of pregnancy: Secondary | ICD-10-CM | POA: Diagnosis not present

## 2021-12-19 DIAGNOSIS — O3680X0 Pregnancy with inconclusive fetal viability, not applicable or unspecified: Secondary | ICD-10-CM

## 2021-12-19 DIAGNOSIS — Z3480 Encounter for supervision of other normal pregnancy, unspecified trimester: Secondary | ICD-10-CM | POA: Diagnosis not present

## 2021-12-19 DIAGNOSIS — O0990 Supervision of high risk pregnancy, unspecified, unspecified trimester: Secondary | ICD-10-CM | POA: Insufficient documentation

## 2021-12-19 DIAGNOSIS — F439 Reaction to severe stress, unspecified: Secondary | ICD-10-CM

## 2021-12-19 MED ORDER — GOJJI WEIGHT SCALE MISC: 1.0000 | 0 refills | Status: DC

## 2021-12-19 MED ORDER — BLOOD PRESSURE KIT DEVI: 1.0000 | 0 refills | Status: DC

## 2021-12-19 NOTE — Progress Notes (Cosign Needed)
New OB Intake  I connected with  Roberta Lutz on 12/19/21 at  8:15 AM EDT by in person Video Visit and verified that I am speaking with the correct person using two identifiers. Nurse is located at [facility] and pt is located at [place].  I discussed the limitations, risks, security and privacy concerns of performing an evaluation and management service by telephone and the availability of in person appointments. I also discussed with the patient that there may be a patient responsible charge related to this service. The patient expressed understanding and agreed to proceed.  I explained I am completing New OB Intake today. We discussed her EDD of 07/11/22 that is based on early first trimester u/s. Pt is G2/P1001. I reviewed her allergies, medications, Medical/Surgical/OB history, and appropriate screenings. I informed her of [facility] services. [facility] information placed in AVS. Based on history, this is a/an  pregnancy complicated by Diabetes Type 2  .   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 Patient given information for [facility] website for more information about [facility]. Patient is not interested in water birth. Offered upcoming OB visit with CNM to discuss further.  MyChart/Babyscripts MyChart access verified. I explained pt will have some visits in office and some virtually. Babyscripts instructions given and order placed. Patient verifies receipt of registration text/e-mail. Account successfully created and app downloaded.  Blood Pressure Cuff/Weight Scale Blood pressure cuff ordered for patient to  pick-up from [employer]. Explained after first prenatal appt pt will check weekly and document in Babyscripts. Patient does not  have weight scale. Weight scale ordered for patient to pick up from [employer].   Anatomy US Explained first scheduled [place] will be around 19 weeks. Dating and viability u/s performed today. Anatomy US ordered. Date TBD.  Labs Discussed Avelina Laine genetic screening with patient. Would like both Panorama and Horizon drawn at new OB visit. Routine prenatal labs needed.  Covid Vaccine Patient has not covid vaccine.   Is patient a CenteringPregnancy candidate?  Declined Declined due to Group Setting Not a candidate due to  Centering Patient" indicated on sticky note  Social Determinants of Health Food Insecurity: Patient denies food insecurity. WIC Referral: Patient is interested in referral to [facility].  Transportation: Patient denies transportation needs. Childcare: Discussed no children allowed at ultrasound appointments. Offered childcare services; patient declines childcare services at this time.  First visit review I reviewed new OB appt with pt. I explained she will have a provider visit that includes prenatal labs, pap smear, std screening, genetic screening, discuss plan of care for pregnancy. Explained pt will be seen by Marcheta Grammes at first visit; encounter routed to appropriate provider. Explained that patient will be seen by pregnancy navigator following visit with provider.   Hamilton Capri, RN 12/19/2021  8:22 AM

## 2021-12-24 ENCOUNTER — Encounter (HOSPITAL_COMMUNITY): Payer: Self-pay | Admitting: Obstetrics and Gynecology

## 2021-12-24 ENCOUNTER — Inpatient Hospital Stay (HOSPITAL_COMMUNITY)
Admission: AD | Admit: 2021-12-24 | Discharge: 2021-12-24 | Disposition: A | Discharge status: Home / Self Care | Payer: Medicaid Other | Attending: Obstetrics and Gynecology | Admitting: Obstetrics and Gynecology

## 2021-12-24 ENCOUNTER — Inpatient Hospital Stay (HOSPITAL_COMMUNITY): Payer: Medicaid Other

## 2021-12-24 DIAGNOSIS — A084 Viral intestinal infection, unspecified: Secondary | ICD-10-CM | POA: Diagnosis not present

## 2021-12-24 DIAGNOSIS — R112 Nausea with vomiting, unspecified: Secondary | ICD-10-CM | POA: Diagnosis not present

## 2021-12-24 DIAGNOSIS — O218 Other vomiting complicating pregnancy: Secondary | ICD-10-CM | POA: Insufficient documentation

## 2021-12-24 DIAGNOSIS — Z3A11 11 weeks gestation of pregnancy: Secondary | ICD-10-CM | POA: Insufficient documentation

## 2021-12-24 DIAGNOSIS — K529 Noninfective gastroenteritis and colitis, unspecified: Secondary | ICD-10-CM

## 2021-12-24 DIAGNOSIS — R102 Pelvic and perineal pain: Secondary | ICD-10-CM | POA: Diagnosis not present

## 2021-12-24 DIAGNOSIS — R109 Unspecified abdominal pain: Secondary | ICD-10-CM | POA: Diagnosis not present

## 2021-12-24 DIAGNOSIS — Z3A01 Less than 8 weeks gestation of pregnancy: Secondary | ICD-10-CM | POA: Diagnosis not present

## 2021-12-24 DIAGNOSIS — O26891 Other specified pregnancy related conditions, first trimester: Secondary | ICD-10-CM | POA: Insufficient documentation

## 2021-12-24 DIAGNOSIS — R197 Diarrhea, unspecified: Secondary | ICD-10-CM

## 2021-12-24 LAB — CBC
HCT: 36 % (ref 36.0–46.0)
Hemoglobin: 11.7 g/dL — ABNORMAL LOW (ref 12.0–15.0)
MCH: 26.4 pg (ref 26.0–34.0)
MCHC: 32.5 g/dL (ref 30.0–36.0)
MCV: 81.3 fL (ref 80.0–100.0)
Platelets: 378 10*3/uL (ref 150–400)
RBC: 4.43 MIL/uL (ref 3.87–5.11)
RDW: 13.3 % (ref 11.5–15.5)
WBC: 12.7 10*3/uL — ABNORMAL HIGH (ref 4.0–10.5)
nRBC: 0 % (ref 0.0–0.2)

## 2021-12-24 LAB — URINALYSIS, ROUTINE W REFLEX MICROSCOPIC
Bacteria, UA: NONE SEEN
Bilirubin Urine: NEGATIVE
Glucose, UA: NEGATIVE mg/dL
Hgb urine dipstick: NEGATIVE
Ketones, ur: 5 mg/dL — AB
Leukocytes,Ua: NEGATIVE
Nitrite: NEGATIVE
Protein, ur: 100 mg/dL — AB
Specific Gravity, Urine: 1.027 (ref 1.005–1.030)
pH: 5 (ref 5.0–8.0)

## 2021-12-24 LAB — COMPREHENSIVE METABOLIC PANEL
ALT: 48 U/L — ABNORMAL HIGH (ref 0–44)
AST: 37 U/L (ref 15–41)
Albumin: 3.6 g/dL (ref 3.5–5.0)
Alkaline Phosphatase: 52 U/L (ref 38–126)
Anion gap: 13 (ref 5–15)
BUN: 6 mg/dL (ref 6–20)
CO2: 19 mmol/L — ABNORMAL LOW (ref 22–32)
Calcium: 9.1 mg/dL (ref 8.9–10.3)
Chloride: 104 mmol/L (ref 98–111)
Creatinine, Ser: 0.63 mg/dL (ref 0.44–1.00)
GFR, Estimated: >60 mL/min (ref >60)
Glucose, Bld: 98 mg/dL (ref 70–99)
Potassium: 3.6 mmol/L (ref 3.5–5.1)
Sodium: 136 mmol/L (ref 135–145)
Total Bilirubin: 0.3 mg/dL (ref 0.3–1.2)
Total Protein: 6.7 g/dL (ref 6.5–8.1)

## 2021-12-24 LAB — HCG, QUANTITATIVE, PREGNANCY: hCG, Beta Chain, Quant, S: 31207 m[IU]/mL — ABNORMAL HIGH (ref <5)

## 2021-12-24 MED ORDER — FAMOTIDINE IN NACL 20-0.9 MG/50ML-% IV SOLN
20.0000 mg | Freq: Once | INTRAVENOUS | Status: AC
  Administered 2021-12-24: 20 mg via INTRAVENOUS
  Filled 2021-12-24: qty 50

## 2021-12-24 MED ORDER — LOPERAMIDE HCL 2 MG PO CAPS
2.0000 mg | ORAL_CAPSULE | Freq: Once | ORAL | Status: AC
  Administered 2021-12-24: 2 mg via ORAL
  Filled 2021-12-24: qty 1

## 2021-12-24 MED ORDER — SCOPOLAMINE 1 MG/3DAYS TD PT72
1.0000 | MEDICATED_PATCH | Freq: Once | TRANSDERMAL | Status: DC
  Administered 2021-12-24: 1.5 mg via TRANSDERMAL
  Filled 2021-12-24: qty 1

## 2021-12-24 MED ORDER — ONDANSETRON 4 MG PO TBDP: 4.0000 mg | ORAL_TABLET | Freq: Once | ORAL | Status: DC

## 2021-12-24 MED ORDER — LACTATED RINGERS IV BOLUS
1000.0000 mL | Freq: Once | INTRAVENOUS | Status: AC
  Administered 2021-12-24: 1000 mL via INTRAVENOUS

## 2021-12-24 NOTE — MAU Note (Signed)
.  Roberta Lutz is a 26 y.o. at [redacted]w[redacted]d here in MAU reporting: has been having abd pain/cramping N/V/D for the past 4 hrs.   Onset of complaint: 4 hrs Pain score: 8 Vitals:   12/24/21 1631  BP: 132/66  Pulse: 71  Resp: 18  Temp: 98.1 F (36.7 C)     FHT:n/a Lab orders placed from triage:  u/a

## 2021-12-24 NOTE — MAU Provider Note (Signed)
History     CSN: [id number]  Arrival date and time: 12/24/21 1604   Event Date/Time   First Provider Initiated Contact with Patient 12/24/21 1729      Chief Complaint  Patient presents with   Abdominal Pain   Emesis   Roberta Lutz, a  26 y.o. G2P1001 at [redacted]w[redacted]d presents to MAU with complaints of nausea, vomiting, diarrhea, abdominal cramping, headache and back pain since 1130am. Patient states it was a sudden onset. Meal recall was strawberries and apple juice at 9:30. Notes 5 episodes of vomiting since onset. Denies attempting trying to relieve symptoms. She denies vaginal bleeding, fever chills, shortness of breath vaginal and urinary symptoms. She endorses cleaning fruits prior to ingestion.  Patient denies contact with other sick individuals. States she has only been around her 28 year old and he has been fine. FHT were obtained in triage.      OB History     Gravida  2   Para  1   Term  1   Preterm      AB      Living  1      SAB      IAB      Ectopic      Multiple  0   Live Births  1           Past Medical History:  Diagnosis Date   Anxiety    Diabetes mellitus without complication ([place])    Type II   Gestational hypertension 04/06/2020   Headache    PCOS (polycystic ovarian syndrome)    Pilonidal cyst    Rosacea    Type 2 diabetes mellitus during pregnancy, third trimester 11/24/2019   Note from 2017 Called and spoke with patient regarding blood sugar of 536. Advised her to be seen in ED for recheck and further management. She expressed understanding. I then spoke with Dr. Shaaron Adler who advised that given patient's relative lack of symptoms and no other abnormalities on CMP (no acidosis), it would be reasonable for her to follow up with PCP on Monday.      Past Surgical History:  Procedure Laterality Date   DRUG INDUCED ENDOSCOPY     Gastritis    Family History  Problem Relation Age of Onset   Diabetes Mother    Asthma Mother    ADD / ADHD  Brother    Asthma Brother    Diabetes Maternal Grandmother    Arthritis Maternal Grandmother    Cancer Maternal Grandfather     Social History   Tobacco Use   Smoking status: Never   Smokeless tobacco: Never  Vaping Use   Vaping Use: Never used  Substance Use Topics   Alcohol use: Not Currently    Comment: 3-4 times per week prior to confirmed pregnancy   Drug use: No    Allergies: No Known Allergies  Medications Prior to Admission  Medication Sig Dispense Refill Last Dose   Prenatal Vit-Fe Fumarate-FA (PRENATAL MULTIVITAMIN) TABS tablet Take 1 tablet by mouth daily at 12 noon.   12/23/2021   Blood Pressure Monitoring (BLOOD PRESSURE KIT) DEVI 1 kit by Does not apply route once a week. 1 each 0    DICLEGIS 10-10 MG TBEC Take 2 tabs qhs then add 1 tab A.M and midday prn 90 tablet 6 12/10/2021   ibuprofen (ADVIL) 800 MG tablet Take 1 tablet (800 mg total) by mouth 3 (three) times daily. (Patient not taking: Reported on 12/19/2021) 30 tablet 2  Misc. Devices (GOJJI WEIGHT SCALE) MISC 1 Device by Does not apply route every 30 (thirty) days. 1 each 0    promethazine (PHENERGAN) 25 MG tablet Take 1 tablet (25 mg total) by mouth every 6 (six) hours as needed for nausea or vomiting. (Patient not taking: Reported on 12/19/2021) 30 tablet 1     Review of Systems  Constitutional:  Negative for chills, fatigue and fever.  Eyes:  Negative for pain and visual disturbance.  Respiratory:  Negative for apnea, shortness of breath and wheezing.   Cardiovascular:  Negative for chest pain and palpitations.  Gastrointestinal:  Positive for abdominal pain, diarrhea, nausea and vomiting. Negative for constipation.  Genitourinary:  Positive for pelvic pain. Negative for difficulty urinating, dysuria, vaginal bleeding, vaginal discharge and vaginal pain.  Musculoskeletal:  Positive for back pain.  Allergic/Immunologic: Negative for food allergies and immunocompromised state.  Neurological:  Positive for  headaches. Negative for dizziness, seizures, weakness and light-headedness.  Psychiatric/Behavioral:  Negative for suicidal ideas.    Physical Exam   Blood pressure 132/66, pulse 71, temperature 98.1 F (36.7 C), resp. rate 18, height $RemoveBe'5\' 3"'OzEctUoYr$  (1.6 m), weight 98 kg, last menstrual period 09/24/2021, not currently breastfeeding.  Physical Exam Vitals and nursing note reviewed.  Constitutional:      General: She is not in acute distress.    Appearance: Normal appearance. She is ill-appearing.  HENT:     Head: Normocephalic.  Cardiovascular:     Rate and Rhythm: Normal rate and regular rhythm.  Pulmonary:     Effort: Pulmonary effort is normal.     Breath sounds: Normal breath sounds.  Abdominal:     General: Bowel sounds are increased.     Palpations: Abdomen is soft.     Tenderness: There is no abdominal tenderness. There is no right CVA tenderness or left CVA tenderness.  Musculoskeletal:     Cervical back: Normal range of motion.  Skin:    General: Skin is warm and dry.  Neurological:     Mental Status: She is alert and oriented to person, place, and time.  Psychiatric:        Mood and Affect: Mood normal.     MAU Course  Procedures Orders Placed This Encounter  Procedures   Culture, OB Urine   US OB LESS THAN 14 WEEKS WITH OB TRANSVAGINAL   Urinalysis, Routine w reflex microscopic Urine, Clean Catch   CBC   Comprehensive metabolic panel   hCG, quantitative, pregnancy   Insert peripheral IV   Meds ordered this encounter  Medications   lactated ringers bolus 1,000 mL   famotidine (PEPCID) IVPB 20 mg premix   scopolamine (TRANSDERM-SCOP) 1 MG/3DAYS 1.5 mg   loperamide (IMODIUM) capsule 2 mg   Results for orders placed or performed during the hospital encounter of 12/24/21 (from the past 24 hour(s))  Urinalysis, Routine w reflex microscopic Urine, Clean Catch     Status: Abnormal   Collection Time: 12/24/21  4:41 PM  Result Value Ref Range   Color, Urine AMBER (A)  YELLOW   APPearance TURBID (A) CLEAR   Specific Gravity, Urine 1.027 1.005 - 1.030   pH 5.0 5.0 - 8.0   Glucose, UA NEGATIVE NEGATIVE mg/dL   Hgb urine dipstick NEGATIVE NEGATIVE   Bilirubin Urine NEGATIVE NEGATIVE   Ketones, ur 5 (A) NEGATIVE mg/dL   Protein, ur 100 (A) NEGATIVE mg/dL   Nitrite NEGATIVE NEGATIVE   Leukocytes,Ua NEGATIVE NEGATIVE   RBC / HPF 0-5 0 -  5 RBC/hpf   WBC, UA 0-5 0 - 5 WBC/hpf   Bacteria, UA NONE SEEN NONE SEEN   Squamous Epithelial / LPF 21-50 0 - 5   Mucus PRESENT    Hyaline Casts, UA PRESENT   Culture, OB Urine     Status: Abnormal   Collection Time: 12/24/21  4:41 PM   Specimen: Urine, Random  Result Value Ref Range   Specimen Description URINE, RANDOM    Special Requests NONE    Culture (A)     MULTIPLE SPECIES PRESENT, SUGGEST RECOLLECTION NO GROUP B STREP (S.AGALACTIAE) ISOLATED Performed at [facility], [place] [address], [place], [place] [address]    Report Status 12/25/2021 FINAL   CBC     Status: Abnormal   Collection Time: 12/24/21  6:04 PM  Result Value Ref Range   WBC 12.7 (H) 4.0 - 10.5 K/uL   RBC 4.43 3.87 - 5.11 MIL/uL   Hemoglobin 11.7 (L) 12.0 - 15.0 g/dL   HCT 36.0 36.0 - 46.0 %   MCV 81.3 80.0 - 100.0 fL   MCH 26.4 26.0 - 34.0 pg   MCHC 32.5 30.0 - 36.0 g/dL   RDW 13.3 11.5 - 15.5 %   Platelets 378 150 - 400 K/uL   nRBC 0.0 0.0 - 0.2 %  Comprehensive metabolic panel     Status: Abnormal   Collection Time: 12/24/21  6:04 PM  Result Value Ref Range   Sodium 136 135 - 145 mmol/L   Potassium 3.6 3.5 - 5.1 mmol/L   Chloride 104 98 - 111 mmol/L   CO2 19 (L) 22 - 32 mmol/L   Glucose, Bld 98 70 - 99 mg/dL   BUN 6 6 - 20 mg/dL   Creatinine, Ser 0.63 0.44 - 1.00 mg/dL   Calcium 9.1 8.9 - 10.3 mg/dL   Total Protein 6.7 6.5 - 8.1 g/dL   Albumin 3.6 3.5 - 5.0 g/dL   AST 37 15 - 41 U/L   ALT 48 (H) 0 - 44 U/L   Alkaline Phosphatase 52 38 - 126 U/L   Total Bilirubin 0.3 0.3 - 1.2 mg/dL   GFR, Estimated >60 >60 mL/min    Anion gap 13 5 - 15  hCG, quantitative, pregnancy     Status: Abnormal   Collection Time: 12/24/21  6:04 PM  Result Value Ref Range   hCG, Beta Chain, Quant, S 31,207 (H) <5 mIU/mL   US OB Comp Less 14 Wks  Result Date: 12/24/2021 CLINICAL DATA:  Cramping EXAM: OBSTETRIC <14 WK ULTRASOUND TECHNIQUE: Transabdominal ultrasound was performed for evaluation of the gestation as well as the maternal uterus and adnexal regions. COMPARISON:  12/19/2021 FINDINGS: Intrauterine gestational sac: Single Yolk sac:  Not Visualized. Embryo:  Visualized. Cardiac Activity: Visualized. Heart Rate: 170 bpm MSD:    Mm    w     d CRL:   40.3 mm   11 w 4 d                  [place] EDC: 07/11/2022 Subchorionic hemorrhage:  None visualized. Maternal uterus/adnexae: No adnexal mass or free fluid. IMPRESSION: Eleven week 4 day intrauterine pregnancy. Fetal heart rate 170 beats per minute. No acute maternal findings. Electronically Signed   By: Rolm Baptise M.D.   On: 12/24/2021 18:59     MDM Lab results reviewed and interpreted by me.   UA turbid with 5 ketones and 100 of Protein. IV fluids ordered with IV Pepcid and  a scop patch.  - PO Imodium for diarrhea.  - US revealed a normal IUP with cardiac activity and no acute maternal findings.  - Low suspicion for Preterm labor, or pregnancy related problems.  - Patient Afebrile . White Count elevated, but normal finding in pregnancy. Low suspicion for infection   Reassessment  - V and diarrhea improved with fluids and medication.  - Patient still complains of nausea. PO Zofran ordered.  - Headache and back pain improved from 8/10-2/10 without intervention.  - UA reflexed to culture.    Reassessment  - Nausea improved with scop patch and PO zofran.  - Patient states she feels much better.  - Plan for discharge.     Assessment and Plan   1. Viral gastroenteritis   2. Nausea and vomiting, unspecified vomiting type   3. Diarrhea, unspecified type   4. [redacted] weeks  gestation of pregnancy   - Discussed that this is likely viral gastroenteritis (stomach bug). Recommended  PO increase PO hydration as tolerated.  - Encouraged continued use of Scop patch for next 3 days.  - Dicussed worsening symptoms and return precautions.  - Early labor precautions reviewed.  - Patient stable upon discharge and may return to MAU as needed.    Jacquiline Doe, MSN  CNM  12/24/2021, 5:29 PM

## 2021-12-25 ENCOUNTER — Other Ambulatory Visit: Payer: Self-pay

## 2021-12-25 DIAGNOSIS — O219 Vomiting of pregnancy, unspecified: Secondary | ICD-10-CM

## 2021-12-25 LAB — CULTURE, OB URINE

## 2022-01-11 ENCOUNTER — Institutional Professional Consult (permissible substitution): Payer: Medicaid Other | Admitting: Licensed Clinical Social Worker

## 2022-01-11 ENCOUNTER — Encounter: Payer: Medicaid Other | Admitting: Family Medicine

## 2022-01-14 DIAGNOSIS — Z419 Encounter for procedure for purposes other than remedying health state, unspecified: Secondary | ICD-10-CM | POA: Diagnosis not present

## 2022-01-22 ENCOUNTER — Encounter: Payer: Medicaid Other | Admitting: Licensed Clinical Social Worker

## 2022-01-22 ENCOUNTER — Other Ambulatory Visit (HOSPITAL_COMMUNITY)
Admission: RE | Admit: 2022-01-22 | Discharge: 2022-01-22 | Disposition: A | Discharge status: Home / Self Care | Payer: Medicaid Other | Source: Ambulatory Visit | Attending: Family Medicine | Admitting: Family Medicine

## 2022-01-22 ENCOUNTER — Encounter (HOSPITAL_COMMUNITY): Payer: Self-pay | Admitting: Family Medicine

## 2022-01-22 ENCOUNTER — Ambulatory Visit (INDEPENDENT_AMBULATORY_CARE_PROVIDER_SITE_OTHER): Payer: Medicaid Other | Admitting: Obstetrics and Gynecology

## 2022-01-22 ENCOUNTER — Other Ambulatory Visit: Payer: Self-pay

## 2022-01-22 ENCOUNTER — Inpatient Hospital Stay (HOSPITAL_COMMUNITY)
Admission: AD | Admit: 2022-01-22 | Discharge: 2022-01-22 | Disposition: A | Discharge status: Home / Self Care | Payer: Medicaid Other | Attending: Family Medicine | Admitting: Family Medicine

## 2022-01-22 ENCOUNTER — Inpatient Hospital Stay (HOSPITAL_COMMUNITY): Payer: Medicaid Other

## 2022-01-22 VITALS — BP 116/71 | HR 92 | Wt 217.0 lb

## 2022-01-22 DIAGNOSIS — R0602 Shortness of breath: Secondary | ICD-10-CM | POA: Diagnosis not present

## 2022-01-22 DIAGNOSIS — J45909 Unspecified asthma, uncomplicated: Secondary | ICD-10-CM | POA: Diagnosis not present

## 2022-01-22 DIAGNOSIS — Z1152 Encounter for screening for COVID-19: Secondary | ICD-10-CM | POA: Insufficient documentation

## 2022-01-22 DIAGNOSIS — Z3A15 15 weeks gestation of pregnancy: Secondary | ICD-10-CM | POA: Insufficient documentation

## 2022-01-22 DIAGNOSIS — O99512 Diseases of the respiratory system complicating pregnancy, second trimester: Secondary | ICD-10-CM | POA: Insufficient documentation

## 2022-01-22 DIAGNOSIS — Z3480 Encounter for supervision of other normal pregnancy, unspecified trimester: Secondary | ICD-10-CM

## 2022-01-22 DIAGNOSIS — Z3482 Encounter for supervision of other normal pregnancy, second trimester: Secondary | ICD-10-CM

## 2022-01-22 DIAGNOSIS — O09292 Supervision of pregnancy with other poor reproductive or obstetric history, second trimester: Secondary | ICD-10-CM

## 2022-01-22 DIAGNOSIS — O09299 Supervision of pregnancy with other poor reproductive or obstetric history, unspecified trimester: Secondary | ICD-10-CM

## 2022-01-22 LAB — URINALYSIS, ROUTINE W REFLEX MICROSCOPIC
Bilirubin Urine: NEGATIVE
Glucose, UA: NEGATIVE mg/dL
Hgb urine dipstick: NEGATIVE
Ketones, ur: NEGATIVE mg/dL
Leukocytes,Ua: NEGATIVE
Nitrite: NEGATIVE
Protein, ur: 30 mg/dL — AB
Specific Gravity, Urine: 1.019 (ref 1.005–1.030)
pH: 7 (ref 5.0–8.0)

## 2022-01-22 LAB — CBC WITH DIFFERENTIAL/PLATELET
Abs Immature Granulocytes: 0.05 10*3/uL (ref 0.00–0.07)
Basophils Absolute: 0 10*3/uL (ref 0.0–0.1)
Basophils Relative: 0 %
Eosinophils Absolute: 0.4 10*3/uL (ref 0.0–0.5)
Eosinophils Relative: 2 %
HCT: 36 % (ref 36.0–46.0)
Hemoglobin: 11.8 g/dL — ABNORMAL LOW (ref 12.0–15.0)
Immature Granulocytes: 0 %
Lymphocytes Relative: 22 %
Lymphs Abs: 3.3 10*3/uL (ref 0.7–4.0)
MCH: 26 pg (ref 26.0–34.0)
MCHC: 32.8 g/dL (ref 30.0–36.0)
MCV: 79.5 fL — ABNORMAL LOW (ref 80.0–100.0)
Monocytes Absolute: 0.9 10*3/uL (ref 0.1–1.0)
Monocytes Relative: 6 %
Neutro Abs: 10.7 10*3/uL — ABNORMAL HIGH (ref 1.7–7.7)
Neutrophils Relative %: 70 %
Platelets: 385 10*3/uL (ref 150–400)
RBC: 4.53 MIL/uL (ref 3.87–5.11)
RDW: 13.2 % (ref 11.5–15.5)
WBC: 15.4 10*3/uL — ABNORMAL HIGH (ref 4.0–10.5)
nRBC: 0 % (ref 0.0–0.2)

## 2022-01-22 LAB — COMPREHENSIVE METABOLIC PANEL
ALT: 18 U/L (ref 0–44)
AST: 18 U/L (ref 15–41)
Albumin: 3.4 g/dL — ABNORMAL LOW (ref 3.5–5.0)
Alkaline Phosphatase: 56 U/L (ref 38–126)
Anion gap: 6 (ref 5–15)
BUN: 5 mg/dL — ABNORMAL LOW (ref 6–20)
CO2: 22 mmol/L (ref 22–32)
Calcium: 9.1 mg/dL (ref 8.9–10.3)
Chloride: 107 mmol/L (ref 98–111)
Creatinine, Ser: 0.69 mg/dL (ref 0.44–1.00)
GFR, Estimated: >60 mL/min (ref >60)
Glucose, Bld: 88 mg/dL (ref 70–99)
Potassium: 3.6 mmol/L (ref 3.5–5.1)
Sodium: 135 mmol/L (ref 135–145)
Total Bilirubin: 0.3 mg/dL (ref 0.3–1.2)
Total Protein: 7 g/dL (ref 6.5–8.1)

## 2022-01-22 LAB — RESP PANEL BY RT-PCR (FLU A&B, COVID) ARPGX2
Influenza A by PCR: NEGATIVE
Influenza B by PCR: NEGATIVE
SARS Coronavirus 2 by RT PCR: NEGATIVE

## 2022-01-22 MED ORDER — ASPIRIN 81 MG PO TBEC: 81.0000 mg | DELAYED_RELEASE_TABLET | Freq: Every day | ORAL | 12 refills | Status: DC

## 2022-01-22 MED ORDER — IPRATROPIUM-ALBUTEROL 0.5-2.5 (3) MG/3ML IN SOLN
3.0000 mL | Freq: Once | RESPIRATORY_TRACT | Status: AC
  Administered 2022-01-22: 3 mL via RESPIRATORY_TRACT
  Filled 2022-01-22: qty 3

## 2022-01-22 MED ORDER — ALBUTEROL SULFATE HFA 108 (90 BASE) MCG/ACT IN AERS: 2.0000 | INHALATION_SPRAY | Freq: Four times a day (QID) | RESPIRATORY_TRACT | 2 refills | Status: DC | PRN

## 2022-01-22 NOTE — MAU Note (Signed)
.  Roberta Lutz is a 26 y.o. at [redacted]w[redacted]d here in MAU reporting: Pt reports SOB since last night.  Onset of complaint: last night  Pain score: chest tightness 7/10 There were no vitals filed for this visit.    Lab orders placed from triage:   ua/ covid swab
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
## 2022-01-23 ENCOUNTER — Encounter: Payer: Medicaid Other | Admitting: Licensed Clinical Social Worker

## 2022-01-23 ENCOUNTER — Telehealth: Payer: Self-pay | Admitting: Licensed Clinical Social Worker

## 2022-01-23 LAB — CERVICOVAGINAL ANCILLARY ONLY
Bacterial Vaginitis (gardnerella): POSITIVE — AB
Candida Glabrata: NEGATIVE
Candida Vaginitis: NEGATIVE
Chlamydia: NEGATIVE
Comment: NEGATIVE
Comment: NEGATIVE
Comment: NEGATIVE
Comment: NEGATIVE
Comment: NEGATIVE
Comment: NORMAL
Neisseria Gonorrhea: NEGATIVE
Trichomonas: NEGATIVE

## 2022-01-23 NOTE — Telephone Encounter (Signed)
Called pt regarding scheduled mychart visit. No answer. Unable to leave voicemail due to mailbox is full.

## 2022-01-24 LAB — CBC/D/PLT+RPR+RH+ABO+RUBIGG...
Antibody Screen: NEGATIVE
Basophils Absolute: 0 10*3/uL (ref 0.0–0.2)
Basos: 0 %
EOS (ABSOLUTE): 0.4 10*3/uL (ref 0.0–0.4)
Eos: 3 %
HCV Ab: NONREACTIVE
HIV Screen 4th Generation wRfx: NONREACTIVE
Hematocrit: 34.8 % (ref 34.0–46.6)
Hemoglobin: 11.6 g/dL (ref 11.1–15.9)
Hepatitis B Surface Ag: NEGATIVE
Immature Grans (Abs): 0 10*3/uL (ref 0.0–0.1)
Immature Granulocytes: 0 %
Lymphocytes Absolute: 3.1 10*3/uL (ref 0.7–3.1)
Lymphs: 25 %
MCH: 26 pg — ABNORMAL LOW (ref 26.6–33.0)
MCHC: 33.3 g/dL (ref 31.5–35.7)
MCV: 78 fL — ABNORMAL LOW (ref 79–97)
Monocytes Absolute: 0.8 10*3/uL (ref 0.1–0.9)
Monocytes: 7 %
Neutrophils Absolute: 8.2 10*3/uL — ABNORMAL HIGH (ref 1.4–7.0)
Neutrophils: 65 %
Platelets: 385 10*3/uL (ref 150–450)
RBC: 4.46 x10E6/uL (ref 3.77–5.28)
RDW: 13.3 % (ref 11.7–15.4)
RPR Ser Ql: NONREACTIVE
Rh Factor: POSITIVE
Rubella Antibodies, IGG: 1.58 index (ref >0.99)
WBC: 12.6 10*3/uL — ABNORMAL HIGH (ref 3.4–10.8)

## 2022-01-24 LAB — AFP, SERUM, OPEN SPINA BIFIDA
AFP MoM: 0.78
AFP Value: 20.3 ng/mL
Gest. Age on Collection Date: 15 weeks
Maternal Age At EDD: 28.6 yr
OSBR Risk 1 IN: 10000
Test Results:: NEGATIVE
Weight: 217 [lb_av]

## 2022-01-24 LAB — HEMOGLOBIN A1C
Est. average glucose Bld gHb Est-mCnc: 120 mg/dL
Hgb A1c MFr Bld: 5.8 % — ABNORMAL HIGH (ref 4.8–5.6)

## 2022-01-24 LAB — HCV INTERPRETATION

## 2022-01-27 LAB — PANORAMA PRENATAL TEST FULL PANEL:PANORAMA TEST PLUS 5 ADDITIONAL MICRODELETIONS: FETAL FRACTION: 2.4

## 2022-01-30 ENCOUNTER — Telehealth: Payer: Self-pay

## 2022-01-30 ENCOUNTER — Other Ambulatory Visit: Payer: Self-pay | Admitting: Obstetrics and Gynecology

## 2022-01-30 DIAGNOSIS — B9689 Other specified bacterial agents as the cause of diseases classified elsewhere: Secondary | ICD-10-CM

## 2022-01-30 MED ORDER — METRONIDAZOLE 0.75 % VA GEL: 1.0000 | Freq: Every day | VAGINAL | 1 refills | Status: DC

## 2022-01-30 NOTE — Telephone Encounter (Signed)
Called patient to inform of insufficient fetal DNA on Panorama test. Patient given the option to redraw. Patient states that she will wait on her anatomy scan and declines redraw at this time.

## 2022-02-14 ENCOUNTER — Ambulatory Visit: Payer: Medicaid Other

## 2022-02-14 DIAGNOSIS — Z419 Encounter for procedure for purposes other than remedying health state, unspecified: Secondary | ICD-10-CM | POA: Diagnosis not present

## 2022-02-16 ENCOUNTER — Other Ambulatory Visit: Payer: Self-pay | Admitting: *Deleted

## 2022-02-16 ENCOUNTER — Ambulatory Visit: Payer: Medicaid Other | Attending: Obstetrics and Gynecology

## 2022-02-16 ENCOUNTER — Ambulatory Visit: Payer: Medicaid Other | Admitting: *Deleted

## 2022-02-16 VITALS — BP 113/65 | HR 88

## 2022-02-16 DIAGNOSIS — Z148 Genetic carrier of other disease: Secondary | ICD-10-CM | POA: Diagnosis not present

## 2022-02-16 DIAGNOSIS — E669 Obesity, unspecified: Secondary | ICD-10-CM | POA: Diagnosis not present

## 2022-02-16 DIAGNOSIS — Z362 Encounter for other antenatal screening follow-up: Secondary | ICD-10-CM | POA: Diagnosis not present

## 2022-02-16 DIAGNOSIS — Z363 Encounter for antenatal screening for malformations: Secondary | ICD-10-CM | POA: Insufficient documentation

## 2022-02-16 DIAGNOSIS — Z3A19 19 weeks gestation of pregnancy: Secondary | ICD-10-CM | POA: Diagnosis not present

## 2022-02-16 DIAGNOSIS — Z3480 Encounter for supervision of other normal pregnancy, unspecified trimester: Secondary | ICD-10-CM

## 2022-02-16 DIAGNOSIS — O99212 Obesity complicating pregnancy, second trimester: Secondary | ICD-10-CM | POA: Insufficient documentation

## 2022-02-16 DIAGNOSIS — O99891 Other specified diseases and conditions complicating pregnancy: Secondary | ICD-10-CM | POA: Diagnosis not present

## 2022-02-21 ENCOUNTER — Encounter: Payer: Self-pay | Admitting: Student

## 2022-02-21 ENCOUNTER — Ambulatory Visit (INDEPENDENT_AMBULATORY_CARE_PROVIDER_SITE_OTHER): Payer: Medicaid Other | Admitting: Student

## 2022-02-21 VITALS — BP 113/72 | HR 85 | Wt 225.2 lb

## 2022-02-21 DIAGNOSIS — O99519 Diseases of the respiratory system complicating pregnancy, unspecified trimester: Secondary | ICD-10-CM

## 2022-02-21 DIAGNOSIS — O24112 Pre-existing diabetes mellitus, type 2, in pregnancy, second trimester: Secondary | ICD-10-CM | POA: Diagnosis not present

## 2022-02-21 DIAGNOSIS — J45909 Unspecified asthma, uncomplicated: Secondary | ICD-10-CM

## 2022-02-21 DIAGNOSIS — Z3A20 20 weeks gestation of pregnancy: Secondary | ICD-10-CM

## 2022-02-21 DIAGNOSIS — Z3482 Encounter for supervision of other normal pregnancy, second trimester: Secondary | ICD-10-CM

## 2022-02-21 DIAGNOSIS — O09299 Supervision of pregnancy with other poor reproductive or obstetric history, unspecified trimester: Secondary | ICD-10-CM

## 2022-02-21 DIAGNOSIS — O99212 Obesity complicating pregnancy, second trimester: Secondary | ICD-10-CM

## 2022-02-21 DIAGNOSIS — D563 Thalassemia minor: Secondary | ICD-10-CM

## 2022-02-21 DIAGNOSIS — Z3480 Encounter for supervision of other normal pregnancy, unspecified trimester: Secondary | ICD-10-CM

## 2022-02-21 LAB — GLUCOSE, POCT (MANUAL RESULT ENTRY): POC Glucose: 115 mg/dl — AB (ref 70–99)

## 2022-02-21 NOTE — Progress Notes (Signed)
Pt presents for ROB visit. No concerns at this time.  

## 2022-02-21 NOTE — Progress Notes (Signed)
   PRENATAL VISIT NOTE  Subjective:  Roberta Lutz is a 26 y.o. G2P1001 at [redacted]w[redacted]d being seen today for ongoing prenatal care.  She is currently monitored for the following issues for this low-risk pregnancy and has Pilonidal cyst; Alpha thalassemia silent carrier; Cutaneous pseudolymphoma; Diabetes mellitus (HCC); Polycystic disease, ovaries; Reactive lymphoid hyperplasia; Steroid-induced diabetes (HCC); Supervision of other normal pregnancy, antepartum; and Asthma affecting pregnancy in first trimester on their problem list.  Patient reports no complaints.  Contractions: Not present. Vag. Bleeding: None.  Movement: Present. Denies leaking of fluid.   The following portions of the patient's history were reviewed and updated as appropriate: allergies, current medications, past family history, past medical history, past social history, past surgical history and problem list.   Objective:   Vitals:   02/21/22 0824  BP: 113/72  Pulse: 85  Weight: 225 lb 3.2 oz (102.2 kg)    Fetal Status: Fetal Heart Rate (bpm): 160   Movement: Present     General:  Alert, oriented and cooperative. Patient is in no acute distress.  Skin: Skin is warm and dry. No rash noted.   Cardiovascular: Normal heart rate noted  Respiratory: Normal respiratory effort, no problems with respiration noted  Abdomen: Soft, gravid, appropriate for gestational age.  Pain/Pressure: Present     Pelvic: Cervical exam deferred        Extremities: Normal range of motion.  Edema: None  Mental Status: Normal mood and affect. Normal behavior. Normal judgment and thought content.   Assessment and Plan:  Pregnancy: G2P1001 at [redacted]w[redacted]d 1. Supervision of other normal pregnancy, antepartum - Reports frequent and vigorous fetal movement - Repeating Panorama today due to insufficient fetal DNA on first draw. Discussed possible implications for this. - N/V is well-controlled  2. [redacted] weeks gestation of pregnancy - Continue routine  follow-up - Glucose Tolerance, 2 Hours w/1 Hour; Future  3. Pregnancy with type 2 diabetes mellitus in second trimester - Patient is currently not using any hypoglycemic medication. Discussed the rationale for early screening for blood sugar management. Patient is currently unable to perform early GTT due to employer restrictions. Will plan to spot check blood sugar today. Patient reports having one bowl of cereal for breakfast. Discussed lifestyle modifications that support blood sugar control in pregnancy.  - Glucose Tolerance, 2 Hours w/1 Hour; Future - POCT Glucose (CBG) - 115 mg/dL - WDL for postprandial   4. Obesity affecting pregnancy, antepartum, second trimester - follow-up with MFM scheduled for 03/23/22  5. History of pre-eclampsia in prior pregnancy, currently pregnant - Taking basa daily - Normotensive today  6. Asthma complicating pregnancy, antepartum - Inhaler has relieved any episodes of asthma being triggered. Has utilized x3 since visit to MAU. No obvious triggers at this time. Reinforced indications for seeking immediate medical attention.  7. Alpha thalassemia silent carrier   Preterm labor symptoms and general obstetric precautions including but not limited to vaginal bleeding, contractions, leaking of fluid and fetal movement were reviewed in detail with the patient. Please refer to After Visit Summary for other counseling recommendations.   Return in about 4 weeks (around 03/21/2022) for LOB, IN-PERSON.  Future Appointments  Date Time Provider Department Center  03/23/2022  2:30 PM [facility] NURSE [facility]  03/23/2022  2:45 PM [facility]  03/28/2022  8:35 AM Corlis Hove, NP [facility] None    Corlis Hove, NP

## 2022-02-26 LAB — PANORAMA PRENATAL TEST FULL PANEL:PANORAMA TEST PLUS 5 ADDITIONAL MICRODELETIONS: FETAL FRACTION: 4.1

## 2022-03-12 ENCOUNTER — Encounter: Payer: Medicaid Other | Admitting: Advanced Practice Midwife

## 2022-03-16 DIAGNOSIS — Z419 Encounter for procedure for purposes other than remedying health state, unspecified: Secondary | ICD-10-CM | POA: Diagnosis not present

## 2022-03-22 ENCOUNTER — Encounter: Payer: Self-pay | Admitting: Emergency Medicine

## 2022-03-22 ENCOUNTER — Telehealth: Payer: Self-pay | Admitting: Emergency Medicine

## 2022-03-22 NOTE — Telephone Encounter (Signed)
TC to patient to discuss cold symptoms, safe meds list sent via MyChart.

## 2022-03-23 ENCOUNTER — Ambulatory Visit: Payer: Medicaid Other | Admitting: *Deleted

## 2022-03-23 ENCOUNTER — Other Ambulatory Visit: Payer: Self-pay | Admitting: *Deleted

## 2022-03-23 ENCOUNTER — Ambulatory Visit: Payer: Medicaid Other | Attending: Maternal & Fetal Medicine

## 2022-03-23 VITALS — BP 120/58 | HR 115

## 2022-03-23 DIAGNOSIS — O99891 Other specified diseases and conditions complicating pregnancy: Secondary | ICD-10-CM

## 2022-03-23 DIAGNOSIS — Z3A24 24 weeks gestation of pregnancy: Secondary | ICD-10-CM

## 2022-03-23 DIAGNOSIS — O99212 Obesity complicating pregnancy, second trimester: Secondary | ICD-10-CM

## 2022-03-23 DIAGNOSIS — E669 Obesity, unspecified: Secondary | ICD-10-CM | POA: Diagnosis not present

## 2022-03-23 DIAGNOSIS — Z362 Encounter for other antenatal screening follow-up: Secondary | ICD-10-CM | POA: Diagnosis not present

## 2022-03-23 DIAGNOSIS — Z148 Genetic carrier of other disease: Secondary | ICD-10-CM | POA: Diagnosis not present

## 2022-03-23 DIAGNOSIS — Z3480 Encounter for supervision of other normal pregnancy, unspecified trimester: Secondary | ICD-10-CM

## 2022-03-23 DIAGNOSIS — O09299 Supervision of pregnancy with other poor reproductive or obstetric history, unspecified trimester: Secondary | ICD-10-CM

## 2022-03-24 ENCOUNTER — Inpatient Hospital Stay (HOSPITAL_COMMUNITY)
Admission: AD | Admit: 2022-03-24 | Discharge: 2022-03-30 | DRG: 832 | Disposition: A | Discharge status: Home / Self Care | Payer: Medicaid Other | Attending: Obstetrics and Gynecology | Admitting: Obstetrics and Gynecology

## 2022-03-24 ENCOUNTER — Encounter (HOSPITAL_COMMUNITY): Payer: Self-pay | Admitting: Obstetrics & Gynecology

## 2022-03-24 ENCOUNTER — Inpatient Hospital Stay (HOSPITAL_COMMUNITY): Payer: Medicaid Other

## 2022-03-24 ENCOUNTER — Other Ambulatory Visit: Payer: Self-pay

## 2022-03-24 DIAGNOSIS — Z3480 Encounter for supervision of other normal pregnancy, unspecified trimester: Principal | ICD-10-CM

## 2022-03-24 DIAGNOSIS — J210 Acute bronchiolitis due to respiratory syncytial virus: Secondary | ICD-10-CM | POA: Diagnosis present

## 2022-03-24 DIAGNOSIS — O99512 Diseases of the respiratory system complicating pregnancy, second trimester: Principal | ICD-10-CM | POA: Diagnosis present

## 2022-03-24 DIAGNOSIS — Z20822 Contact with and (suspected) exposure to covid-19: Secondary | ICD-10-CM | POA: Diagnosis present

## 2022-03-24 DIAGNOSIS — O24112 Pre-existing diabetes mellitus, type 2, in pregnancy, second trimester: Secondary | ICD-10-CM | POA: Diagnosis present

## 2022-03-24 DIAGNOSIS — O132 Gestational [pregnancy-induced] hypertension without significant proteinuria, second trimester: Secondary | ICD-10-CM | POA: Diagnosis present

## 2022-03-24 DIAGNOSIS — R059 Cough, unspecified: Secondary | ICD-10-CM | POA: Diagnosis not present

## 2022-03-24 DIAGNOSIS — O0990 Supervision of high risk pregnancy, unspecified, unspecified trimester: Secondary | ICD-10-CM

## 2022-03-24 DIAGNOSIS — O24319 Unspecified pre-existing diabetes mellitus in pregnancy, unspecified trimester: Secondary | ICD-10-CM | POA: Diagnosis present

## 2022-03-24 DIAGNOSIS — R0602 Shortness of breath: Secondary | ICD-10-CM | POA: Diagnosis not present

## 2022-03-24 DIAGNOSIS — O99210 Obesity complicating pregnancy, unspecified trimester: Secondary | ICD-10-CM | POA: Diagnosis present

## 2022-03-24 DIAGNOSIS — O99212 Obesity complicating pregnancy, second trimester: Secondary | ICD-10-CM | POA: Diagnosis present

## 2022-03-24 DIAGNOSIS — O0992 Supervision of high risk pregnancy, unspecified, second trimester: Secondary | ICD-10-CM

## 2022-03-24 DIAGNOSIS — J45909 Unspecified asthma, uncomplicated: Secondary | ICD-10-CM | POA: Diagnosis present

## 2022-03-24 DIAGNOSIS — Z3A24 24 weeks gestation of pregnancy: Secondary | ICD-10-CM

## 2022-03-24 DIAGNOSIS — O368320 Maternal care for abnormalities of the fetal heart rate or rhythm, second trimester, not applicable or unspecified: Secondary | ICD-10-CM | POA: Diagnosis present

## 2022-03-24 DIAGNOSIS — Z148 Genetic carrier of other disease: Secondary | ICD-10-CM

## 2022-03-24 DIAGNOSIS — J45901 Unspecified asthma with (acute) exacerbation: Secondary | ICD-10-CM | POA: Diagnosis present

## 2022-03-24 HISTORY — DX: Unspecified asthma, uncomplicated: J45.909

## 2022-03-24 LAB — RESPIRATORY PANEL BY PCR

## 2022-03-24 LAB — URINALYSIS, ROUTINE W REFLEX MICROSCOPIC
Bacteria, UA: NONE SEEN
Bilirubin Urine: NEGATIVE
Glucose, UA: NEGATIVE mg/dL
Ketones, ur: 20 mg/dL — AB
Leukocytes,Ua: NEGATIVE
Nitrite: NEGATIVE
Protein, ur: >=300 mg/dL — AB
Specific Gravity, Urine: 1.031 — ABNORMAL HIGH (ref 1.005–1.030)
pH: 5 (ref 5.0–8.0)

## 2022-03-24 LAB — CBC WITH DIFFERENTIAL/PLATELET
Abs Immature Granulocytes: 0.05 10*3/uL (ref 0.00–0.07)
Basophils Absolute: 0 10*3/uL (ref 0.0–0.1)
Basophils Relative: 0 %
Eosinophils Absolute: 0 10*3/uL (ref 0.0–0.5)
Eosinophils Relative: 0 %
HCT: 36.6 % (ref 36.0–46.0)
Hemoglobin: 10.9 g/dL — ABNORMAL LOW (ref 12.0–15.0)
Immature Granulocytes: 1 %
Lymphocytes Relative: 13 %
Lymphs Abs: 1.2 10*3/uL (ref 0.7–4.0)
MCH: 25.6 pg — ABNORMAL LOW (ref 26.0–34.0)
MCHC: 29.8 g/dL — ABNORMAL LOW (ref 30.0–36.0)
MCV: 85.9 fL (ref 80.0–100.0)
Monocytes Absolute: 0.9 10*3/uL (ref 0.1–1.0)
Monocytes Relative: 10 %
Neutro Abs: 7.1 10*3/uL (ref 1.7–7.7)
Neutrophils Relative %: 76 %
Platelets: 290 10*3/uL (ref 150–400)
RBC: 4.26 MIL/uL (ref 3.87–5.11)
RDW: 13.2 % (ref 11.5–15.5)
WBC: 9.4 10*3/uL (ref 4.0–10.5)
nRBC: 0 % (ref 0.0–0.2)

## 2022-03-24 LAB — TYPE AND SCREEN
ABO/RH(D): O POS
Antibody Screen: NEGATIVE

## 2022-03-24 LAB — GLUCOSE, CAPILLARY: Glucose-Capillary: 99 mg/dL (ref 70–99)

## 2022-03-24 MED ORDER — GUAIFENESIN 100 MG/5ML PO LIQD
10.0000 mL | ORAL | Status: DC | PRN
  Administered 2022-03-24 – 2022-03-25 (×2): 10 mL via ORAL
  Filled 2022-03-24 (×2): qty 15

## 2022-03-24 MED ORDER — LACTATED RINGERS IV BOLUS
1000.0000 mL | Freq: Once | INTRAVENOUS | Status: AC
  Administered 2022-03-24: 1000 mL via INTRAVENOUS

## 2022-03-24 MED ORDER — ENOXAPARIN SODIUM 40 MG/0.4ML IJ SOSY
40.0000 mg | PREFILLED_SYRINGE | INTRAMUSCULAR | Status: DC
  Administered 2022-03-25 – 2022-03-29 (×5): 40 mg via SUBCUTANEOUS
  Filled 2022-03-24 (×6): qty 0.4

## 2022-03-24 MED ORDER — PRENATAL MULTIVITAMIN CH
1.0000 | ORAL_TABLET | Freq: Every day | ORAL | Status: DC
  Filled 2022-03-24: qty 1

## 2022-03-24 MED ORDER — LACTATED RINGERS IV SOLN: 125.0000 mL/h | INTRAVENOUS | Status: DC

## 2022-03-24 MED ORDER — SODIUM CHLORIDE 0.9% FLUSH
3.0000 mL | Freq: Two times a day (BID) | INTRAVENOUS | Status: DC
  Administered 2022-03-25 – 2022-03-30 (×10): 3 mL via INTRAVENOUS

## 2022-03-24 MED ORDER — SODIUM CHLORIDE 0.9% FLUSH: 3.0000 mL | INTRAVENOUS | Status: DC | PRN

## 2022-03-24 MED ORDER — ACETAMINOPHEN 325 MG PO TABS
650.0000 mg | ORAL_TABLET | ORAL | Status: DC | PRN
  Administered 2022-03-24: 650 mg via ORAL
  Filled 2022-03-24: qty 2

## 2022-03-24 MED ORDER — CALCIUM CARBONATE ANTACID 500 MG PO CHEW: 2.0000 | CHEWABLE_TABLET | ORAL | Status: DC | PRN

## 2022-03-24 MED ORDER — ACETAMINOPHEN 500 MG PO TABS
1000.0000 mg | ORAL_TABLET | Freq: Four times a day (QID) | ORAL | Status: DC | PRN
  Administered 2022-03-24 – 2022-03-25 (×2): 1000 mg via ORAL
  Filled 2022-03-24 (×2): qty 2

## 2022-03-24 MED ORDER — DOCUSATE SODIUM 100 MG PO CAPS
100.0000 mg | ORAL_CAPSULE | Freq: Every day | ORAL | Status: DC
  Administered 2022-03-26 – 2022-03-30 (×4): 100 mg via ORAL
  Filled 2022-03-24 (×5): qty 1

## 2022-03-24 MED ORDER — IPRATROPIUM-ALBUTEROL 0.5-2.5 (3) MG/3ML IN SOLN
3.0000 mL | RESPIRATORY_TRACT | Status: DC | PRN
  Administered 2022-03-24 – 2022-03-25 (×3): 3 mL via RESPIRATORY_TRACT
  Filled 2022-03-24 (×5): qty 3

## 2022-03-24 MED ORDER — PREDNISONE 20 MG PO TABS
40.0000 mg | ORAL_TABLET | Freq: Every day | ORAL | Status: DC
  Administered 2022-03-24: 40 mg via ORAL
  Filled 2022-03-24 (×3): qty 2

## 2022-03-24 MED ORDER — SODIUM CHLORIDE 0.9 % IV SOLN: 250.0000 mL | INTRAVENOUS | Status: DC | PRN

## 2022-03-24 MED ORDER — ZOLPIDEM TARTRATE 5 MG PO TABS
5.0000 mg | ORAL_TABLET | Freq: Every evening | ORAL | Status: DC | PRN
  Administered 2022-03-26 – 2022-03-30 (×4): 5 mg via ORAL
  Filled 2022-03-24 (×4): qty 1

## 2022-03-24 MED ORDER — IPRATROPIUM-ALBUTEROL 0.5-2.5 (3) MG/3ML IN SOLN
3.0000 mL | Freq: Once | RESPIRATORY_TRACT | Status: AC
  Administered 2022-03-24: 3 mL via RESPIRATORY_TRACT
  Filled 2022-03-24: qty 3

## 2022-03-24 NOTE — H&P (Addendum)
OBSTETRIC ADMISSION HISTORY AND PHYSICAL  Roberta Lutz is a 26 y.o. female G2P1001 with IUP at 27w3dpresenting with cough, SOB, and body aches. Reports onset of sx 5 days ago. Her son was sick prior to that and states his testing was all negative. Reports productive cough with yellow/green sputum. Denies fevers but has felt hot and cold since last night. She tried using an inhaler but it hasn't helped. No pregnancy complaints. +FM. MAU evaluation found +RSV and persistent fever, SOB, and fetal tachycardia.   Dating: By 10w [place]--->  Estimated Date of Delivery: 07/11/22  Prenatal History/Complications: - obesity - T2DM - Hx of PEC  Past Medical History: Past Medical History:  Diagnosis Date   Anxiety    Asthma    [place]    Type II   Gestational hypertension 04/06/2020   Headache    PCOS (polycystic ovarian syndrome)    Pilonidal cyst    Reactive lymphoid hyperplasia 04/08/2015   Rosacea     Past Surgical History: Past Surgical History:  Procedure Laterality Date   DRUG INDUCED ENDOSCOPY     Gastritis    Obstetrical History: OB History     Gravida  2   Para  1   Term  1   Preterm      AB      Living  1      SAB      IAB      Ectopic      Multiple  0   Live Births  1           Social History: Social History   Socioeconomic History   Marital status: Single    Spouse name: Not on file   Number of children: Not on file   Years of education: Not on file   Highest education level: Not on file  Occupational History   Not on file  Tobacco Use   Smoking status: Never   Smokeless tobacco: Never  Vaping Use   Vaping Use: Never used  Substance and Sexual Activity   Alcohol use: Not Currently    Comment: 3-4 times per week prior to confirmed pregnancy   Drug use: No   Sexual activity: Not Currently    Partners: Male    Birth control/protection: None  Other Topics Concern   Not on file  Social History  Narrative   Not on file   Social Determinants of Health   Financial Resource Strain: Not on file  Food Insecurity: Not on file  Transportation Needs: Not on file  Physical Activity: Not on file  Stress: Not on file  Social Connections: Not on file    Family History: Family History  Problem Relation Age of Onset   Diabetes Mother    Asthma Mother    ADD / ADHD Brother    Asthma Brother    Diabetes Maternal Grandmother    Arthritis Maternal Grandmother    Cancer Maternal Grandfather     Allergies: No Known Allergies  Medications Prior to Admission  Medication Sig Dispense Refill Last Dose   DICLEGIS 10-10 MG TBEC Take 2 tabs qhs then add 1 tab A.M and midday prn 90 tablet 6 Past Month   Prenatal Vit-Fe Fumarate-FA (PRENATAL MULTIVITAMIN) TABS tablet Take 1 tablet by mouth daily at 12 noon.   03/24/2022   albuterol (VENTOLIN HFA) 108 (90 Base) MCG/ACT inhaler Inhale 2 puffs into the lungs every 6 (six) hours as needed for wheezing  or shortness of breath. 8 g 2 More than a month   aspirin EC 81 MG tablet Take 1 tablet (81 mg total) by mouth daily. Swallow whole. (Patient not taking: Reported on 02/16/2022) 30 tablet 12    Blood Pressure Monitoring (BLOOD PRESSURE KIT) DEVI 1 kit by Does not apply route once a week. (Patient not taking: Reported on 03/23/2022) 1 each 0    ibuprofen (ADVIL) 800 MG tablet Take 1 tablet (800 mg total) by mouth 3 (three) times daily. (Patient not taking: Reported on 12/19/2021) 30 tablet 2    Misc. Devices (GOJJI WEIGHT SCALE) MISC 1 Device by Does not apply route every 30 (thirty) days. (Patient not taking: Reported on 03/23/2022) 1 each 0      Review of Systems:  All systems reviewed and negative except as stated in HPI  PE: Blood pressure 122/69, pulse (!) 133, temperature (!) 100.8 F (38.2 C), temperature source Axillary, resp. rate (!) 28, height 5' 2.5" (1.588 m), weight 101.7 kg, last menstrual period 09/24/2021, SpO2 94 %, not currently  breastfeeding. Patient Vitals for the past 24 hrs:  BP Temp Temp src Pulse Resp SpO2 Height Weight  03/24/22 1928 -- -- -- -- -- 100 % -- --  03/24/22 1817 -- (!) 100.8 F (38.2 C) Axillary -- -- -- -- --  03/24/22 1607 122/69 (!) 102.1 F (38.9 C) Oral (!) 133 (!) 28 94 % -- --  03/24/22 1557 -- -- -- -- -- -- 5' 2.5" (1.588 m) 101.7 kg   General appearance: alert, cooperative, and mild distress Lungs: CTAB, diminished, increased WOB Heart: regular, tachy  Abdomen: soft, non-tender Extremities: Homans sign is negative, no sign of DVT EFM: 180 bpm, mod variability, + accels, no decels Toco: none  Prenatal labs: ABO, Rh: O/Positive/-- (10/09 7517) Antibody: Negative (10/09 0906) Rubella: 1.58 (10/09 0906) RPR: Non Reactive (10/09 0906)  HBsAg: Negative (10/09 0906)  HIV: Non Reactive (10/09 0906)    Prenatal Transfer Tool  Maternal Diabetes: Yes:  Diabetes Type:  Pre-pregnancy Genetic Screening: Normal Maternal Ultrasounds/Referrals: Normal Fetal Ultrasounds or other Referrals:  Referred to Materal Fetal Medicine  Maternal Substance Abuse:  No Significant Maternal Medications:  None Significant Maternal Lab Results: None  Results for orders placed or performed during the hospital encounter of 03/24/22 (from the past 24 hour(s))  CBC with Differential/Platelet   Collection Time: 03/24/22  4:31 PM  Result Value Ref Range   WBC 9.4 4.0 - 10.5 K/uL   RBC 4.26 3.87 - 5.11 MIL/uL   Hemoglobin 10.9 (L) 12.0 - 15.0 g/dL   HCT 36.6 36.0 - 46.0 %   MCV 85.9 80.0 - 100.0 fL   MCH 25.6 (L) 26.0 - 34.0 pg   MCHC 29.8 (L) 30.0 - 36.0 g/dL   RDW 13.2 11.5 - 15.5 %   Platelets 290 150 - 400 K/uL   nRBC 0.0 0.0 - 0.2 %   Neutrophils Relative % 76 %   Neutro Abs 7.1 1.7 - 7.7 K/uL   Lymphocytes Relative 13 %   Lymphs Abs 1.2 0.7 - 4.0 K/uL   Monocytes Relative 10 %   Monocytes Absolute 0.9 0.1 - 1.0 K/uL   Eosinophils Relative 0 %   Eosinophils Absolute 0.0 0.0 - 0.5 K/uL    Basophils Relative 0 %   Basophils Absolute 0.0 0.0 - 0.1 K/uL   Immature Granulocytes 1 %   Abs Immature Granulocytes 0.05 0.00 - 0.07 K/uL  Urinalysis, Routine w reflex microscopic Urine,  Clean Catch   Collection Time: 03/24/22  4:50 PM  Result Value Ref Range   Color, Urine AMBER (A) YELLOW   APPearance CLOUDY (A) CLEAR   Specific Gravity, Urine 1.031 (H) 1.005 - 1.030   pH 5.0 5.0 - 8.0   Glucose, UA NEGATIVE NEGATIVE mg/dL   Hgb urine dipstick SMALL (A) NEGATIVE   Bilirubin Urine NEGATIVE NEGATIVE   Ketones, ur 20 (A) NEGATIVE mg/dL   Protein, ur >=300 (A) NEGATIVE mg/dL   Nitrite NEGATIVE NEGATIVE   Leukocytes,Ua NEGATIVE NEGATIVE   RBC / HPF 6-10 0 - 5 RBC/hpf   WBC, UA 6-10 0 - 5 WBC/hpf   Bacteria, UA NONE SEEN NONE SEEN   Squamous Epithelial / LPF 6-10 0 - 5   Mucus PRESENT    Amorphous Crystal PRESENT   Respiratory (~20 pathogens) panel by PCR   Collection Time: 03/24/22  5:46 PM   Specimen: Nasopharyngeal Swab; Respiratory  Result Value Ref Range   Adenovirus NOT DETECTED NOT DETECTED   Coronavirus 229E NOT DETECTED NOT DETECTED   Coronavirus HKU1 NOT DETECTED NOT DETECTED   Coronavirus NL63 NOT DETECTED NOT DETECTED   Coronavirus OC43 NOT DETECTED NOT DETECTED   Metapneumovirus NOT DETECTED NOT DETECTED   Rhinovirus / Enterovirus NOT DETECTED NOT DETECTED   Influenza A NOT DETECTED NOT DETECTED   Influenza B NOT DETECTED NOT DETECTED   Parainfluenza Virus 1 NOT DETECTED NOT DETECTED   Parainfluenza Virus 2 NOT DETECTED NOT DETECTED   Parainfluenza Virus 3 NOT DETECTED NOT DETECTED   Parainfluenza Virus 4 NOT DETECTED NOT DETECTED   Respiratory Syncytial Virus DETECTED (A) NOT DETECTED   Bordetella pertussis NOT DETECTED NOT DETECTED   Bordetella Parapertussis NOT DETECTED NOT DETECTED   Chlamydophila pneumoniae NOT DETECTED NOT DETECTED   Mycoplasma pneumoniae NOT DETECTED NOT DETECTED    Patient Active Problem List   Diagnosis Date Noted   Preexisting  diabetes complicating pregnancy, antepartum 03/24/2022   Obesity in pregnancy, antepartum 03/24/2022   Asthma affecting pregnancy, antepartum 01/22/2022   Supervision of high-risk pregnancy 12/19/2021   Diabetes mellitus ([place]) 04/07/2020   Alpha thalassemia silent carrier 11/09/2019   Cutaneous pseudolymphoma 11/21/2016   Reactive lymphoid hyperplasia 04/08/2015   Pilonidal cyst 07/03/2011   DG Chest 2 View  Result Date: 03/24/2022 CLINICAL DATA:  Cough, shortness of breath. EXAM: CHEST - 2 VIEW COMPARISON:  Chest radiograph dated January 22, 2022 FINDINGS: The heart size and mediastinal contours are within normal limits. Both lungs are clear. The visualized skeletal structures are unremarkable. IMPRESSION: No active cardiopulmonary disease. Electronically Signed   By: Keane Police D.O.   On: 03/24/2022 18:14    Assessment: [redacted] weeks gestation RSV Fetal tachycardia   Plan: Admit to [facility] per Dr. Tad Moore, CNM  03/24/2022, 7:28 PM   Attestation of Attending Supervision of Advanced Practice Provider (CNM/NP/PA): Evaluation and management procedures were performed by the Advanced Practice Provider under my supervision and collaboration.  I have reviewed the Advanced Practice Provider's note and chart, and I agree with the management and plan. I have also made any necessary editorial changes.  Will admit for observation as patient is visibly having respiratory difficulties and difficulty maintaining her oxygen saturation level. Will give supplemental oxygen via [place] as needed.  Duoneb ordered for now, will order course of steroids for asthma exacerbation also.  Will continue close monitoring.   Verita Schneiders, MD, [place] Attending [place], [facility] for [facility]  Healthcare, [place]

## 2022-03-24 NOTE — MAU Note (Signed)
Roberta Lutz is a 26 y.o. at [redacted]w[redacted]d here in MAU reporting: about a week ago 76 year old son coughed in her mouth, since then she has been sick. Pt has cough, sore throat, and is feeling SOB. Pt reports sometimes she coughs so hard she is vomiting and has been having trouble keeping down food. Has been using an inhaler. No labor s/s. +FM.  Onset of complaint: ongoing  Pain score: sore throat 2/10, chest pain 8/10 Vitals:   03/24/22 1607  BP: 122/69  Pulse: (!) 133  Resp: (!) 28  Temp: (!) 102.1 F (38.9 C)  SpO2: 94%     FHT:188  Lab orders placed from triage: UA

## 2022-03-25 ENCOUNTER — Inpatient Hospital Stay (HOSPITAL_COMMUNITY): Payer: Medicaid Other

## 2022-03-25 ENCOUNTER — Encounter (HOSPITAL_COMMUNITY): Payer: Self-pay | Admitting: Obstetrics & Gynecology

## 2022-03-25 DIAGNOSIS — J45901 Unspecified asthma with (acute) exacerbation: Secondary | ICD-10-CM | POA: Diagnosis not present

## 2022-03-25 DIAGNOSIS — O99519 Diseases of the respiratory system complicating pregnancy, unspecified trimester: Secondary | ICD-10-CM | POA: Diagnosis not present

## 2022-03-25 DIAGNOSIS — J210 Acute bronchiolitis due to respiratory syncytial virus: Secondary | ICD-10-CM | POA: Diagnosis not present

## 2022-03-25 DIAGNOSIS — R0602 Shortness of breath: Secondary | ICD-10-CM | POA: Diagnosis not present

## 2022-03-25 DIAGNOSIS — R059 Cough, unspecified: Secondary | ICD-10-CM | POA: Diagnosis not present

## 2022-03-25 DIAGNOSIS — O132 Gestational [pregnancy-induced] hypertension without significant proteinuria, second trimester: Secondary | ICD-10-CM | POA: Diagnosis not present

## 2022-03-25 DIAGNOSIS — O0992 Supervision of high risk pregnancy, unspecified, second trimester: Secondary | ICD-10-CM | POA: Diagnosis not present

## 2022-03-25 DIAGNOSIS — O99212 Obesity complicating pregnancy, second trimester: Secondary | ICD-10-CM | POA: Diagnosis not present

## 2022-03-25 DIAGNOSIS — O368320 Maternal care for abnormalities of the fetal heart rate or rhythm, second trimester, not applicable or unspecified: Secondary | ICD-10-CM | POA: Diagnosis not present

## 2022-03-25 DIAGNOSIS — R0902 Hypoxemia: Secondary | ICD-10-CM | POA: Diagnosis not present

## 2022-03-25 DIAGNOSIS — Z20822 Contact with and (suspected) exposure to covid-19: Secondary | ICD-10-CM | POA: Diagnosis not present

## 2022-03-25 DIAGNOSIS — O24112 Pre-existing diabetes mellitus, type 2, in pregnancy, second trimester: Secondary | ICD-10-CM | POA: Diagnosis not present

## 2022-03-25 DIAGNOSIS — O24319 Unspecified pre-existing diabetes mellitus in pregnancy, unspecified trimester: Secondary | ICD-10-CM | POA: Diagnosis not present

## 2022-03-25 DIAGNOSIS — Z3A24 24 weeks gestation of pregnancy: Secondary | ICD-10-CM | POA: Diagnosis not present

## 2022-03-25 DIAGNOSIS — O99512 Diseases of the respiratory system complicating pregnancy, second trimester: Secondary | ICD-10-CM | POA: Diagnosis not present

## 2022-03-25 DIAGNOSIS — Z148 Genetic carrier of other disease: Secondary | ICD-10-CM | POA: Diagnosis not present

## 2022-03-25 LAB — CBC WITH DIFFERENTIAL/PLATELET
Abs Immature Granulocytes: 0.04 10*3/uL (ref 0.00–0.07)
Basophils Absolute: 0 10*3/uL (ref 0.0–0.1)
Basophils Relative: 0 %
Eosinophils Absolute: 0 10*3/uL (ref 0.0–0.5)
Eosinophils Relative: 0 %
HCT: 32.6 % — ABNORMAL LOW (ref 36.0–46.0)
Hemoglobin: 10.5 g/dL — ABNORMAL LOW (ref 12.0–15.0)
Immature Granulocytes: 1 %
Lymphocytes Relative: 10 %
Lymphs Abs: 0.8 10*3/uL (ref 0.7–4.0)
MCH: 25.9 pg — ABNORMAL LOW (ref 26.0–34.0)
MCHC: 32.2 g/dL (ref 30.0–36.0)
MCV: 80.3 fL (ref 80.0–100.0)
Monocytes Absolute: 0.4 10*3/uL (ref 0.1–1.0)
Monocytes Relative: 5 %
Neutro Abs: 7.1 10*3/uL (ref 1.7–7.7)
Neutrophils Relative %: 84 %
Platelets: 310 10*3/uL (ref 150–400)
RBC: 4.06 MIL/uL (ref 3.87–5.11)
RDW: 13.1 % (ref 11.5–15.5)
WBC: 8.4 10*3/uL (ref 4.0–10.5)
nRBC: 0 % (ref 0.0–0.2)

## 2022-03-25 LAB — COMPREHENSIVE METABOLIC PANEL
ALT: 17 U/L (ref 0–44)
AST: 30 U/L (ref 15–41)
Albumin: 2.8 g/dL — ABNORMAL LOW (ref 3.5–5.0)
Alkaline Phosphatase: 63 U/L (ref 38–126)
Anion gap: 14 (ref 5–15)
BUN: 5 mg/dL — ABNORMAL LOW (ref 6–20)
CO2: 19 mmol/L — ABNORMAL LOW (ref 22–32)
Calcium: 8.6 mg/dL — ABNORMAL LOW (ref 8.9–10.3)
Chloride: 104 mmol/L (ref 98–111)
Creatinine, Ser: 0.64 mg/dL (ref 0.44–1.00)
GFR, Estimated: >60 mL/min (ref >60)
Glucose, Bld: 153 mg/dL — ABNORMAL HIGH (ref 70–99)
Potassium: 3.5 mmol/L (ref 3.5–5.1)
Sodium: 137 mmol/L (ref 135–145)
Total Bilirubin: 0.5 mg/dL (ref 0.3–1.2)
Total Protein: 6.9 g/dL (ref 6.5–8.1)

## 2022-03-25 LAB — TROPONIN I (HIGH SENSITIVITY): Troponin I (High Sensitivity): 3 ng/L (ref <18)

## 2022-03-25 LAB — BRAIN NATRIURETIC PEPTIDE: B Natriuretic Peptide: 12.2 pg/mL (ref 0.0–100.0)

## 2022-03-25 LAB — GLUCOSE, CAPILLARY
Glucose-Capillary: 119 mg/dL — ABNORMAL HIGH (ref 70–99)
Glucose-Capillary: 108 mg/dL — ABNORMAL HIGH (ref 70–99)
Glucose-Capillary: 182 mg/dL — ABNORMAL HIGH (ref 70–99)

## 2022-03-25 LAB — D-DIMER, QUANTITATIVE: D-Dimer, Quant: 1.24 ug/mL-FEU — ABNORMAL HIGH (ref 0.00–0.50)

## 2022-03-25 MED ORDER — POTASSIUM CHLORIDE 20 MEQ PO PACK
40.0000 meq | PACK | Freq: Once | ORAL | Status: AC
  Administered 2022-03-25: 40 meq via ORAL
  Filled 2022-03-25: qty 2

## 2022-03-25 MED ORDER — FLUTICASONE FUROATE-VILANTEROL 100-25 MCG/ACT IN AEPB
1.0000 | INHALATION_SPRAY | Freq: Every day | RESPIRATORY_TRACT | Status: DC
  Administered 2022-03-25 – 2022-03-30 (×6): 1 via RESPIRATORY_TRACT
  Filled 2022-03-25: qty 28

## 2022-03-25 MED ORDER — IPRATROPIUM-ALBUTEROL 0.5-2.5 (3) MG/3ML IN SOLN
3.0000 mL | Freq: Four times a day (QID) | RESPIRATORY_TRACT | Status: DC
  Administered 2022-03-25: 3 mL via RESPIRATORY_TRACT
  Filled 2022-03-25 (×4): qty 3

## 2022-03-25 MED ORDER — IPRATROPIUM-ALBUTEROL 0.5-2.5 (3) MG/3ML IN SOLN
3.0000 mL | RESPIRATORY_TRACT | Status: DC | PRN
  Administered 2022-03-25 – 2022-03-26 (×4): 3 mL via RESPIRATORY_TRACT
  Filled 2022-03-25 (×6): qty 3

## 2022-03-25 MED ORDER — METHYLPREDNISOLONE SODIUM SUCC 125 MG IJ SOLR
40.0000 mg | INTRAMUSCULAR | Status: DC
  Administered 2022-03-26 – 2022-03-28 (×3): 40 mg via INTRAVENOUS
  Filled 2022-03-25 (×3): qty 2

## 2022-03-25 MED ORDER — METHYLPREDNISOLONE SODIUM SUCC 125 MG IJ SOLR
125.0000 mg | Freq: Once | INTRAMUSCULAR | Status: AC
  Administered 2022-03-25: 125 mg via INTRAVENOUS
  Filled 2022-03-25: qty 2

## 2022-03-25 MED ORDER — METHYLPREDNISOLONE SODIUM SUCC 125 MG IJ SOLR: 60.0000 mg | Freq: Two times a day (BID) | INTRAMUSCULAR | Status: DC

## 2022-03-25 MED ORDER — IOHEXOL 350 MG/ML SOLN
75.0000 mL | Freq: Once | INTRAVENOUS | Status: AC | PRN
  Administered 2022-03-25: 75 mL via INTRAVENOUS

## 2022-03-25 MED ORDER — COMPLETENATE 29-1 MG PO CHEW
1.0000 | CHEWABLE_TABLET | Freq: Every day | ORAL | Status: DC
  Administered 2022-03-25 – 2022-03-30 (×6): 1 via ORAL
  Filled 2022-03-25 (×6): qty 1

## 2022-03-25 MED ORDER — DIPHENHYDRAMINE HCL 25 MG PO CAPS
25.0000 mg | ORAL_CAPSULE | Freq: Four times a day (QID) | ORAL | Status: AC | PRN
  Administered 2022-03-25: 25 mg via ORAL
  Filled 2022-03-25: qty 1

## 2022-03-25 MED ORDER — FUROSEMIDE 10 MG/ML IJ SOLN
20.0000 mg | Freq: Once | INTRAMUSCULAR | Status: AC
  Administered 2022-03-25: 20 mg via INTRAVENOUS
  Filled 2022-03-25: qty 2

## 2022-03-25 MED ORDER — POTASSIUM CHLORIDE CRYS ER 20 MEQ PO TBCR: 40.0000 meq | EXTENDED_RELEASE_TABLET | Freq: Once | ORAL | Status: DC

## 2022-03-25 MED ORDER — ALUM & MAG HYDROXIDE-SIMETH 200-200-20 MG/5ML PO SUSP
30.0000 mL | Freq: Four times a day (QID) | ORAL | Status: DC | PRN
  Administered 2022-03-25 (×3): 30 mL via ORAL
  Filled 2022-03-25 (×4): qty 30

## 2022-03-25 MED ORDER — IPRATROPIUM-ALBUTEROL 0.5-2.5 (3) MG/3ML IN SOLN
3.0000 mL | RESPIRATORY_TRACT | Status: DC
  Filled 2022-03-25 (×4): qty 3

## 2022-03-25 MED ORDER — PANTOPRAZOLE SODIUM 20 MG PO TBEC
20.0000 mg | DELAYED_RELEASE_TABLET | Freq: Every day | ORAL | Status: DC
  Administered 2022-03-25 – 2022-03-27 (×3): 20 mg via ORAL
  Filled 2022-03-25 (×5): qty 1

## 2022-03-25 NOTE — Consult Note (Addendum)
NAME:  Roberta Lutz, MRN:  [id number], DOB:  [birth year], LOS: 0 ADMISSION DATE:  03/24/2022, CONSULTATION DATE:  03/25/22 REFERRING MD:  Dr. Harolyn Rutherford, CHIEF COMPLAINT:  SOB   History of Present Illness:   26 year old female with prior hx of G2P1 currently 24 weeks, DMT2, gestational HTN, PCOS, anxiety, obesity, and recent diagnosis of asthma in October 2023 who presented on 12/9 with shortness of breath.   Patient denies any childhood asthma or previous lung diagnoses.  Never cigarette smoker, never vaper, but smoked THC prior to this pregnancy to help with her anxiety.  Was seen back in MAU in October with shortness of breath after inhalation exposure with concern for OSA/ OHS, improved after nebs and since has albuterol HFA as needed.    Sick exposure from her child one week ago, however all testing for her child was negative.  Prior to one week ago, patient was in her normal state of health and rarely needed her inhaler.  Over the last week she developed decreased appetite and progressive shortness of breath and cough, not really helped by her inhaler.  On Saturday she woke up feeling worse and presented to MAU, found to be febrile 102.1, room air saturation 94%, RSV +, CXR neg, and with fetal tachycardia.  She was admitted to OB, and started on supplemental O2, duonebs, and steroids.  Pulmonary consulted for further recommendations as patient continues to have SOB and O2 drops to low 90's off oxygen.  She denies any LE edema or calf pain.  Reports coughing up clear phlegm.    Pertinent  Medical History  DMT2, gestational HTN, PCOS, anxiety, asthma  Prior pseudolymphoma in 2017 treated with rituximab (by heme) and then methotrexate (?11/2016 to 06/2017, by derm)  Significant Hospital Events: Including procedures, antibiotic start and stop dates in addition to other pertinent events   12/9 admitted   Interim History / Subjective:   Objective   Blood pressure 123/64, pulse (!) 112,  temperature 98.7 F (37.1 C), resp. rate 18, height 5' 2.5" (1.588 m), weight 101.7 kg, last menstrual period 09/24/2021, SpO2 97 %, not currently breastfeeding.       No intake or output data in the 24 hours ending 03/25/22 1701 Filed Weights   03/24/22 1557  Weight: 101.7 kg   Examination: General:  Pleasant adult female sitting in bed in NAD HEENT: MM pink/moist Neuro: Aox3, MAE CV: rr, no murmur PULM:  intermittent tachypnea, anteriorly clear but posteriorly with scattered rales bibasilar, no wheeze, on 3L [place] GI: soft, +bs, gravid  Extremities: warm/dry, no LE edema, tenderness, or erythema  Skin: no rashes   Resolved Hospital Problem list    Assessment & Plan:   Hypoxic respiratory failure RSV +/- viral pneumonia  Possible Asthma exacerbation- no formal workup [redacted] weeks gestation - no reported or documented O2 saturations below 90%, but reportedly off O2, becomes pale and more symptomatic  Plan - repeat CXR given new posterior rales> mostly atelectasis, slight bibasilar hazy opacities  - cont supplemental, ideally comfortable with sat goal > 92%, but for maternal-fetal health, recommends > 95%. Currently on 3L at 97%, no need for salter HFNC.   - check ddimer, BNP, and trop hs> given pregnancy, high risk for DVT/PE,  If positive will need to consider LE duplex vs discuss imaging options w/OB - lasix 58m x 1 with KCL (K 3.5) - add breo - reduce solumedrol to 470mdaily x 5 days - continue duonebs but change to PRN (  may help with fetal tachycardia) - aggressive incentive spirometer use - no need for abx at this time.   - continue ongoing supportive care - will need follow up in our pulmonary office post-partum for formal testing, PFTs, etc for workup of asthma   Pulmonary will see again 12/11 or sooner if needed.  Remainder per primary.    Best Practice (right click and "Reselect all SmartList Selections" daily)  Per primary  Labs   CBC: Recent Labs  Lab  03/24/22 1631 03/25/22 0522  WBC 9.4 8.4  NEUTROABS 7.1 7.1  HGB 10.9* 10.5*  HCT 36.6 32.6*  MCV 85.9 80.3  PLT 290 836    Basic Metabolic Panel: Recent Labs  Lab 03/25/22 0522  NA 137  K 3.5  CL 104  CO2 19*  GLUCOSE 153*  BUN 5*  CREATININE 0.64  CALCIUM 8.6*   GFR: Estimated Creatinine Clearance: 118.2 mL/min (by C-G formula based on SCr of 0.64 mg/dL). Recent Labs  Lab 03/24/22 1631 03/25/22 0522  WBC 9.4 8.4    Liver Function Tests: Recent Labs  Lab 03/25/22 0522  AST 30  ALT 17  ALKPHOS 63  BILITOT 0.5  PROT 6.9  ALBUMIN 2.8*   No results for input(s): "LIPASE", "AMYLASE" in the last 168 hours. No results for input(s): "AMMONIA" in the last 168 hours.  ABG No results found for: "PHART", "PCO2ART", "PO2ART", "HCO3", "TCO2", "ACIDBASEDEF", "O2SAT"   Coagulation Profile: No results for input(s): "INR", "PROTIME" in the last 168 hours.  Cardiac Enzymes: No results for input(s): "CKTOTAL", "CKMB", "CKMBINDEX", "TROPONINI" in the last 168 hours.  HbA1C: Hgb A1c MFr Bld  Date/Time Value Ref Range Status  01/22/2022 09:06 AM 5.8 (H) 4.8 - 5.6 % Final    Comment:             Prediabetes: 5.7 - 6.4          Diabetes: >6.4          Glycemic control for adults with diabetes: <7.0   12/22/2019 11:03 AM 5.1 4.8 - 5.6 % Final    Comment:             Prediabetes: 5.7 - 6.4          Diabetes: >6.4          Glycemic control for adults with diabetes: <7.0     CBG: Recent Labs  Lab 03/24/22 2107 03/25/22 1117 03/25/22 1447  GLUCAP 99 119* 108*    Review of Systems:   Review of Systems  Constitutional:  Positive for fever and malaise/fatigue. Negative for weight loss.  Respiratory:  Positive for cough, shortness of breath and wheezing. Negative for hemoptysis.        Clear productive sputum  Cardiovascular:  Negative for chest pain and leg swelling.    Past Medical History:  She,  has a past medical history of Anxiety, Asthma, Diabetes  mellitus without complication ([place]), Gestational hypertension (04/06/2020), Headache, PCOS (polycystic ovarian syndrome), Pilonidal cyst, Reactive lymphoid hyperplasia (04/08/2015), and Rosacea.   Surgical History:   Past Surgical History:  Procedure Laterality Date   DRUG INDUCED ENDOSCOPY     Gastritis     Social History:   reports that she has never smoked. She has never been exposed to tobacco smoke. She has never used smokeless tobacco. She reports that she does not currently use alcohol after a past usage of about 2.0 standard drinks of alcohol per week. She reports that she does not use drugs.  Family History:  Her family history includes ADD / ADHD in her brother; Arthritis in her maternal grandmother and mother; Asthma in her brother; Birth defects in her brother; Cancer in her maternal grandfather; Diabetes in her maternal grandmother and mother.   Allergies No Known Allergies   Home Medications  Prior to Admission medications   Medication Sig Start Date End Date Taking? Authorizing Provider  albuterol (VENTOLIN HFA) 108 (90 Base) MCG/ACT inhaler Inhale 2 puffs into the lungs every 6 (six) hours as needed for wheezing or shortness of breath. 01/22/22  Yes Deloria Lair, DO  DICLEGIS 10-10 MG TBEC Take 2 tabs qhs then add 1 tab A.M and midday prn 12/04/21  Yes Constant, Peggy, MD  Prenatal Vit-Fe Fumarate-FA (PRENATAL MULTIVITAMIN) TABS tablet Take 1 tablet by mouth daily at 12 noon.   Yes [provider]  aspirin EC 81 MG tablet Take 1 tablet (81 mg total) by mouth daily. Swallow whole. Patient not taking: Reported on 02/16/2022 01/22/22   Darliss Cheney, MD  Blood Pressure Monitoring (BLOOD PRESSURE KIT) DEVI 1 kit by Does not apply route once a week. Patient not taking: Reported on 03/23/2022 12/19/21   Griffin Basil, MD  ibuprofen (ADVIL) 800 MG tablet Take 1 tablet (800 mg total) by mouth 3 (three) times daily. Patient not taking: Reported on 12/19/2021 04/08/20    Griffin Basil, MD  Misc. Devices (GOJJI WEIGHT SCALE) MISC 1 Device by Does not apply route every 30 (thirty) days. Patient not taking: Reported on 03/23/2022 12/19/21   Griffin Basil, MD     Critical care time: n/a     Federico Flake [facility] 03/25/2022, 5:01 PM  See Amion for pager If no response to pager, please call PCCM consult pager After 7:00 pm call Elink

## 2022-03-25 NOTE — Progress Notes (Signed)
Patient ID: Roberta Lutz, female   DOB: [birth year], 28 y.o.   MRN: [id number] [place]) NOTE  Roberta Lutz is a 26 y.o. G2P1001 at [redacted]w[redacted]d by early ultrasound who is admitted for RSV infection with respiratory distress.   Fetal presentation is cephalic. Length of Stay:  0  Days  Subjective: Heartburn cough and congestion Patient reports the fetal movement as active. Patient reports uterine contraction  activity as none. Patient reports  vaginal bleeding as none. Patient describes fluid per vagina as None.  Vitals:  Blood pressure 107/81, pulse (!) 105, temperature 99.2 F (37.3 C), temperature source Oral, resp. rate 18, height 5' 2.5" (1.588 m), weight 101.7 kg, last menstrual period 09/24/2021, SpO2 95 %, not currently breastfeeding. Physical Examination:  General appearance - acyanotic, in no respiratory distress and ill-appearing Heart - normal rate and regular rhythm Abdomen - soft, nontender, nondistended Fundal Height:  size equals dates Extremities: extremities normal, atraumatic, no cyanosis or edema and Homans sign is negative, no sign of DVT Membranes:intact  Fetal Monitoring:  170 reactive no decels  Labs:  Results for orders placed or performed during the hospital encounter of 03/24/22 (from the past 24 hour(s))  CBC with Differential/Platelet   Collection Time: 03/24/22  4:31 PM  Result Value Ref Range   WBC 9.4 4.0 - 10.5 K/uL   RBC 4.26 3.87 - 5.11 MIL/uL   Hemoglobin 10.9 (L) 12.0 - 15.0 g/dL   HCT 36.6 36.0 - 46.0 %   MCV 85.9 80.0 - 100.0 fL   MCH 25.6 (L) 26.0 - 34.0 pg   MCHC 29.8 (L) 30.0 - 36.0 g/dL   RDW 13.2 11.5 - 15.5 %   Platelets 290 150 - 400 K/uL   nRBC 0.0 0.0 - 0.2 %   Neutrophils Relative % 76 %   Neutro Abs 7.1 1.7 - 7.7 K/uL   Lymphocytes Relative 13 %   Lymphs Abs 1.2 0.7 - 4.0 K/uL   Monocytes Relative 10 %   Monocytes Absolute 0.9 0.1 - 1.0 K/uL   Eosinophils Relative 0 %   Eosinophils Absolute 0.0  0.0 - 0.5 K/uL   Basophils Relative 0 %   Basophils Absolute 0.0 0.0 - 0.1 K/uL   Immature Granulocytes 1 %   Abs Immature Granulocytes 0.05 0.00 - 0.07 K/uL  Type and screen [place]   Collection Time: 03/24/22  4:31 PM  Result Value Ref Range   ABO/RH(D) O POS    Antibody Screen NEG    Sample Expiration      03/27/2022,2359 Performed at [facility], [address], [place], [place] [address]   Urinalysis, Routine w reflex microscopic Urine, Clean Catch   Collection Time: 03/24/22  4:50 PM  Result Value Ref Range   Color, Urine AMBER (A) YELLOW   APPearance CLOUDY (A) CLEAR   Specific Gravity, Urine 1.031 (H) 1.005 - 1.030   pH 5.0 5.0 - 8.0   Glucose, UA NEGATIVE NEGATIVE mg/dL   Hgb urine dipstick SMALL (A) NEGATIVE   Bilirubin Urine NEGATIVE NEGATIVE   Ketones, ur 20 (A) NEGATIVE mg/dL   Protein, ur >=300 (A) NEGATIVE mg/dL   Nitrite NEGATIVE NEGATIVE   Leukocytes,Ua NEGATIVE NEGATIVE   RBC / HPF 6-10 0 - 5 RBC/hpf   WBC, UA 6-10 0 - 5 WBC/hpf   Bacteria, UA NONE SEEN NONE SEEN   Squamous Epithelial / LPF 6-10 0 - 5   Mucus PRESENT    Amorphous Crystal PRESENT  Respiratory (~20 pathogens) panel by PCR   Collection Time: 03/24/22  5:46 PM   Specimen: Nasopharyngeal Swab; Respiratory  Result Value Ref Range   Adenovirus NOT DETECTED NOT DETECTED   Coronavirus 229E NOT DETECTED NOT DETECTED   Coronavirus HKU1 NOT DETECTED NOT DETECTED   Coronavirus NL63 NOT DETECTED NOT DETECTED   Coronavirus OC43 NOT DETECTED NOT DETECTED   Metapneumovirus NOT DETECTED NOT DETECTED   Rhinovirus / Enterovirus NOT DETECTED NOT DETECTED   Influenza A NOT DETECTED NOT DETECTED   Influenza B NOT DETECTED NOT DETECTED   Parainfluenza Virus 1 NOT DETECTED NOT DETECTED   Parainfluenza Virus 2 NOT DETECTED NOT DETECTED   Parainfluenza Virus 3 NOT DETECTED NOT DETECTED   Parainfluenza Virus 4 NOT DETECTED NOT DETECTED   Respiratory Syncytial Virus DETECTED (A)  NOT DETECTED   Bordetella pertussis NOT DETECTED NOT DETECTED   Bordetella Parapertussis NOT DETECTED NOT DETECTED   Chlamydophila pneumoniae NOT DETECTED NOT DETECTED   Mycoplasma pneumoniae NOT DETECTED NOT DETECTED  Glucose, capillary   Collection Time: 03/24/22  9:07 PM  Result Value Ref Range   Glucose-Capillary 99 70 - 99 mg/dL  Comprehensive metabolic panel   Collection Time: 03/25/22  5:22 AM  Result Value Ref Range   Sodium 137 135 - 145 mmol/L   Potassium 3.5 3.5 - 5.1 mmol/L   Chloride 104 98 - 111 mmol/L   CO2 19 (L) 22 - 32 mmol/L   Glucose, Bld 153 (H) 70 - 99 mg/dL   BUN 5 (L) 6 - 20 mg/dL   Creatinine, Ser 0.64 0.44 - 1.00 mg/dL   Calcium 8.6 (L) 8.9 - 10.3 mg/dL   Total Protein 6.9 6.5 - 8.1 g/dL   Albumin 2.8 (L) 3.5 - 5.0 g/dL   AST 30 15 - 41 U/L   ALT 17 0 - 44 U/L   Alkaline Phosphatase 63 38 - 126 U/L   Total Bilirubin 0.5 0.3 - 1.2 mg/dL   GFR, Estimated >60 >60 mL/min   Anion gap 14 5 - 15  CBC with Differential/Platelet   Collection Time: 03/25/22  5:22 AM  Result Value Ref Range   WBC 8.4 4.0 - 10.5 K/uL   RBC 4.06 3.87 - 5.11 MIL/uL   Hemoglobin 10.5 (L) 12.0 - 15.0 g/dL   HCT 32.6 (L) 36.0 - 46.0 %   MCV 80.3 80.0 - 100.0 fL   MCH 25.9 (L) 26.0 - 34.0 pg   MCHC 32.2 30.0 - 36.0 g/dL   RDW 13.1 11.5 - 15.5 %   Platelets 310 150 - 400 K/uL   nRBC 0.0 0.0 - 0.2 %   Neutrophils Relative % 84 %   Neutro Abs 7.1 1.7 - 7.7 K/uL   Lymphocytes Relative 10 %   Lymphs Abs 0.8 0.7 - 4.0 K/uL   Monocytes Relative 5 %   Monocytes Absolute 0.4 0.1 - 1.0 K/uL   Eosinophils Relative 0 %   Eosinophils Absolute 0.0 0.0 - 0.5 K/uL   Basophils Relative 0 %   Basophils Absolute 0.0 0.0 - 0.1 K/uL   Immature Granulocytes 1 %   Abs Immature Granulocytes 0.04 0.00 - 0.07 K/uL     Medications:  Scheduled  docusate sodium  100 mg Oral Daily   enoxaparin (LOVENOX) injection  40 mg Subcutaneous Q24H   pantoprazole  20 mg Oral Daily   predniSONE  40 mg Oral  Q breakfast   prenatal multivitamin  1 tablet Oral Q1200   sodium chloride  flush  3 mL Intravenous Q12H   I have reviewed the patient's current medications.  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 Will give supplemental oxygen via [place] as needed. Duoneb ordered for now, will order course of steroids for asthma exacerbation also. Will continue close monitoring.   Scheryl Darter 03/25/2022,9:33 AM

## 2022-03-25 NOTE — Plan of Care (Signed)
Patient has some decrease in SOB post nebulizer treatments. Lungs sound tight,diminished breath sounds. Fever has been so far controlled with Tylenol.

## 2022-03-25 NOTE — Progress Notes (Signed)
Received call from RN taking care of patient, she is having to increase the oxygen via [place] to 4 L to maintain sats above 95%.  Blood pressure 104/72, pulse (!) 121, temperature 99.6 F (37.6 C), temperature source Oral, resp. rate 18, height 5' 2.5" (1.588 m), weight 101.7 kg, last menstrual period 09/24/2021, SpO2 95 %, not currently breastfeeding. Unchanged lung examination from admission, crackles auscultated on both bases.  Had negative CXR yesterday, WBC today is 8.4. FHR notable for fetal tachycardia to 170s.  Talked to Dr. Madelyn Flavors [facility]) about patient, he recommended -- Change steroids from oral Prednisone to Solu-Medrol 125 mg IV x 1 followed by 60 mg IV q12h -- Duonebs should be continued but make this standing QID then prn -- Respiratory Therapy consult should be done and follow recommendations -- Continue close monitoring Appreciate recommendations by Dr. Katrinka Blazing.   Jaynie Collins, MD, FACOG Obstetrician & Gynecologist, [facility] for [employer], [facility]

## 2022-03-25 NOTE — Progress Notes (Signed)
According to RT, it is still difficult for patient to maintain O2 sats above  >95% after treatments.  High flow [place] recommended, this was ordered.  They also recommended Pulmonary consultation; this was done.  Will continue to monitor patient closely and follow up with Pulmonary/RT recommendations. Appreciate their expertise and care of this patient.   Jaynie Collins, MD, FACOG Obstetrician & Gynecologist, [facility] for [employer], [facility]

## 2022-03-25 NOTE — Progress Notes (Signed)
Patient able to tolerate O2 at 3L/min, maintaining saturation of 96%

## 2022-03-25 NOTE — Progress Notes (Signed)
RN rounding on patient. Patient SOB and complaining of chest tightness. Respirations 24. Patient O2 saturation at 91% on 2 L. Notified MD and increased O2 to 3 Liters; O2 maintained at 93%; increased to 4L, O2 maintaining at 96% with decreased work of breathing. Dr. Macon Large aware.

## 2022-03-26 DIAGNOSIS — J210 Acute bronchiolitis due to respiratory syncytial virus: Secondary | ICD-10-CM | POA: Diagnosis not present

## 2022-03-26 LAB — GLUCOSE, CAPILLARY
Glucose-Capillary: 108 mg/dL — ABNORMAL HIGH (ref 70–99)
Glucose-Capillary: 123 mg/dL — ABNORMAL HIGH (ref 70–99)
Glucose-Capillary: 154 mg/dL — ABNORMAL HIGH (ref 70–99)
Glucose-Capillary: 169 mg/dL — ABNORMAL HIGH (ref 70–99)

## 2022-03-26 LAB — CBC
HCT: 32.9 % — ABNORMAL LOW (ref 36.0–46.0)
Hemoglobin: 10.5 g/dL — ABNORMAL LOW (ref 12.0–15.0)
MCH: 25.4 pg — ABNORMAL LOW (ref 26.0–34.0)
MCHC: 31.9 g/dL (ref 30.0–36.0)
MCV: 79.5 fL — ABNORMAL LOW (ref 80.0–100.0)
Platelets: 338 10*3/uL (ref 150–400)
RBC: 4.14 MIL/uL (ref 3.87–5.11)
RDW: 13.1 % (ref 11.5–15.5)
WBC: 12 10*3/uL — ABNORMAL HIGH (ref 4.0–10.5)
nRBC: 0 % (ref 0.0–0.2)

## 2022-03-26 LAB — BASIC METABOLIC PANEL
Anion gap: 12 (ref 5–15)
BUN: 5 mg/dL — ABNORMAL LOW (ref 6–20)
CO2: 20 mmol/L — ABNORMAL LOW (ref 22–32)
Calcium: 8.6 mg/dL — ABNORMAL LOW (ref 8.9–10.3)
Chloride: 103 mmol/L (ref 98–111)
Creatinine, Ser: 0.73 mg/dL (ref 0.44–1.00)
GFR, Estimated: >60 mL/min (ref >60)
Glucose, Bld: 103 mg/dL — ABNORMAL HIGH (ref 70–99)
Potassium: 3.5 mmol/L (ref 3.5–5.1)
Sodium: 135 mmol/L (ref 135–145)

## 2022-03-26 LAB — MAGNESIUM: Magnesium: 2.1 mg/dL (ref 1.7–2.4)

## 2022-03-26 LAB — HEMOGLOBIN A1C
Hgb A1c MFr Bld: 6.4 % — ABNORMAL HIGH (ref 4.8–5.6)
Mean Plasma Glucose: 137 mg/dL

## 2022-03-26 NOTE — Consult Note (Signed)
NAME:  Roberta Lutz, MRN:  [id number], DOB:  [birth year], LOS: 1 ADMISSION DATE:  03/24/2022, CONSULTATION DATE:  03/25/22 REFERRING MD:  Dr. Macon Large, CHIEF COMPLAINT:  SOB   History of Present Illness:  26 year old female with prior hx of G2P1 currently 24 weeks, DMT2, gestational HTN, PCOS, anxiety, obesity, and recent diagnosis of asthma in October 2023 who presented on 12/9 with shortness of breath.   Patient denies any childhood asthma or previous lung diagnoses.  Never cigarette smoker, never vaper, but smoked THC prior to this pregnancy to help with her anxiety.  Was seen back in MAU in October with shortness of breath after inhalation exposure with concern for OSA/ OHS, improved after nebs and since has albuterol HFA as needed.    Sick exposure from her child one week ago, however all testing for her child was negative.  Prior to one week ago, patient was in her normal state of health and rarely needed her inhaler.  Over the last week she developed decreased appetite and progressive shortness of breath and cough, not really helped by her inhaler.  On Saturday she woke up feeling worse and presented to MAU, found to be febrile 102.1, room air saturation 94%, RSV +, CXR neg, and with fetal tachycardia.  She was admitted to OB, and started on supplemental O2, duonebs, and steroids.  Pulmonary consulted for further recommendations as patient continues to have SOB and O2 drops to low 90's off oxygen.  She denies any LE edema or calf pain.  Reports coughing up clear phlegm.    Pertinent  Medical History  DMT2, gestational HTN, PCOS, anxiety, asthma  Prior pseudolymphoma in 2017 treated with rituximab (by heme) and then methotrexate (?11/2016 to 06/2017, by derm)  Significant Hospital Events: Including procedures, antibiotic start and stop dates in addition to other pertinent events   12/9 Admit with SOB   Interim History / Subjective:  Pt reports change in sputum color to green  Afebrile   Reports feels better after being able to rest on her stomach  RN reports pt on 15L [place] > transitioned to simple face mask due to nasal dryness   Objective   Blood pressure 111/71, pulse (!) 107, temperature 98.5 F (36.9 C), temperature source Oral, resp. rate (!) 22, height 5' 2.5" (1.588 m), weight 101.7 kg, last menstrual period 09/24/2021, SpO2 95 %, not currently breastfeeding.       No intake or output data in the 24 hours ending 03/26/22 1434 Filed Weights   03/24/22 1557  Weight: 101.7 kg   Examination: General: young adult female sitting up in chair in NAD    HEENT: MM pink/moist, face mask in place, anicteric, good dentition  Neuro: AAOx4, speech clear, MAE  CV: s1s2 RRR, no m/r/g PULM: non-labored at rest, lungs bilaterally with coarse crackles  GI: soft, bsx4 active, gravid uterus, toco monitoring in place with fetal HR 150's Extremities: warm/dry, no edema  Skin: no rashes or lesions  Resolved Hospital Problem list    Assessment & Plan:   Acute Hypoxic Respiratory Failure RSV +/- Viral Pneumonia  Possible Asthma Exacerbation - no formal workup [redacted] weeks gestation RSV positive.  Ongoing O2 needs.  PE ruled out. CTA chest negative for PE but shows diffuse bilateral patchy infiltrates concerning for possible viral PNA vs pneumonitis.  Additional findings of airway thickening / chronic bronchitis type changes.   -O2 to support sats >95% -pulmonary hygiene -IS, mobilize, prone positioning  -droplet precautions  -continue  Breo, duoneb PRN -solumedrol 40 mg IV Q24 x5 days -PPI  -will need outpatient work up for asthma post-pregnancy  -send tracheal aspirate   -if new fever or persistent sputum with increased volume, consider empiric abx for possible post viral, bacterial PNA   Phone numbers provided for staff, written on board for communication at night.   Pulmonary will see again 12/12, call sooner if needed.  Remainder per primary.    Best Practice (right  click and "Reselect all SmartList Selections" daily)  Per primary  Critical care time: n/a    Canary Brim, MSN, APRN, NP-C, AGACNP-BC [facility] 03/26/2022, 2:48 PM   Please see [email or web address] for pager details.   From 7A-7P if no response, please call [phone] After hours, please call ELink [phone]

## 2022-03-26 NOTE — Progress Notes (Signed)
[place] COMPREHENSIVE PROGRESS NOTE  Roberta Lutz is a 26 y.o. G2P1001 at [redacted]w[redacted]d who is admitted for RSV pneumonia.  Estimated Date of Delivery: 07/11/22 Fetal presentation is  variable .  Length of Stay:  1 Days. Admitted 03/24/2022  Subjective: She feels better in prone position. She reports she is coughing up sputum.   Patient reports good fetal movement.  She reports no uterine contractions, no bleeding and no loss of fluid per vagina.  Vitals:  Blood pressure (!) 115/57, pulse (!) 115, temperature 99.1 F (37.3 C), temperature source Oral, resp. rate (!) 22, height 5' 2.5" (1.588 m), weight 101.7 kg, last menstrual period 09/24/2021, SpO2 100 %, not currently breastfeeding. Physical Examination: CONSTITUTIONAL: Well-developed, well-nourished female in no acute distress in prone position.  NEUROLOGIC: Alert and oriented to person, place, and time. No cranial nerve deficit noted. PSYCHIATRIC: Normal mood and affect. Normal behavior. Normal judgment and thought content. CARDIOVASCULAR: Normal heart rate noted, regular rhythm RESPIRATORY: No excessive work of breathing, appears to be making normal respiratory effort.  MUSCULOSKELETAL: Normal range of motion. No edema and no tenderness. 2+ distal pulses. ABDOMEN: Soft, nontender, nondistended, gravid.   Fetal monitoring: FHR: 160 bpm, Variability: moderate, Accelerations: Present, Decelerations: Absent - appropriate for gestational age Uterine activity: No contractions  Results for orders placed or performed during the hospital encounter of 03/24/22 (from the past 48 hour(s))  CBC with Differential/Platelet     Status: Abnormal   Collection Time: 03/24/22  4:31 PM  Result Value Ref Range   WBC 9.4 4.0 - 10.5 K/uL   RBC 4.26 3.87 - 5.11 MIL/uL   Hemoglobin 10.9 (L) 12.0 - 15.0 g/dL   HCT 36.6 36.0 - 46.0 %   MCV 85.9 80.0 - 100.0 fL   MCH 25.6 (L) 26.0 - 34.0 pg   MCHC 29.8 (L) 30.0 - 36.0 g/dL   RDW 13.2 11.5  - 15.5 %   Platelets 290 150 - 400 K/uL   nRBC 0.0 0.0 - 0.2 %   Neutrophils Relative % 76 %   Neutro Abs 7.1 1.7 - 7.7 K/uL   Lymphocytes Relative 13 %   Lymphs Abs 1.2 0.7 - 4.0 K/uL   Monocytes Relative 10 %   Monocytes Absolute 0.9 0.1 - 1.0 K/uL   Eosinophils Relative 0 %   Eosinophils Absolute 0.0 0.0 - 0.5 K/uL   Basophils Relative 0 %   Basophils Absolute 0.0 0.0 - 0.1 K/uL   Immature Granulocytes 1 %   Abs Immature Granulocytes 0.05 0.00 - 0.07 K/uL    Comment: Performed at [facility], [address], [place], [place] [address]  Type and screen [place]     Status: None   Collection Time: 03/24/22  4:31 PM  Result Value Ref Range   ABO/RH(D) O POS    Antibody Screen NEG    Sample Expiration      03/27/2022,2359 Performed at [facility], [place] [address] [place], [place] [address]   Urinalysis, Routine w reflex microscopic Urine, Clean Catch     Status: Abnormal   Collection Time: 03/24/22  4:50 PM  Result Value Ref Range   Color, Urine AMBER (A) YELLOW    Comment: BIOCHEMICALS MAY BE AFFECTED BY COLOR   APPearance CLOUDY (A) CLEAR   Specific Gravity, Urine 1.031 (H) 1.005 - 1.030   pH 5.0 5.0 - 8.0   Glucose, UA NEGATIVE NEGATIVE mg/dL   Hgb urine dipstick SMALL (A)  NEGATIVE   Bilirubin Urine NEGATIVE NEGATIVE   Ketones, ur 20 (A) NEGATIVE mg/dL   Protein, ur >=300 (A) NEGATIVE mg/dL   Nitrite NEGATIVE NEGATIVE   Leukocytes,Ua NEGATIVE NEGATIVE   RBC / HPF 6-10 0 - 5 RBC/hpf   WBC, UA 6-10 0 - 5 WBC/hpf   Bacteria, UA NONE SEEN NONE SEEN   Squamous Epithelial / LPF 6-10 0 - 5   Mucus PRESENT    Amorphous Crystal PRESENT     Comment: Performed at [facility], [place] [address] [place], [place] [address]  Respiratory (~20 pathogens) panel by PCR     Status: Abnormal   Collection Time: 03/24/22  5:46 PM   Specimen: Nasopharyngeal Swab; Respiratory  Result Value Ref Range   Adenovirus NOT DETECTED NOT DETECTED    Coronavirus 229E NOT DETECTED NOT DETECTED    Comment: (NOTE) The Coronavirus on the Respiratory Panel, DOES NOT test for the novel  Coronavirus (2019 nCoV)    Coronavirus HKU1 NOT DETECTED NOT DETECTED   Coronavirus NL63 NOT DETECTED NOT DETECTED   Coronavirus OC43 NOT DETECTED NOT DETECTED   Metapneumovirus NOT DETECTED NOT DETECTED   Rhinovirus / Enterovirus NOT DETECTED NOT DETECTED   Influenza A NOT DETECTED NOT DETECTED   Influenza B NOT DETECTED NOT DETECTED   Parainfluenza Virus 1 NOT DETECTED NOT DETECTED   Parainfluenza Virus 2 NOT DETECTED NOT DETECTED   Parainfluenza Virus 3 NOT DETECTED NOT DETECTED   Parainfluenza Virus 4 NOT DETECTED NOT DETECTED   Respiratory Syncytial Virus DETECTED (A) NOT DETECTED   Bordetella pertussis NOT DETECTED NOT DETECTED   Bordetella Parapertussis NOT DETECTED NOT DETECTED   Chlamydophila pneumoniae NOT DETECTED NOT DETECTED   Mycoplasma pneumoniae NOT DETECTED NOT DETECTED    Comment: Performed at [facility], [place] [address] [place], [place] [address]  Glucose, capillary     Status: None   Collection Time: 03/24/22  9:07 PM  Result Value Ref Range   Glucose-Capillary 99 70 - 99 mg/dL    Comment: Glucose reference range applies only to samples taken after fasting for at least 8 hours.  Hemoglobin A1c     Status: Abnormal   Collection Time: 03/25/22  5:22 AM  Result Value Ref Range   Hgb A1c MFr Bld 6.4 (H) 4.8 - 5.6 %    Comment: (NOTE)         Prediabetes: 5.7 - 6.4         Diabetes: >6.4         Glycemic control for adults with diabetes: <7.0    Mean Plasma Glucose 137 mg/dL    Comment: (NOTE) Performed At: [facility] [place], [place] [id number] Rush Farmer MD [id number]   Comprehensive metabolic panel     Status: Abnormal   Collection Time: 03/25/22  5:22 AM  Result Value Ref Range   Sodium 137 135 - 145 mmol/L   Potassium 3.5 3.5 - 5.1 mmol/L   Chloride 104 98 - 111 mmol/L   CO2 19  (L) 22 - 32 mmol/L   Glucose, Bld 153 (H) 70 - 99 mg/dL    Comment: Glucose reference range applies only to samples taken after fasting for at least 8 hours.   BUN 5 (L) 6 - 20 mg/dL   Creatinine, Ser 0.64 0.44 - 1.00 mg/dL   Calcium 8.6 (L) 8.9 - 10.3 mg/dL   Total Protein 6.9 6.5 - 8.1 g/dL   Albumin 2.8 (L) 3.5 -  5.0 g/dL   AST 30 15 - 41 U/L   ALT 17 0 - 44 U/L   Alkaline Phosphatase 63 38 - 126 U/L   Total Bilirubin 0.5 0.3 - 1.2 mg/dL   GFR, Estimated >60 >60 mL/min    Comment: (NOTE) Calculated using the CKD-EPI Creatinine Equation (2021)    Anion gap 14 5 - 15    Comment: Performed at [facility] [address] [place], [place] [address]  CBC with Differential/Platelet     Status: Abnormal   Collection Time: 03/25/22  5:22 AM  Result Value Ref Range   WBC 8.4 4.0 - 10.5 K/uL   RBC 4.06 3.87 - 5.11 MIL/uL   Hemoglobin 10.5 (L) 12.0 - 15.0 g/dL   HCT 32.6 (L) 36.0 - 46.0 %   MCV 80.3 80.0 - 100.0 fL   MCH 25.9 (L) 26.0 - 34.0 pg   MCHC 32.2 30.0 - 36.0 g/dL   RDW 13.1 11.5 - 15.5 %   Platelets 310 150 - 400 K/uL   nRBC 0.0 0.0 - 0.2 %   Neutrophils Relative % 84 %   Neutro Abs 7.1 1.7 - 7.7 K/uL   Lymphocytes Relative 10 %   Lymphs Abs 0.8 0.7 - 4.0 K/uL   Monocytes Relative 5 %   Monocytes Absolute 0.4 0.1 - 1.0 K/uL   Eosinophils Relative 0 %   Eosinophils Absolute 0.0 0.0 - 0.5 K/uL   Basophils Relative 0 %   Basophils Absolute 0.0 0.0 - 0.1 K/uL   Immature Granulocytes 1 %   Abs Immature Granulocytes 0.04 0.00 - 0.07 K/uL    Comment: Performed at [facility] [address], [place], [place] [address]  Glucose, capillary     Status: Abnormal   Collection Time: 03/25/22 11:17 AM  Result Value Ref Range   Glucose-Capillary 119 (H) 70 - 99 mg/dL    Comment: Glucose reference range applies only to samples taken after fasting for at least 8 hours.  Glucose, capillary     Status: Abnormal   Collection Time: 03/25/22  2:47 PM  Result Value Ref  Range   Glucose-Capillary 108 (H) 70 - 99 mg/dL    Comment: Glucose reference range applies only to samples taken after fasting for at least 8 hours.  D-dimer, quantitative     Status: Abnormal   Collection Time: 03/25/22  5:26 PM  Result Value Ref Range   D-Dimer, Quant 1.24 (H) 0.00 - 0.50 ug/mL-FEU    Comment: (NOTE) At the manufacturer cut-off value of 0.5 g/mL FEU, this assay has a negative predictive value of 95-100%.This assay is intended for use in conjunction with a clinical pretest probability (PTP) assessment model to exclude pulmonary embolism (PE) and deep venous thrombosis (DVT) in outpatients suspected of PE or DVT. Results should be correlated with clinical presentation. Performed at [facility], [place] [address] [place], [place] [address]   Brain natriuretic peptide     Status: None   Collection Time: 03/25/22  5:26 PM  Result Value Ref Range   B Natriuretic Peptide 12.2 0.0 - 100.0 pg/mL    Comment: Performed at [facility] [address], [place], [place] [address]  Troponin I (High Sensitivity)     Status: None   Collection Time: 03/25/22  5:26 PM  Result Value Ref Range   Troponin I (High Sensitivity) 3 <18 ng/L    Comment: (NOTE) Elevated high sensitivity troponin I (hsTnI) values and  significant  changes across serial measurements may suggest ACS but many other  chronic and acute conditions are known to elevate hsTnI results.  Refer to the "Links" section for chest pain algorithms and additional  guidance. Performed at [facility], [place] [address] [place], [place] [address]   Glucose, capillary     Status: Abnormal   Collection Time: 03/25/22  5:57 PM  Result Value Ref Range   Glucose-Capillary 182 (H) 70 - 99 mg/dL    Comment: Glucose reference range applies only to samples taken after fasting for at least 8 hours.  Glucose, capillary     Status: Abnormal   Collection Time: 03/25/22 11:58 PM  Result Value Ref Range    Glucose-Capillary 123 (H) 70 - 99 mg/dL    Comment: Glucose reference range applies only to samples taken after fasting for at least 8 hours.  CBC     Status: Abnormal   Collection Time: 03/26/22  4:25 AM  Result Value Ref Range   WBC 12.0 (H) 4.0 - 10.5 K/uL   RBC 4.14 3.87 - 5.11 MIL/uL   Hemoglobin 10.5 (L) 12.0 - 15.0 g/dL   HCT 32.9 (L) 36.0 - 46.0 %   MCV 79.5 (L) 80.0 - 100.0 fL   MCH 25.4 (L) 26.0 - 34.0 pg   MCHC 31.9 30.0 - 36.0 g/dL   RDW 13.1 11.5 - 15.5 %   Platelets 338 150 - 400 K/uL   nRBC 0.0 0.0 - 0.2 %    Comment: Performed at [facility], [place] [address] [place], [place] [id number]  Basic metabolic panel     Status: Abnormal   Collection Time: 03/26/22  4:25 AM  Result Value Ref Range   Sodium 135 135 - 145 mmol/L   Potassium 3.5 3.5 - 5.1 mmol/L   Chloride 103 98 - 111 mmol/L   CO2 20 (L) 22 - 32 mmol/L   Glucose, Bld 103 (H) 70 - 99 mg/dL    Comment: Glucose reference range applies only to samples taken after fasting for at least 8 hours.   BUN 5 (L) 6 - 20 mg/dL   Creatinine, Ser 0.73 0.44 - 1.00 mg/dL   Calcium 8.6 (L) 8.9 - 10.3 mg/dL   GFR, Estimated >60 >60 mL/min    Comment: (NOTE) Calculated using the CKD-EPI Creatinine Equation (2021)    Anion gap 12 5 - 15    Comment: Performed at [facility] [address], [place], [place] [address]  Magnesium     Status: None   Collection Time: 03/26/22  4:25 AM  Result Value Ref Range   Magnesium 2.1 1.7 - 2.4 mg/dL    Comment: Performed at [facility] [address], [place], [place] [address]  Glucose, capillary     Status: Abnormal   Collection Time: 03/26/22  5:58 AM  Result Value Ref Range   Glucose-Capillary 108 (H) 70 - 99 mg/dL    Comment: Glucose reference range applies only to samples taken after fasting for at least 8 hours.    CT Angio Chest Pulmonary Embolism (PE) W or WO Contrast  Result Date: 03/26/2022 CLINICAL DATA:  Shortness of breath, pregnancy with RSV,  initial encounter EXAM: CT ANGIOGRAPHY CHEST WITH CONTRAST TECHNIQUE: Multidetector CT imaging of the chest was performed using the standard protocol during bolus administration of intravenous contrast. Multiplanar CT image reconstructions and MIPs were obtained to evaluate the vascular anatomy. RADIATION DOSE REDUCTION: This exam was  performed according to the departmental dose-optimization program which includes automated exposure control, adjustment of the mA and/or kV according to patient size and/or use of iterative reconstruction technique. CONTRAST:  107mL OMNIPAQUE IOHEXOL 350 MG/ML SOLN COMPARISON:  Chest x-ray from earlier in the same day. FINDINGS: Cardiovascular: Thoracic aorta and its branches are within normal limits. No aneurysmal dilatation or dissection is noted. No cardiac enlargement is seen. Pulmonary artery is well visualized within normal branching pattern. No definitive filling defect to suggest pulmonary embolism is noted although the lower lobe branches are suboptimally opacified. Mediastinum/Nodes: Thoracic inlet is within normal limits. No sizable mediastinal adenopathy is noted. Small likely reactive hilar lymph nodes are noted bilaterally. The esophagus as visualized is within normal limits. Lungs/Pleura: Lungs are well aerated bilaterally. Patchy infiltrate is noted diffusely throughout both lungs but worst in the lower lobes. This is consistent with multifocal pneumonia. No sizable effusion is seen. Upper Abdomen: Visualized upper abdomen is unremarkable. Musculoskeletal: No acute bony abnormality is noted. Review of the MIP images confirms the above findings. IMPRESSION: No evidence of pulmonary emboli. Patchy bilateral infiltrates with reactive adenopathy in the hila consistent with multifocal pneumonia. Electronically Signed   By: Alcide Clever M.D.   On: 03/26/2022 01:49   DG Chest Port 1 View  Result Date: 03/25/2022 CLINICAL DATA:  Hypoxia. EXAM: PORTABLE CHEST 1 VIEW  COMPARISON:  Radiographs dated March 24, 2022 FINDINGS: The heart size and mediastinal contours are within normal limits. Low lung volumes. Bibasilar hazy opacities suggesting atelectasis or infiltrate. The visualized skeletal structures are unremarkable. IMPRESSION: Bibasilar hazy opacities suggesting atelectasis or infiltrate. Follow-up examination with lateral views and improved inspiratory effort is suggested. Electronically Signed   By: Larose Hires D.O.   On: 03/25/2022 17:37   DG Chest 2 View  Result Date: 03/24/2022 CLINICAL DATA:  Cough, shortness of breath. EXAM: CHEST - 2 VIEW COMPARISON:  Chest radiograph dated January 22, 2022 FINDINGS: The heart size and mediastinal contours are within normal limits. Both lungs are clear. The visualized skeletal structures are unremarkable. IMPRESSION: No active cardiopulmonary disease. Electronically Signed   By: Larose Hires D.O.   On: 03/24/2022 18:14    Current scheduled medications  docusate sodium  100 mg Oral Daily   enoxaparin (LOVENOX) injection  40 mg Subcutaneous Q24H   fluticasone furoate-vilanterol  1 puff Inhalation Daily   methylPREDNISolone (SOLU-MEDROL) injection  40 mg Intravenous Q24H   pantoprazole  20 mg Oral Daily   prenatal vitamin w/FE, FA  1 tablet Oral Q1200   sodium chloride flush  3 mL Intravenous Q12H    I have reviewed the patient's current medications.  ASSESSMENT: Principal Problem:   Acute bronchiolitis due to respiratory syncytial virus (RSV) Active Problems:   Supervision of high-risk pregnancy   Asthma affecting pregnancy, antepartum   Preexisting diabetes complicating pregnancy, antepartum   Maternal morbid obesity, antepartum (HCC)   PLAN: RSV/Pneumonia +/- asthma exaceration - Appreciate Pulm team input. Will continue to follow their recommendations. Reviewed with the patient whatever helps her oxygenate is best for pregnancy as well.  - CTA negative for PE, likely viral pneumonia which is expected  given her symptoms and presentation.  - O2 currently by facemask. Improved in Prone position.  - Steroids currently 40 mg d - Breo added with duonebs prn.  - s/p Lasix x1  T2DM - Checking CBGs q6 hours currently - taking in minimal PO so would continue this timing for now. Fasting was 108. Non-fastings have ranged 108-182  on steroids.  - Likely will benefit for Metformin for home - will address closer to d/c once her condition improves  Fetal Well being - Continue NST BID  Prenatal Care - Not due for anything at this time.  - PNV when able to tolerate it.   Continue routine antenatal care.   Radene Gunning, MD, [place] [facility], [place]

## 2022-03-27 DIAGNOSIS — O24319 Unspecified pre-existing diabetes mellitus in pregnancy, unspecified trimester: Secondary | ICD-10-CM

## 2022-03-27 DIAGNOSIS — J45909 Unspecified asthma, uncomplicated: Secondary | ICD-10-CM

## 2022-03-27 DIAGNOSIS — O99519 Diseases of the respiratory system complicating pregnancy, unspecified trimester: Secondary | ICD-10-CM

## 2022-03-27 DIAGNOSIS — J210 Acute bronchiolitis due to respiratory syncytial virus: Secondary | ICD-10-CM | POA: Diagnosis not present

## 2022-03-27 DIAGNOSIS — O0992 Supervision of high risk pregnancy, unspecified, second trimester: Secondary | ICD-10-CM

## 2022-03-27 LAB — GLUCOSE, CAPILLARY
Glucose-Capillary: 130 mg/dL — ABNORMAL HIGH (ref 70–99)
Glucose-Capillary: 112 mg/dL — ABNORMAL HIGH (ref 70–99)
Glucose-Capillary: 208 mg/dL — ABNORMAL HIGH (ref 70–99)
Glucose-Capillary: 141 mg/dL — ABNORMAL HIGH (ref 70–99)
Glucose-Capillary: 102 mg/dL — ABNORMAL HIGH (ref 70–99)

## 2022-03-27 LAB — BASIC METABOLIC PANEL
Anion gap: 12 (ref 5–15)
BUN: 10 mg/dL (ref 6–20)
CO2: 21 mmol/L — ABNORMAL LOW (ref 22–32)
Calcium: 8.7 mg/dL — ABNORMAL LOW (ref 8.9–10.3)
Chloride: 104 mmol/L (ref 98–111)
Creatinine, Ser: 0.64 mg/dL (ref 0.44–1.00)
GFR, Estimated: >60 mL/min (ref >60)
Glucose, Bld: 104 mg/dL — ABNORMAL HIGH (ref 70–99)
Potassium: 3.7 mmol/L (ref 3.5–5.1)
Sodium: 137 mmol/L (ref 135–145)

## 2022-03-27 LAB — CBC
HCT: 33 % — ABNORMAL LOW (ref 36.0–46.0)
Hemoglobin: 10.5 g/dL — ABNORMAL LOW (ref 12.0–15.0)
MCH: 25.5 pg — ABNORMAL LOW (ref 26.0–34.0)
MCHC: 31.8 g/dL (ref 30.0–36.0)
MCV: 80.1 fL (ref 80.0–100.0)
Platelets: 368 10*3/uL (ref 150–400)
RBC: 4.12 MIL/uL (ref 3.87–5.11)
RDW: 12.9 % (ref 11.5–15.5)
WBC: 10.3 10*3/uL (ref 4.0–10.5)
nRBC: 0 % (ref 0.0–0.2)

## 2022-03-27 MED ORDER — INSULIN ASPART 100 UNIT/ML IJ SOLN
0.0000 [IU] | INTRAMUSCULAR | Status: DC
  Administered 2022-03-27: 5 [IU] via SUBCUTANEOUS
  Administered 2022-03-27: 2 [IU] via SUBCUTANEOUS
  Administered 2022-03-28: 5 [IU] via SUBCUTANEOUS
  Administered 2022-03-28: 2 [IU] via SUBCUTANEOUS
  Administered 2022-03-29: 3 [IU] via SUBCUTANEOUS
  Administered 2022-03-29: 5 [IU] via SUBCUTANEOUS
  Administered 2022-03-30: 2 [IU] via SUBCUTANEOUS
  Administered 2022-03-30 (×2): 3 [IU] via SUBCUTANEOUS

## 2022-03-27 NOTE — Progress Notes (Signed)
FACULTY PRACTICE ANTEPARTUM COMPREHENSIVE PROGRESS NOTE  Roberta Lutz is a 26 y.o. G2P1001 at 7538w6d who is admitted for RSV pneumonia.  Estimated Date of Delivery: 07/11/22 Fetal presentation is  variable .  Length of Stay:  2 Days. Admitted 03/24/2022  Subjective: She feels improved today compared to yesterday. She reports she is coughing up sputum.   Patient reports good fetal movement.  She reports no uterine contractions, no bleeding and no loss of fluid per vagina.  Vitals:  Blood pressure (!) 101/56, pulse 90, temperature 97.9 F (36.6 C), temperature source Oral, resp. rate (!) 30, height 5' 2.5" (1.588 m), weight 101.7 kg, last menstrual period 09/24/2021, SpO2 100 %, not currently breastfeeding. Physical Examination: CONSTITUTIONAL: Well-developed, well-nourished female in no acute distress in prone position.  NEUROLOGIC: Alert and oriented to person, place, and time. No cranial nerve deficit noted. PSYCHIATRIC: Normal mood and affect. Normal behavior. Normal judgment and thought content. CARDIOVASCULAR: Normal heart rate noted, regular rhythm RESPIRATORY: No excessive work of breathing, appears to be making normal respiratory effort. Does have desats when facemask is off and talking to mid-80s.  MUSCULOSKELETAL: Normal range of motion. No edema and no tenderness. 2+ distal pulses. ABDOMEN: Soft, nontender, nondistended, gravid.   Fetal monitoring: FHR: 150 bpm, Variability: moderate, Accelerations: Present, Decelerations: Absent - appropriate for gestational age Uterine activity: No contractions  Results for orders placed or performed during the hospital encounter of 03/24/22 (from the past 48 hour(s))  Glucose, capillary     Status: Abnormal   Collection Time: 03/25/22 11:17 AM  Result Value Ref Range   Glucose-Capillary 119 (H) 70 - 99 mg/dL    Comment: Glucose reference range applies only to samples taken after fasting for at least 8 hours.  Glucose, capillary      Status: Abnormal   Collection Time: 03/25/22  2:47 PM  Result Value Ref Range   Glucose-Capillary 108 (H) 70 - 99 mg/dL    Comment: Glucose reference range applies only to samples taken after fasting for at least 8 hours.  D-dimer, quantitative     Status: Abnormal   Collection Time: 03/25/22  5:26 PM  Result Value Ref Range   D-Dimer, Quant 1.24 (H) 0.00 - 0.50 ug/mL-FEU    Comment: (NOTE) At the manufacturer cut-off value of 0.5 g/mL FEU, this assay has a negative predictive value of 95-100%.This assay is intended for use in conjunction with a clinical pretest probability (PTP) assessment model to exclude pulmonary embolism (PE) and deep venous thrombosis (DVT) in outpatients suspected of PE or DVT. Results should be correlated with clinical presentation. Performed at [facility], [address] [place], [place] [address]   Brain natriuretic peptide     Status: None   Collection Time: 03/25/22  5:26 PM  Result Value Ref Range   B Natriuretic Peptide 12.2 0.0 - 100.0 pg/mL    Comment: Performed at [facility], [address] [place], [place] [address]  Troponin I (High Sensitivity)     Status: None   Collection Time: 03/25/22  5:26 PM  Result Value Ref Range   Troponin I (High Sensitivity) 3 <18 ng/L    Comment: (NOTE) Elevated high sensitivity troponin I (hsTnI) values and significant  changes across serial measurements may suggest ACS but many other  chronic and acute conditions are known to elevate hsTnI results.  Refer to the "Links" section for chest pain algorithms and additional  guidance. Performed at [facility], [address], [place], [place] [address]   Glucose, capillary     Status: Abnormal   Collection Time: 03/25/22  5:57 PM  Result Value Ref Range   Glucose-Capillary 182 (H) 70 - 99 mg/dL    Comment: Glucose reference range applies only to samples taken after fasting for at least 8 hours.  Glucose, capillary     Status: Abnormal    Collection Time: 03/25/22 11:58 PM  Result Value Ref Range   Glucose-Capillary 123 (H) 70 - 99 mg/dL    Comment: Glucose reference range applies only to samples taken after fasting for at least 8 hours.  CBC     Status: Abnormal   Collection Time: 03/26/22  4:25 AM  Result Value Ref Range   WBC 12.0 (H) 4.0 - 10.5 K/uL   RBC 4.14 3.87 - 5.11 MIL/uL   Hemoglobin 10.5 (L) 12.0 - 15.0 g/dL   HCT 12.8 (L) 78.6 - 76.7 %   MCV 79.5 (L) 80.0 - 100.0 fL   MCH 25.4 (L) 26.0 - 34.0 pg   MCHC 31.9 30.0 - 36.0 g/dL   RDW 20.9 47.0 - 96.2 %   Platelets 338 150 - 400 K/uL   nRBC 0.0 0.0 - 0.2 %    Comment: Performed at [facility], [address], [place], [place] [address]  Basic metabolic panel     Status: Abnormal   Collection Time: 03/26/22  4:25 AM  Result Value Ref Range   Sodium 135 135 - 145 mmol/L   Potassium 3.5 3.5 - 5.1 mmol/L   Chloride 103 98 - 111 mmol/L   CO2 20 (L) 22 - 32 mmol/L   Glucose, Bld 103 (H) 70 - 99 mg/dL    Comment: Glucose reference range applies only to samples taken after fasting for at least 8 hours.   BUN 5 (L) 6 - 20 mg/dL   Creatinine, Ser 9.47 0.44 - 1.00 mg/dL   Calcium 8.6 (L) 8.9 - 10.3 mg/dL   GFR, Estimated >65 >46 mL/min    Comment: (NOTE) Calculated using the CKD-EPI Creatinine Equation (2021)    Anion gap 12 5 - 15    Comment: Performed at [facility], [address] [place], [place] [address]  Magnesium     Status: None   Collection Time: 03/26/22  4:25 AM  Result Value Ref Range   Magnesium 2.1 1.7 - 2.4 mg/dL    Comment: Performed at [facility], [address] [place], [place] [address]  Glucose, capillary     Status: Abnormal   Collection Time: 03/26/22  5:58 AM  Result Value Ref Range   Glucose-Capillary 108 (H) 70 - 99 mg/dL    Comment: Glucose reference range applies only to samples taken after fasting for at least 8 hours.  Glucose, capillary     Status: Abnormal   Collection Time: 03/26/22  1:50 PM  Result  Value Ref Range   Glucose-Capillary 169 (H) 70 - 99 mg/dL    Comment: Glucose reference range applies only to samples taken after fasting for at least 8 hours.  Culture, Respiratory w Gram Stain     Status: None (Preliminary result)   Collection Time: 03/26/22  5:37 PM   Specimen: Tracheal Aspirate; Respiratory  Result Value Ref Range   Specimen Description TRACHEAL ASPIRATE    Special Requests NONE    Gram Stain      FEW WBC PRESENT,BOTH PMN AND MONONUCLEAR FEW GRAM POSITIVE COCCI IN PAIRS AND  CHAINS RARE GRAM NEGATIVE RODS Performed at [facility], [address] [place], [place] [address]    Culture PENDING    Report Status PENDING   Glucose, capillary     Status: Abnormal   Collection Time: 03/26/22  7:59 PM  Result Value Ref Range   Glucose-Capillary 154 (H) 70 - 99 mg/dL    Comment: Glucose reference range applies only to samples taken after fasting for at least 8 hours.  Glucose, capillary     Status: Abnormal   Collection Time: 03/27/22  2:07 AM  Result Value Ref Range   Glucose-Capillary 130 (H) 70 - 99 mg/dL    Comment: Glucose reference range applies only to samples taken after fasting for at least 8 hours.   Comment 1 Notify RN    Comment 2 Document in Chart   CBC     Status: Abnormal   Collection Time: 03/27/22  4:54 AM  Result Value Ref Range   WBC 10.3 4.0 - 10.5 K/uL   RBC 4.12 3.87 - 5.11 MIL/uL   Hemoglobin 10.5 (L) 12.0 - 15.0 g/dL   HCT 58.0 (L) 99.8 - 33.8 %   MCV 80.1 80.0 - 100.0 fL   MCH 25.5 (L) 26.0 - 34.0 pg   MCHC 31.8 30.0 - 36.0 g/dL   RDW 25.0 53.9 - 76.7 %   Platelets 368 150 - 400 K/uL   nRBC 0.0 0.0 - 0.2 %    Comment: Performed at [facility], [address], [place], [place] [address]  Basic metabolic panel     Status: Abnormal   Collection Time: 03/27/22  4:54 AM  Result Value Ref Range   Sodium 137 135 - 145 mmol/L   Potassium 3.7 3.5 - 5.1 mmol/L   Chloride 104 98 - 111 mmol/L   CO2 21 (L) 22 - 32 mmol/L    Glucose, Bld 104 (H) 70 - 99 mg/dL    Comment: Glucose reference range applies only to samples taken after fasting for at least 8 hours.   BUN 10 6 - 20 mg/dL   Creatinine, Ser 7.90 0.44 - 1.00 mg/dL   Calcium 8.7 (L) 8.9 - 10.3 mg/dL   GFR, Estimated >24 >09 mL/min    Comment: (NOTE) Calculated using the CKD-EPI Creatinine Equation (2021)    Anion gap 12 5 - 15    Comment: Performed at [facility], [address] [place], [place] [address]  Glucose, capillary     Status: Abnormal   Collection Time: 03/27/22  8:08 AM  Result Value Ref Range   Glucose-Capillary 112 (H) 70 - 99 mg/dL    Comment: Glucose reference range applies only to samples taken after fasting for at least 8 hours.    CT Angio Chest Pulmonary Embolism (PE) W or WO Contrast  Result Date: 03/26/2022 CLINICAL DATA:  Shortness of breath, pregnancy with RSV, initial encounter EXAM: CT ANGIOGRAPHY CHEST WITH CONTRAST TECHNIQUE: Multidetector CT imaging of the chest was performed using the standard protocol during bolus administration of intravenous contrast. Multiplanar CT image reconstructions and MIPs were obtained to evaluate the vascular anatomy. RADIATION DOSE REDUCTION: This exam was performed according to the departmental dose-optimization program which includes automated exposure control, adjustment of the mA and/or kV according to patient size and/or use of iterative reconstruction technique. CONTRAST:  78mL OMNIPAQUE IOHEXOL 350 MG/ML SOLN COMPARISON:  Chest x-ray from earlier in the same day. FINDINGS: Cardiovascular: Thoracic aorta and its branches are within  normal limits. No aneurysmal dilatation or dissection is noted. No cardiac enlargement is seen. Pulmonary artery is well visualized within normal branching pattern. No definitive filling defect to suggest pulmonary embolism is noted although the lower lobe branches are suboptimally opacified. Mediastinum/Nodes: Thoracic inlet is within normal limits. No  sizable mediastinal adenopathy is noted. Small likely reactive hilar lymph nodes are noted bilaterally. The esophagus as visualized is within normal limits. Lungs/Pleura: Lungs are well aerated bilaterally. Patchy infiltrate is noted diffusely throughout both lungs but worst in the lower lobes. This is consistent with multifocal pneumonia. No sizable effusion is seen. Upper Abdomen: Visualized upper abdomen is unremarkable. Musculoskeletal: No acute bony abnormality is noted. Review of the MIP images confirms the above findings. IMPRESSION: No evidence of pulmonary emboli. Patchy bilateral infiltrates with reactive adenopathy in the hila consistent with multifocal pneumonia. Electronically Signed   By: Alcide Clever M.D.   On: 03/26/2022 01:49   DG Chest Port 1 View  Result Date: 03/25/2022 CLINICAL DATA:  Hypoxia. EXAM: PORTABLE CHEST 1 VIEW COMPARISON:  Radiographs dated March 24, 2022 FINDINGS: The heart size and mediastinal contours are within normal limits. Low lung volumes. Bibasilar hazy opacities suggesting atelectasis or infiltrate. The visualized skeletal structures are unremarkable. IMPRESSION: Bibasilar hazy opacities suggesting atelectasis or infiltrate. Follow-up examination with lateral views and improved inspiratory effort is suggested. Electronically Signed   By: Larose Hires D.O.   On: 03/25/2022 17:37    Current scheduled medications  docusate sodium  100 mg Oral Daily   enoxaparin (LOVENOX) injection  40 mg Subcutaneous Q24H   fluticasone furoate-vilanterol  1 puff Inhalation Daily   methylPREDNISolone (SOLU-MEDROL) injection  40 mg Intravenous Q24H   pantoprazole  20 mg Oral Daily   prenatal vitamin w/FE, FA  1 tablet Oral Q1200   sodium chloride flush  3 mL Intravenous Q12H    I have reviewed the patient's current medications.  ASSESSMENT: Principal Problem:   Acute bronchiolitis due to respiratory syncytial virus (RSV) Active Problems:   Supervision of high-risk  pregnancy   Asthma affecting pregnancy, antepartum   Preexisting diabetes complicating pregnancy, antepartum   Maternal morbid obesity, antepartum (HCC)   PLAN: RSV/Pneumonia +/- asthma exaceration - Appreciate Pulm team input. Will continue to follow their recommendations. Reviewed with the patient whatever helps her oxygenate is best for pregnancy as well.  - CTA negative for PE, likely viral pneumonia which is expected given her symptoms and presentation.  - O2 currently by facemask. Weaned from 10L to 6L overnight.  - Steroids currently 40 mg d - Breo with duonebs prn.  - Sputum culture 12/11 pending   T2DM - Checking CBGs q6 hours currently - taking in minimal PO so would continue this timing for now. Fasting was 108. Non-fastings have ranged 108-182 on steroids.  - Likely will benefit for Metformin for home - will address closer to d/c once her condition improves - Will do SSI until on a regular meal structure.   Fetal Well being - Continue NST BID  Prenatal Care - Not due for anything at this time.  - PNV when able to tolerate it.   Continue routine antenatal care.   Milas Hock, MD, FACOG Obstetrician & Gynecologist, [facility], [facility]

## 2022-03-27 NOTE — Progress Notes (Addendum)
Attending note: discussed with NP, continue current management.  Wean O2 for sats >95%, proning as tolerated.  I will see tomorrow.     NAME:  Roberta Lutz, MRN:  [id number], DOB:  04/19/1995, LOS: 2 ADMISSION DATE:  03/24/2022, CONSULTATION DATE:  03/25/22 REFERRING MD:  Dr. Macon Large, CHIEF COMPLAINT:  SOB   History of Present Illness:  26 year old female with prior hx of G2P1 currently 24 weeks, DMT2, gestational HTN, PCOS, anxiety, obesity, and recent diagnosis of asthma in October 2023 who presented on 12/9 with shortness of breath.   Patient denies any childhood asthma or previous lung diagnoses.  Never cigarette smoker, never vaper, but smoked THC prior to this pregnancy to help with her anxiety.  Was seen back in MAU in October with shortness of breath after inhalation exposure with concern for OSA/ OHS, improved after nebs and since has albuterol HFA as needed.    Sick exposure from her child one week ago, however all testing for her child was negative.  Prior to one week ago, patient was in her normal state of health and rarely needed her inhaler.  Over the last week she developed decreased appetite and progressive shortness of breath and cough, not really helped by her inhaler.  On Saturday she woke up feeling worse and presented to MAU, found to be febrile 102.1, room air saturation 94%, RSV +, CXR neg, and with fetal tachycardia.  She was admitted to OB, and started on supplemental O2, duonebs, and steroids.  Pulmonary consulted for further recommendations as patient continues to have SOB and O2 drops to low 90's off oxygen.  She denies any LE edema or calf pain.  Reports coughing up clear phlegm.    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 Continue current interventions  Objective   Blood pressure (!) 101/56, pulse 90, temperature 97.9 F (36.6 C), temperature source Oral, resp. rate (!) 30, height 5' 2.5" (1.588 m), weight 101.7 kg, last menstrual period 09/24/2021, SpO2 100 %, not currently breastfeeding.    FiO2 (%):  [100 %] 100 %   Intake/Output Summary (Last 24 hours) at 03/27/2022 8099 Last data filed at 03/27/2022 0443 Gross per 24 hour  Intake 430 ml  Output 200 ml  Net 230 ml   Filed Weights   03/24/22 1557  Weight: 101.7 kg   Examination: General: Young female no acute distress at rest HEENT: MM pink/moist facemask in place 8 L oxygen flow currently with sats of 98% Neuro: Grossly intact without focal defect CV: Heart sounds are regular PULM: Rhonchi and crackles 8 L via mask more for comfort GI: soft, bsx4 active  GU: Voids Extremities: warm/dry, negative edema  Skin: no rashes or lesions   Resolved Hospital Problem list    Assessment & Plan:   Acute Hypoxic Respiratory Failure RSV +/- Viral Pneumonia  Possible Asthma Exacerbation - no formal workup [redacted] weeks gestation RSV positive.  Ongoing O2 needs.  PE ruled out. CTA chest negative for PE but shows diffuse bilateral patchy infiltrates concerning for possible viral PNA vs pneumonitis.  Additional findings of airway thickening / chronic bronchitis type changes.    Wean O2 as  able currently on 03/27/2022 at 0840 hrs. down to 8 L via mask Continues to have rhonchi and crackles Continue bronchodilators Continue steroids Pulmonary hygiene Questionable need for empirical antimicrobial therapy Monitor culture data, tracheal aspirate pending Proton pump inhibitor  -O2 to  Phone numbers provided for staff, written on board for communication at night.   Pulmonary will see again 12/12, call sooner if needed.  Remainder per primary.     Best Practice (right click and "Reselect all SmartList Selections" daily)  Per primary  Critical care time: n/a    Brett Canales Minor ACNP Acute Care Nurse Practitioner [facility] Pulmonary/Critical Care Please consult Amion 03/27/2022, 8:38 AM

## 2022-03-28 ENCOUNTER — Encounter: Payer: Medicaid Other | Admitting: Student

## 2022-03-28 DIAGNOSIS — J210 Acute bronchiolitis due to respiratory syncytial virus: Secondary | ICD-10-CM | POA: Diagnosis not present

## 2022-03-28 LAB — GLUCOSE, CAPILLARY
Glucose-Capillary: 101 mg/dL — ABNORMAL HIGH (ref 70–99)
Glucose-Capillary: 119 mg/dL — ABNORMAL HIGH (ref 70–99)
Glucose-Capillary: 148 mg/dL — ABNORMAL HIGH (ref 70–99)
Glucose-Capillary: 230 mg/dL — ABNORMAL HIGH (ref 70–99)
Glucose-Capillary: 86 mg/dL (ref 70–99)
Glucose-Capillary: 94 mg/dL (ref 70–99)

## 2022-03-28 MED ORDER — PREDNISONE 20 MG PO TABS: 20.0000 mg | ORAL_TABLET | Freq: Every day | ORAL | Status: DC

## 2022-03-28 MED ORDER — PREDNISONE 20 MG PO TABS
40.0000 mg | ORAL_TABLET | Freq: Every day | ORAL | Status: DC
  Administered 2022-03-29 – 2022-03-30 (×2): 40 mg via ORAL
  Filled 2022-03-28 (×2): qty 2

## 2022-03-28 NOTE — Progress Notes (Signed)
Attending note: discussed with NP, continue current management.  Wean O2 for sats >95%, proning as tolerated.  I will see tomorrow.     NAME:  Roberta Lutz, MRN:  [id number], DOB:  17-Dec-1995, LOS: 2 ADMISSION DATE:  03/24/2022, CONSULTATION DATE:  03/25/22 REFERRING MD:  Dr. Macon Large, CHIEF COMPLAINT:  SOB   History of Present Illness:  26 year old female with prior hx of G2P1 currently 24 weeks, DMT2, gestational HTN, PCOS, anxiety, obesity, and recent diagnosis of asthma in October 2023 who presented on 12/9 with shortness of breath.   Patient denies any childhood asthma or previous lung diagnoses.  Never cigarette smoker, never vaper, but smoked THC prior to this pregnancy to help with her anxiety.  Was seen back in MAU in October with shortness of breath after inhalation exposure with concern for OSA/ OHS, improved after nebs and since has albuterol HFA as needed.    Sick exposure from her child one week ago, however all testing for her child was negative.  Prior to one week ago, patient was in her normal state of health and rarely needed her inhaler.  Over the last week she developed decreased appetite and progressive shortness of breath and cough, not really helped by her inhaler.  On Saturday she woke up feeling worse and presented to MAU, found to be febrile 102.1, room air saturation 94%, RSV +, CXR neg, and with fetal tachycardia.  She was admitted to OB, and started on supplemental O2, duonebs, and steroids.  Pulmonary consulted for further recommendations as patient continues to have SOB and O2 drops to low 90's off oxygen.  She denies any LE edema or calf pain.  Reports coughing up clear phlegm.    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 Still on 6 liters via simple mask   Objective   Blood pressure (!) 101/56, pulse 90, temperature 97.9 F (36.6 C), temperature source Oral, resp. rate (!) 30, height 5' 2.5" (1.588 m), weight 101.7 kg, last menstrual period 09/24/2021, SpO2 100 %, not currently breastfeeding.    FiO2 (%):  [100 %] 100 %   Intake/Output Summary (Last 24 hours) at 03/27/2022 0838 Last data filed at 03/27/2022 0443 Gross per 24 hour  Intake 430 ml  Output 200 ml  Net 230 ml   Filed Weights   03/24/22 1557  Weight: 101.7 kg   Examination:  General sitting up in bed no distress HENT NCAT no JVD  Pulm crackles in bases. No accessory use  Card rrr Abd soft  Ext warm dry  Neuro intact   Resolved Hospital Problem list    Assessment & Plan:   Acute Hypoxic Respiratory Failure RSV +/- Viral Pneumonia  Possible Asthma Exacerbation - no formal workup [redacted] weeks gestation  Discussion Slowly improving. Expect gradual improvement w/ cough that can linger for weeks.   Plan Cont to wean O2 Cont BDs Cont steroid day 3 of 5 Pulm hygiene   Best Practice (right click and "Reselect all SmartList Selections" daily)  Per primary  Critical care time: n/a

## 2022-03-28 NOTE — Progress Notes (Signed)
Roberta Lutz COMPREHENSIVE PROGRESS NOTE  Roberta Lutz is a 26 y.o. G2P1001 at [redacted]w[redacted]d who is admitted for RSV pneumonia.  Estimated Date of Delivery: 07/11/22 Fetal presentation is  variable .  Length of Stay:  3 Days. Admitted 03/24/2022  Subjective: She feels improved today compared to yesterday again. She reports she is coughing up sputum but it is clear. Was able to eat for the first time in almost a week last night.   Patient reports good fetal movement.  She reports no uterine contractions, no bleeding and no loss of fluid per vagina.  Vitals:  Blood pressure (!) 103/57, pulse 98, temperature 98.5 F (36.9 C), temperature source Oral, resp. rate 18, height 5' 2.5" (1.588 m), weight 101.7 kg, last menstrual period 09/24/2021, SpO2 93 %, not currently breastfeeding. Physical Examination: CONSTITUTIONAL: Well-developed, well-nourished female in no acute distress in prone position.  NEUROLOGIC: Alert and oriented to person, place, and time. No cranial nerve deficit noted. PSYCHIATRIC: Normal mood and affect. Normal behavior. Normal judgment and thought content. CARDIOVASCULAR: Normal heart rate noted, regular rhythm RESPIRATORY: No excessive work of breathing, appears to be making normal respiratory effort MUSCULOSKELETAL: Normal range of motion. No edema and no tenderness. 2+ distal pulses. ABDOMEN: Soft, nontender, nondistended, gravid.   Fetal monitoring: FHR: 150 bpm, Variability: moderate, Accelerations: Present, Decelerations: Absent - appropriate for gestational age Uterine activity: No contractions  Results for orders placed or performed during the hospital encounter of 03/24/22 (from the past 48 hour(s))  Glucose, capillary     Status: Abnormal   Collection Time: 03/26/22  1:50 PM  Result Value Ref Range   Glucose-Capillary 169 (H) 70 - 99 mg/dL    Comment: Glucose reference range applies only to samples taken after fasting for at least 8 hours.  Culture,  Respiratory w Gram Stain     Status: None (Preliminary result)   Collection Time: 03/26/22  5:37 PM   Specimen: Tracheal Aspirate; Respiratory  Result Value Ref Range   Specimen Description TRACHEAL ASPIRATE    Special Requests NONE    Gram Stain      FEW WBC PRESENT,BOTH PMN AND MONONUCLEAR FEW GRAM POSITIVE COCCI IN PAIRS AND CHAINS RARE GRAM NEGATIVE RODS    Culture      Normal respiratory flora-no Staph aureus or Pseudomonas seen Performed at [facility] [address], [place], [place] [address]    Report Status PENDING   Glucose, capillary     Status: Abnormal   Collection Time: 03/26/22  7:59 PM  Result Value Ref Range   Glucose-Capillary 154 (H) 70 - 99 mg/dL    Comment: Glucose reference range applies only to samples taken after fasting for at least 8 hours.  Glucose, capillary     Status: Abnormal   Collection Time: 03/27/22  2:07 AM  Result Value Ref Range   Glucose-Capillary 130 (H) 70 - 99 mg/dL    Comment: Glucose reference range applies only to samples taken after fasting for at least 8 hours.   Comment 1 Notify RN    Comment 2 Document in Chart   CBC     Status: Abnormal   Collection Time: 03/27/22  4:54 AM  Result Value Ref Range   WBC 10.3 4.0 - 10.5 K/uL   RBC 4.12 3.87 - 5.11 MIL/uL   Hemoglobin 10.5 (L) 12.0 - 15.0 g/dL   HCT 33.0 (L) 36.0 - 46.0 %   MCV 80.1 80.0 - 100.0 fL   MCH 25.5 (L)  26.0 - 34.0 pg   MCHC 31.8 30.0 - 36.0 g/dL   RDW 14.4 31.5 - 40.0 %   Platelets 368 150 - 400 K/uL   nRBC 0.0 0.0 - 0.2 %    Comment: Performed at [facility], [address] [place], [place] [address]  Basic metabolic panel     Status: Abnormal   Collection Time: 03/27/22  4:54 AM  Result Value Ref Range   Sodium 137 135 - 145 mmol/L   Potassium 3.7 3.5 - 5.1 mmol/L   Chloride 104 98 - 111 mmol/L   CO2 21 (L) 22 - 32 mmol/L   Glucose, Bld 104 (H) 70 - 99 mg/dL    Comment: Glucose reference range applies only to samples taken after fasting for  at least 8 hours.   BUN 10 6 - 20 mg/dL   Creatinine, Ser 9.50 0.44 - 1.00 mg/dL   Calcium 8.7 (L) 8.9 - 10.3 mg/dL   GFR, Estimated >93 >26 mL/min    Comment: (NOTE) Calculated using the CKD-EPI Creatinine Equation (2021)    Anion gap 12 5 - 15    Comment: Performed at [facility], [address] [place], [place] [address]  Glucose, capillary     Status: Abnormal   Collection Time: 03/27/22  8:08 AM  Result Value Ref Range   Glucose-Capillary 112 (H) 70 - 99 mg/dL    Comment: Glucose reference range applies only to samples taken after fasting for at least 8 hours.  Glucose, capillary     Status: Abnormal   Collection Time: 03/27/22 12:07 PM  Result Value Ref Range   Glucose-Capillary 208 (H) 70 - 99 mg/dL    Comment: Glucose reference range applies only to samples taken after fasting for at least 8 hours.  Glucose, capillary     Status: Abnormal   Collection Time: 03/27/22  4:18 PM  Result Value Ref Range   Glucose-Capillary 141 (H) 70 - 99 mg/dL    Comment: Glucose reference range applies only to samples taken after fasting for at least 8 hours.  Glucose, capillary     Status: Abnormal   Collection Time: 03/27/22  8:26 PM  Result Value Ref Range   Glucose-Capillary 102 (H) 70 - 99 mg/dL    Comment: Glucose reference range applies only to samples taken after fasting for at least 8 hours.   Comment 1 Notify RN   Glucose, capillary     Status: Abnormal   Collection Time: 03/28/22 12:04 AM  Result Value Ref Range   Glucose-Capillary 101 (H) 70 - 99 mg/dL    Comment: Glucose reference range applies only to samples taken after fasting for at least 8 hours.  Glucose, capillary     Status: None   Collection Time: 03/28/22  4:16 AM  Result Value Ref Range   Glucose-Capillary 94 70 - 99 mg/dL    Comment: Glucose reference range applies only to samples taken after fasting for at least 8 hours.  Glucose, capillary     Status: Abnormal   Collection Time: 03/28/22  8:18 AM   Result Value Ref Range   Glucose-Capillary 119 (H) 70 - 99 mg/dL    Comment: Glucose reference range applies only to samples taken after fasting for at least 8 hours.    No results found.  Current scheduled medications  docusate sodium  100 mg Oral Daily   enoxaparin (LOVENOX) injection  40 mg Subcutaneous Q24H   fluticasone furoate-vilanterol  1 puff  Inhalation Daily   insulin aspart  0-15 Units Subcutaneous Q4H   methylPREDNISolone (SOLU-MEDROL) injection  40 mg Intravenous Q24H   pantoprazole  20 mg Oral Daily   prenatal vitamin w/FE, FA  1 tablet Oral Q1200   sodium chloride flush  3 mL Intravenous Q12H    I have reviewed the patient's current medications.  ASSESSMENT: Principal Problem:   Acute bronchiolitis due to respiratory syncytial virus (RSV) Active Problems:   Supervision of high-risk pregnancy   Asthma affecting pregnancy, antepartum   Preexisting diabetes complicating pregnancy, antepartum   Maternal morbid obesity, antepartum ([place])   PLAN: RSV/Pneumonia +/- asthma exaceration - Appreciate Pulm team input. Will continue to follow their recommendations. Reviewed with the patient whatever helps her oxygenate is best for pregnancy as well.  - CTA negative for PE, likely viral pneumonia which is expected given her symptoms and presentation.  - O2 currently by facemask. Weaned from 10L to 6L overnight.  - Steroids currently 40 mg daily  - Breo with duonebs prn.  - Sputum culture 12/11 pending   T2DM - Checking CBGs q6 hours currently - taking in minimal PO so would continue this timing for now. Fasting was 94. Non-fastings have ranged 101-208 on steroids.  - Likely will benefit for Metformin for home - will address closer to d/c once her condition improves - Will do SSI until on a regular meal structure.   Fetal Well being - Continue NST BID  Prenatal Care - Not due for anything at this time.  - PNV when able to tolerate it.   Continue routine antenatal  care.   Radene Gunning, MD, [place] for [facility], [place]

## 2022-03-29 ENCOUNTER — Telehealth: Payer: Self-pay | Admitting: Internal Medicine

## 2022-03-29 LAB — GLUCOSE, CAPILLARY
Glucose-Capillary: 102 mg/dL — ABNORMAL HIGH (ref 70–99)
Glucose-Capillary: 102 mg/dL — ABNORMAL HIGH (ref 70–99)
Glucose-Capillary: 110 mg/dL — ABNORMAL HIGH (ref 70–99)
Glucose-Capillary: 116 mg/dL — ABNORMAL HIGH (ref 70–99)
Glucose-Capillary: 159 mg/dL — ABNORMAL HIGH (ref 70–99)
Glucose-Capillary: 208 mg/dL — ABNORMAL HIGH (ref 70–99)

## 2022-03-29 LAB — CULTURE, RESPIRATORY W GRAM STAIN: Culture: NORMAL

## 2022-03-29 NOTE — Telephone Encounter (Addendum)
2 week followup for O2 check

## 2022-03-29 NOTE — Progress Notes (Signed)
[facility] COMPREHENSIVE PROGRESS NOTE  Latera Whillock is a 26 y.o. G2P1001 at [redacted]w[redacted]d who is admitted for RSV pneumonia.  Estimated Date of Delivery: 07/11/22 Fetal presentation is  variable .  Length of Stay:  4 Days. Admitted 03/24/2022  Subjective: She feels much improved today. Primary coughing and productive sputum is in AM. Sputum remains clear. She was able to eat and even had a shower off oxygen overnight.    Patient reports good fetal movement.  She reports no uterine contractions, no bleeding and no loss of fluid per vagina.  Vitals:  Blood pressure (!) 95/41, pulse 75, temperature 98.4 F (36.9 C), temperature source Oral, resp. rate 19, height 5' 2.5" (1.588 m), weight 101.7 kg, last menstrual period 09/24/2021, SpO2 95 %, not currently breastfeeding. Physical Examination: CONSTITUTIONAL: Well-developed, well-nourished female in no acute distress in prone position.  NEUROLOGIC: Alert and oriented to person, place, and time. No cranial nerve deficit noted. PSYCHIATRIC: Normal mood and affect. Normal behavior. Normal judgment and thought content. CARDIOVASCULAR: Normal heart rate noted, regular rhythm RESPIRATORY: No excessive work of breathing, appears to be making normal respiratory effort MUSCULOSKELETAL: Normal range of motion. No edema and no tenderness. 2+ distal pulses. ABDOMEN: Soft, nontender, nondistended, gravid.   Fetal monitoring: FHR: 150 bpm, Variability: moderate, Accelerations: Present, Decelerations: Absent - appropriate for gestational age Uterine activity: No contractions  Results for orders placed or performed during the hospital encounter of 03/24/22 (from the past 48 hour(s))  Glucose, capillary     Status: Abnormal   Collection Time: 03/27/22  4:18 PM  Result Value Ref Range   Glucose-Capillary 141 (H) 70 - 99 mg/dL    Comment: Glucose reference range applies only to samples taken after fasting for at least 8 hours.  Glucose, capillary      Status: Abnormal   Collection Time: 03/27/22  8:26 PM  Result Value Ref Range   Glucose-Capillary 102 (H) 70 - 99 mg/dL    Comment: Glucose reference range applies only to samples taken after fasting for at least 8 hours.   Comment 1 Notify RN   Glucose, capillary     Status: Abnormal   Collection Time: 03/28/22 12:04 AM  Result Value Ref Range   Glucose-Capillary 101 (H) 70 - 99 mg/dL    Comment: Glucose reference range applies only to samples taken after fasting for at least 8 hours.  Glucose, capillary     Status: None   Collection Time: 03/28/22  4:16 AM  Result Value Ref Range   Glucose-Capillary 94 70 - 99 mg/dL    Comment: Glucose reference range applies only to samples taken after fasting for at least 8 hours.  Glucose, capillary     Status: Abnormal   Collection Time: 03/28/22  8:18 AM  Result Value Ref Range   Glucose-Capillary 119 (H) 70 - 99 mg/dL    Comment: Glucose reference range applies only to samples taken after fasting for at least 8 hours.  Glucose, capillary     Status: Abnormal   Collection Time: 03/28/22 12:29 PM  Result Value Ref Range   Glucose-Capillary 230 (H) 70 - 99 mg/dL    Comment: Glucose reference range applies only to samples taken after fasting for at least 8 hours.  Glucose, capillary     Status: Abnormal   Collection Time: 03/28/22  4:25 PM  Result Value Ref Range   Glucose-Capillary 148 (H) 70 - 99 mg/dL    Comment: Glucose reference range applies only to samples  taken after fasting for at least 8 hours.  Glucose, capillary     Status: None   Collection Time: 03/28/22  8:23 PM  Result Value Ref Range   Glucose-Capillary 86 70 - 99 mg/dL    Comment: Glucose reference range applies only to samples taken after fasting for at least 8 hours.  Glucose, capillary     Status: Abnormal   Collection Time: 03/29/22 12:03 AM  Result Value Ref Range   Glucose-Capillary 110 (H) 70 - 99 mg/dL    Comment: Glucose reference range applies only to samples  taken after fasting for at least 8 hours.  Glucose, capillary     Status: Abnormal   Collection Time: 03/29/22  4:06 AM  Result Value Ref Range   Glucose-Capillary 102 (H) 70 - 99 mg/dL    Comment: Glucose reference range applies only to samples taken after fasting for at least 8 hours.  Glucose, capillary     Status: Abnormal   Collection Time: 03/29/22  8:09 AM  Result Value Ref Range   Glucose-Capillary 116 (H) 70 - 99 mg/dL    Comment: Glucose reference range applies only to samples taken after fasting for at least 8 hours.  Glucose, capillary     Status: Abnormal   Collection Time: 03/29/22 12:10 PM  Result Value Ref Range   Glucose-Capillary 159 (H) 70 - 99 mg/dL    Comment: Glucose reference range applies only to samples taken after fasting for at least 8 hours.    No results found.  Current scheduled medications  docusate sodium  100 mg Oral Daily   enoxaparin (LOVENOX) injection  40 mg Subcutaneous Q24H   fluticasone furoate-vilanterol  1 puff Inhalation Daily   insulin aspart  0-15 Units Subcutaneous Q4H   pantoprazole  20 mg Oral Daily   predniSONE  40 mg Oral Q breakfast   Followed by   Melene Muller ON 04/05/2022] predniSONE  20 mg Oral Q breakfast   prenatal vitamin w/FE, FA  1 tablet Oral Q1200   sodium chloride flush  3 mL Intravenous Q12H    I have reviewed the patient's current medications.  ASSESSMENT: Principal Problem:   Acute bronchiolitis due to respiratory syncytial virus (RSV) Active Problems:   Supervision of high-risk pregnancy   Asthma affecting pregnancy, antepartum   Preexisting diabetes complicating pregnancy, antepartum   Maternal morbid obesity, antepartum ([facility])   PLAN: RSV/Pneumonia +/- asthma exaceration - Appreciate Pulm team input. Will continue to follow their recommendations. Reviewed with the patient whatever helps her oxygenate is best for pregnancy as well.  - CTA negative for PE, likely viral pneumonia which is expected given her  symptoms and presentation.  - O2 currently by facemask. Weaned from 6L to 4L by facemask and saturations were excellent. She was switched to [place] today and has been tolerating well - 2-4L depending on her activity.  - Steroids currently 40 mg daily - she is on day 4/5 of this. Per plan by Pulmonary team, will taper to Prednisone 40 mg x1 week and then 20 mg x1 week.  Virgel Bouquet with duonebs prn.  - Sputum culture negative - Plan is for discharge home with possible O2 needs based on today would be no more than 4L [place]. TOC consulted. Notified Dr. Katrinka Blazing so he will arrange her pulmonary outpt follow up.   T2DM - Will adjust timing to be related to meals.  - Likely will benefit for Metformin for home - will address at D/C tomorrow.  -  Will do SSI until on a regular meal structure.   Fetal Well being - Continue NST daily with improved status.   Prenatal Care - Not due for anything at this time.  - PNV when able to tolerate it.   Continue routine antenatal care.   Radene Gunning, MD, [place] for [facility], [place]

## 2022-03-30 ENCOUNTER — Other Ambulatory Visit (HOSPITAL_COMMUNITY): Payer: Self-pay

## 2022-03-30 DIAGNOSIS — O0900 Supervision of pregnancy with history of infertility, unspecified trimester: Secondary | ICD-10-CM | POA: Diagnosis not present

## 2022-03-30 DIAGNOSIS — J210 Acute bronchiolitis due to respiratory syncytial virus: Secondary | ICD-10-CM | POA: Diagnosis not present

## 2022-03-30 DIAGNOSIS — O9952 Diseases of the respiratory system complicating childbirth: Secondary | ICD-10-CM | POA: Diagnosis not present

## 2022-03-30 DIAGNOSIS — O24111 Pre-existing diabetes mellitus, type 2, in pregnancy, first trimester: Secondary | ICD-10-CM | POA: Diagnosis not present

## 2022-03-30 LAB — GLUCOSE, CAPILLARY
Glucose-Capillary: 128 mg/dL — ABNORMAL HIGH (ref 70–99)
Glucose-Capillary: 155 mg/dL — ABNORMAL HIGH (ref 70–99)
Glucose-Capillary: 173 mg/dL — ABNORMAL HIGH (ref 70–99)
Glucose-Capillary: 89 mg/dL (ref 70–99)

## 2022-03-30 MED ORDER — IPRATROPIUM-ALBUTEROL 0.5-2.5 (3) MG/3ML IN SOLN
3.0000 mL | RESPIRATORY_TRACT | 0 refills | Status: DC | PRN
  Filled 2022-03-30: qty 360, 20d supply, fill #0

## 2022-03-30 MED ORDER — PANTOPRAZOLE SODIUM 20 MG PO TBEC
20.0000 mg | DELAYED_RELEASE_TABLET | Freq: Every day | ORAL | 0 refills | Status: DC
  Filled 2022-03-30: qty 30, 30d supply, fill #0

## 2022-03-30 MED ORDER — GLYBURIDE 2.5 MG PO TABS
1.2500 mg | ORAL_TABLET | Freq: Every day | ORAL | 1 refills | Status: DC
  Filled 2022-03-30: qty 15, 30d supply, fill #0

## 2022-03-30 MED ORDER — METFORMIN HCL 500 MG PO TABS
500.0000 mg | ORAL_TABLET | Freq: Two times a day (BID) | ORAL | 5 refills | Status: DC
  Filled 2022-03-30: qty 60, 30d supply, fill #0

## 2022-03-30 MED ORDER — GUAIFENESIN 100 MG/5ML PO LIQD
10.0000 mL | ORAL | 0 refills | Status: DC | PRN
  Filled 2022-03-30: qty 120, 2d supply, fill #0

## 2022-03-30 MED ORDER — PREDNISONE 20 MG PO TABS
40.0000 mg | ORAL_TABLET | Freq: Every day | ORAL | 0 refills | Status: AC
  Filled 2022-03-30: qty 21, 14d supply, fill #0

## 2022-03-30 MED ORDER — FLUTICASONE-SALMETEROL 100-50 MCG/ACT IN AEPB
1.0000 | INHALATION_SPRAY | Freq: Two times a day (BID) | RESPIRATORY_TRACT | 1 refills | Status: AC
  Filled 2022-03-30: qty 60, 30d supply, fill #0

## 2022-03-30 MED ORDER — PREDNISONE 20 MG PO TABS
20.0000 mg | ORAL_TABLET | Freq: Every day | ORAL | 0 refills | Status: AC
  Filled 2022-03-30: qty 7, 7d supply, fill #0

## 2022-03-30 MED ORDER — FLUTICASONE FUROATE-VILANTEROL 100-25 MCG/ACT IN AEPB
1.0000 | INHALATION_SPRAY | Freq: Every day | RESPIRATORY_TRACT | 1 refills | Status: DC
  Filled 2022-03-30: qty 60, 60d supply, fill #0

## 2022-03-30 NOTE — Progress Notes (Signed)
   03/30/22 1713  Departure Condition  Departure Condition Good  Mobility at [employer]  Patient/Caregiver Teaching Teach Back Method Used;Discharge instructions reviewed;Prescriptions reviewed;Follow-up care reviewed;Medications discussed;Patient/caregiver verbalized understanding  Departure Mode With significant other  Was procedural sedation performed on this patient during this visit? No   Patient alert and oriented x4, ambulatory, VS and pain stable. Patient leaving on 1L O2 with home oxygen that was delivered to unit for home use.

## 2022-03-30 NOTE — Discharge Summary (Addendum)
Antenatal Physician Discharge Summary  Patient ID: Roberta Lutz MRN: [id number] DOB/AGE: 12/20/1995 26 y.o.  Admit date: 03/24/2022 Discharge date: 03/30/2022  Admission Diagnoses: Acute bronchiolitis due to respiratory syncytial virus (RSV) [J21.0]  Discharge Diagnoses:  Acute bronchiolitis due to respiratory syncytial virus (RSV) [J21.0] 2. Pregnancy with 25 completed weeks 3. Diabetes in pregnancy  Prenatal Procedures: NST, CTPE  Consults: Pulmonary  Hospital Course:  Roberta Lutz is a 26 y.o. G2P1001 with IUP at 37w2dadmitted for worsening respiratory status with oxygen desaturations in the setting of RSV.  She was admitted for supportive measures. Pulmonary was consulted and followed the patient. She had a CT PE which was negative for PE but positive for likely viral PNA. Initially she was given IV steroids and then has been tapered to an oral steroid regimen. She was on 10L by facemask and required some prone positioning. She was able to maintain her O2 saturations like this until she progressively improved such that by day of discharge she could do ADLs with some periods off the O2 but for the majority of the time she was well controlled with sats>95-96%. Her sputum culture was negative.   The patient had a diagnosis of preexisting diabetes during her hospitalization and with the steroids, her CBGs were elevated such that I recommended Metformin 500 bid for home but she reports she is not able to tolerate so we will try Glyburide in the short term. Home oxygen was arranged and follow up with the pulmonary team. A message was sent to [facility] arrange OB follow up.   She was deemed stable for discharge to home with outpatient follow up.  Discharge Exam: Temp:  [98.5 F (36.9 C)-98.8 F (37.1 C)] 98.5 F (36.9 C) (12/15 1125) Pulse Rate:  [80-91] 90 (12/15 1125) Resp:  [18-21] 19 (12/15 1125) BP: (89-118)/(47-62) 111/62 (12/15 1125) SpO2:  [89 %-98 %] 93 % (12/15  1235) Physical Examination: CONSTITUTIONAL: Well-developed, well-nourished female in no acute distress.  HENT:  Normocephalic, atraumatic, External right and left ear normal.  EYES: Conjunctivae and EOM are normal. Pupils are equal, round, and reactive to light. No scleral icterus.  NECK: Normal range of motion, supple, no masses SKIN: Skin is warm and dry. No rash noted. Not diaphoretic. No erythema. No pallor. NEUROLOGIC: Alert and oriented to person, place, and time. Normal reflexes, muscle tone coordination. No cranial nerve deficit noted. PSYCHIATRIC: Normal mood and affect. Normal behavior. Normal judgment and thought content. CARDIOVASCULAR: Normal heart rate noted, regular rhythm RESPIRATORY: Effort and breath sounds normal, no problems with respiration noted MUSCULOSKELETAL: Normal range of motion. No edema and no tenderness. 2+ distal pulses. ABDOMEN: Soft, nontender, nondistended, gravid.   Fetal monitoring: FHR: 150 bpm, Variability: moderate, Accelerations: Present, Decelerations: Absent  Uterine activity: flat  Significant Diagnostic Studies:  Results for orders placed or performed during the hospital encounter of 03/24/22 (from the past 168 hour(s))  CBC with Differential/Platelet   Collection Time: 03/24/22  4:31 PM  Result Value Ref Range   WBC 9.4 4.0 - 10.5 K/uL   RBC 4.26 3.87 - 5.11 MIL/uL   Hemoglobin 10.9 (L) 12.0 - 15.0 g/dL   HCT 36.6 36.0 - 46.0 %   MCV 85.9 80.0 - 100.0 fL   MCH 25.6 (L) 26.0 - 34.0 pg   MCHC 29.8 (L) 30.0 - 36.0 g/dL   RDW 13.2 11.5 - 15.5 %   Platelets 290 150 - 400 K/uL   nRBC 0.0 0.0 - 0.2 %   Neutrophils  Relative % 76 %   Neutro Abs 7.1 1.7 - 7.7 K/uL   Lymphocytes Relative 13 %   Lymphs Abs 1.2 0.7 - 4.0 K/uL   Monocytes Relative 10 %   Monocytes Absolute 0.9 0.1 - 1.0 K/uL   Eosinophils Relative 0 %   Eosinophils Absolute 0.0 0.0 - 0.5 K/uL   Basophils Relative 0 %   Basophils Absolute 0.0 0.0 - 0.1 K/uL   Immature  Granulocytes 1 %   Abs Immature Granulocytes 0.05 0.00 - 0.07 K/uL  Type and screen [place]   Collection Time: 03/24/22  4:31 PM  Result Value Ref Range   ABO/RH(D) O POS    Antibody Screen NEG    Sample Expiration      03/27/2022,2359 Performed at [facility], [place] [address], [place], [place] [address]   Urinalysis, Routine w reflex microscopic Urine, Clean Catch   Collection Time: 03/24/22  4:50 PM  Result Value Ref Range   Color, Urine AMBER (A) YELLOW   APPearance CLOUDY (A) CLEAR   Specific Gravity, Urine 1.031 (H) 1.005 - 1.030   pH 5.0 5.0 - 8.0   Glucose, UA NEGATIVE NEGATIVE mg/dL   Hgb urine dipstick SMALL (A) NEGATIVE   Bilirubin Urine NEGATIVE NEGATIVE   Ketones, ur 20 (A) NEGATIVE mg/dL   Protein, ur >=300 (A) NEGATIVE mg/dL   Nitrite NEGATIVE NEGATIVE   Leukocytes,Ua NEGATIVE NEGATIVE   RBC / HPF 6-10 0 - 5 RBC/hpf   WBC, UA 6-10 0 - 5 WBC/hpf   Bacteria, UA NONE SEEN NONE SEEN   Squamous Epithelial / LPF 6-10 0 - 5   Mucus PRESENT    Amorphous Crystal PRESENT   Respiratory (~20 pathogens) panel by PCR   Collection Time: 03/24/22  5:46 PM   Specimen: Nasopharyngeal Swab; Respiratory  Result Value Ref Range   Adenovirus NOT DETECTED NOT DETECTED   Coronavirus 229E NOT DETECTED NOT DETECTED   Coronavirus HKU1 NOT DETECTED NOT DETECTED   Coronavirus NL63 NOT DETECTED NOT DETECTED   Coronavirus OC43 NOT DETECTED NOT DETECTED   Metapneumovirus NOT DETECTED NOT DETECTED   Rhinovirus / Enterovirus NOT DETECTED NOT DETECTED   Influenza A NOT DETECTED NOT DETECTED   Influenza B NOT DETECTED NOT DETECTED   Parainfluenza Virus 1 NOT DETECTED NOT DETECTED   Parainfluenza Virus 2 NOT DETECTED NOT DETECTED   Parainfluenza Virus 3 NOT DETECTED NOT DETECTED   Parainfluenza Virus 4 NOT DETECTED NOT DETECTED   Respiratory Syncytial Virus DETECTED (A) NOT DETECTED   Bordetella pertussis NOT DETECTED NOT DETECTED   Bordetella Parapertussis NOT  DETECTED NOT DETECTED   Chlamydophila pneumoniae NOT DETECTED NOT DETECTED   Mycoplasma pneumoniae NOT DETECTED NOT DETECTED  Glucose, capillary   Collection Time: 03/24/22  9:07 PM  Result Value Ref Range   Glucose-Capillary 99 70 - 99 mg/dL  Hemoglobin A1c   Collection Time: 03/25/22  5:22 AM  Result Value Ref Range   Hgb A1c MFr Bld 6.4 (H) 4.8 - 5.6 %   Mean Plasma Glucose 137 mg/dL  Comprehensive metabolic panel   Collection Time: 03/25/22  5:22 AM  Result Value Ref Range   Sodium 137 135 - 145 mmol/L   Potassium 3.5 3.5 - 5.1 mmol/L   Chloride 104 98 - 111 mmol/L   CO2 19 (L) 22 - 32 mmol/L   Glucose, Bld 153 (H) 70 - 99 mg/dL   BUN 5 (L) 6 - 20 mg/dL   Creatinine,  Ser 0.64 0.44 - 1.00 mg/dL   Calcium 8.6 (L) 8.9 - 10.3 mg/dL   Total Protein 6.9 6.5 - 8.1 g/dL   Albumin 2.8 (L) 3.5 - 5.0 g/dL   AST 30 15 - 41 U/L   ALT 17 0 - 44 U/L   Alkaline Phosphatase 63 38 - 126 U/L   Total Bilirubin 0.5 0.3 - 1.2 mg/dL   GFR, Estimated >60 >60 mL/min   Anion gap 14 5 - 15  CBC with Differential/Platelet   Collection Time: 03/25/22  5:22 AM  Result Value Ref Range   WBC 8.4 4.0 - 10.5 K/uL   RBC 4.06 3.87 - 5.11 MIL/uL   Hemoglobin 10.5 (L) 12.0 - 15.0 g/dL   HCT 32.6 (L) 36.0 - 46.0 %   MCV 80.3 80.0 - 100.0 fL   MCH 25.9 (L) 26.0 - 34.0 pg   MCHC 32.2 30.0 - 36.0 g/dL   RDW 13.1 11.5 - 15.5 %   Platelets 310 150 - 400 K/uL   nRBC 0.0 0.0 - 0.2 %   Neutrophils Relative % 84 %   Neutro Abs 7.1 1.7 - 7.7 K/uL   Lymphocytes Relative 10 %   Lymphs Abs 0.8 0.7 - 4.0 K/uL   Monocytes Relative 5 %   Monocytes Absolute 0.4 0.1 - 1.0 K/uL   Eosinophils Relative 0 %   Eosinophils Absolute 0.0 0.0 - 0.5 K/uL   Basophils Relative 0 %   Basophils Absolute 0.0 0.0 - 0.1 K/uL   Immature Granulocytes 1 %   Abs Immature Granulocytes 0.04 0.00 - 0.07 K/uL  Glucose, capillary   Collection Time: 03/25/22 11:17 AM  Result Value Ref Range   Glucose-Capillary 119 (H) 70 - 99 mg/dL   Glucose, capillary   Collection Time: 03/25/22  2:47 PM  Result Value Ref Range   Glucose-Capillary 108 (H) 70 - 99 mg/dL  D-dimer, quantitative   Collection Time: 03/25/22  5:26 PM  Result Value Ref Range   D-Dimer, Quant 1.24 (H) 0.00 - 0.50 ug/mL-FEU  Brain natriuretic peptide   Collection Time: 03/25/22  5:26 PM  Result Value Ref Range   B Natriuretic Peptide 12.2 0.0 - 100.0 pg/mL  Troponin I (High Sensitivity)   Collection Time: 03/25/22  5:26 PM  Result Value Ref Range   Troponin I (High Sensitivity) 3 <18 ng/L  Glucose, capillary   Collection Time: 03/25/22  5:57 PM  Result Value Ref Range   Glucose-Capillary 182 (H) 70 - 99 mg/dL  Glucose, capillary   Collection Time: 03/25/22 11:58 PM  Result Value Ref Range   Glucose-Capillary 123 (H) 70 - 99 mg/dL  CBC   Collection Time: 03/26/22  4:25 AM  Result Value Ref Range   WBC 12.0 (H) 4.0 - 10.5 K/uL   RBC 4.14 3.87 - 5.11 MIL/uL   Hemoglobin 10.5 (L) 12.0 - 15.0 g/dL   HCT 32.9 (L) 36.0 - 46.0 %   MCV 79.5 (L) 80.0 - 100.0 fL   MCH 25.4 (L) 26.0 - 34.0 pg   MCHC 31.9 30.0 - 36.0 g/dL   RDW 13.1 11.5 - 15.5 %   Platelets 338 150 - 400 K/uL   nRBC 0.0 0.0 - 0.2 %  Basic metabolic panel   Collection Time: 03/26/22  4:25 AM  Result Value Ref Range   Sodium 135 135 - 145 mmol/L   Potassium 3.5 3.5 - 5.1 mmol/L   Chloride 103 98 - 111 mmol/L   CO2 20 (L) 22 -  32 mmol/L   Glucose, Bld 103 (H) 70 - 99 mg/dL   BUN 5 (L) 6 - 20 mg/dL   Creatinine, Ser 0.73 0.44 - 1.00 mg/dL   Calcium 8.6 (L) 8.9 - 10.3 mg/dL   GFR, Estimated >60 >60 mL/min   Anion gap 12 5 - 15  Magnesium   Collection Time: 03/26/22  4:25 AM  Result Value Ref Range   Magnesium 2.1 1.7 - 2.4 mg/dL  Glucose, capillary   Collection Time: 03/26/22  5:58 AM  Result Value Ref Range   Glucose-Capillary 108 (H) 70 - 99 mg/dL  Glucose, capillary   Collection Time: 03/26/22  1:50 PM  Result Value Ref Range   Glucose-Capillary 169 (H) 70 - 99 mg/dL   Culture, Respiratory w Gram Stain   Collection Time: 03/26/22  5:37 PM   Specimen: Tracheal Aspirate; Respiratory  Result Value Ref Range   Specimen Description TRACHEAL ASPIRATE    Special Requests NONE    Gram Stain      FEW WBC PRESENT,BOTH PMN AND MONONUCLEAR FEW GRAM POSITIVE COCCI IN PAIRS AND CHAINS RARE GRAM NEGATIVE RODS    Culture      Normal respiratory flora-no Staph aureus or Pseudomonas seen Performed at [facility] [address], [place],  [address]    Report Status 03/29/2022 FINAL   Glucose, capillary   Collection Time: 03/26/22  7:59 PM  Result Value Ref Range   Glucose-Capillary 154 (H) 70 - 99 mg/dL  Glucose, capillary   Collection Time: 03/27/22  2:07 AM  Result Value Ref Range   Glucose-Capillary 130 (H) 70 - 99 mg/dL   Comment 1 Notify RN    Comment 2 Document in Chart   CBC   Collection Time: 03/27/22  4:54 AM  Result Value Ref Range   WBC 10.3 4.0 - 10.5 K/uL   RBC 4.12 3.87 - 5.11 MIL/uL   Hemoglobin 10.5 (L) 12.0 - 15.0 g/dL   HCT 33.0 (L) 36.0 - 46.0 %   MCV 80.1 80.0 - 100.0 fL   MCH 25.5 (L) 26.0 - 34.0 pg   MCHC 31.8 30.0 - 36.0 g/dL   RDW 12.9 11.5 - 15.5 %   Platelets 368 150 - 400 K/uL   nRBC 0.0 0.0 - 0.2 %  Basic metabolic panel   Collection Time: 03/27/22  4:54 AM  Result Value Ref Range   Sodium 137 135 - 145 mmol/L   Potassium 3.7 3.5 - 5.1 mmol/L   Chloride 104 98 - 111 mmol/L   CO2 21 (L) 22 - 32 mmol/L   Glucose, Bld 104 (H) 70 - 99 mg/dL   BUN 10 6 - 20 mg/dL   Creatinine, Ser 0.64 0.44 - 1.00 mg/dL   Calcium 8.7 (L) 8.9 - 10.3 mg/dL   GFR, Estimated >60 >60 mL/min   Anion gap 12 5 - 15  Glucose, capillary   Collection Time: 03/27/22  8:08 AM  Result Value Ref Range   Glucose-Capillary 112 (H) 70 - 99 mg/dL  Glucose, capillary   Collection Time: 03/27/22 12:07 PM  Result Value Ref Range   Glucose-Capillary 208 (H) 70 - 99 mg/dL  Glucose, capillary   Collection Time: 03/27/22  4:18 PM  Result  Value Ref Range   Glucose-Capillary 141 (H) 70 - 99 mg/dL  Glucose, capillary   Collection Time: 03/27/22  8:26 PM  Result Value Ref Range   Glucose-Capillary 102 (H) 70 - 99 mg/dL   Comment 1 Notify  RN   Glucose, capillary   Collection Time: 03/28/22 12:04 AM  Result Value Ref Range   Glucose-Capillary 101 (H) 70 - 99 mg/dL  Glucose, capillary   Collection Time: 03/28/22  4:16 AM  Result Value Ref Range   Glucose-Capillary 94 70 - 99 mg/dL  Glucose, capillary   Collection Time: 03/28/22  8:18 AM  Result Value Ref Range   Glucose-Capillary 119 (H) 70 - 99 mg/dL  Glucose, capillary   Collection Time: 03/28/22 12:29 PM  Result Value Ref Range   Glucose-Capillary 230 (H) 70 - 99 mg/dL  Glucose, capillary   Collection Time: 03/28/22  4:25 PM  Result Value Ref Range   Glucose-Capillary 148 (H) 70 - 99 mg/dL  Glucose, capillary   Collection Time: 03/28/22  8:23 PM  Result Value Ref Range   Glucose-Capillary 86 70 - 99 mg/dL  Glucose, capillary   Collection Time: 03/29/22 12:03 AM  Result Value Ref Range   Glucose-Capillary 110 (H) 70 - 99 mg/dL  Glucose, capillary   Collection Time: 03/29/22  4:06 AM  Result Value Ref Range   Glucose-Capillary 102 (H) 70 - 99 mg/dL  Glucose, capillary   Collection Time: 03/29/22  8:09 AM  Result Value Ref Range   Glucose-Capillary 116 (H) 70 - 99 mg/dL  Glucose, capillary   Collection Time: 03/29/22 12:10 PM  Result Value Ref Range   Glucose-Capillary 159 (H) 70 - 99 mg/dL  Glucose, capillary   Collection Time: 03/29/22  4:16 PM  Result Value Ref Range   Glucose-Capillary 208 (H) 70 - 99 mg/dL  Glucose, capillary   Collection Time: 03/29/22  8:50 PM  Result Value Ref Range   Glucose-Capillary 102 (H) 70 - 99 mg/dL   Comment 1 Notify RN   Glucose, capillary   Collection Time: 03/30/22 12:09 AM  Result Value Ref Range   Glucose-Capillary 155 (H) 70 - 99 mg/dL   Comment 1 Notify RN   Glucose, capillary   Collection Time: 03/30/22   4:08 AM  Result Value Ref Range   Glucose-Capillary 89 70 - 99 mg/dL   Comment 1 Notify RN   Glucose, capillary   Collection Time: 03/30/22  8:07 AM  Result Value Ref Range   Glucose-Capillary 128 (H) 70 - 99 mg/dL  Glucose, capillary   Collection Time: 03/30/22 12:08 PM  Result Value Ref Range   Glucose-Capillary 173 (H) 70 - 99 mg/dL   CT Angio Chest Pulmonary Embolism (PE) W or WO Contrast  Result Date: 03/26/2022 CLINICAL DATA:  Shortness of breath, pregnancy with RSV, initial encounter EXAM: CT ANGIOGRAPHY CHEST WITH CONTRAST TECHNIQUE: Multidetector CT imaging of the chest was performed using the standard protocol during bolus administration of intravenous contrast. Multiplanar CT image reconstructions and MIPs were obtained to evaluate the vascular anatomy. RADIATION DOSE REDUCTION: This exam was performed according to the departmental dose-optimization program which includes automated exposure control, adjustment of the mA and/or kV according to patient size and/or use of iterative reconstruction technique. CONTRAST:  5m OMNIPAQUE IOHEXOL 350 MG/ML SOLN COMPARISON:  Chest x-ray from earlier in the same day. FINDINGS: Cardiovascular: Thoracic aorta and its branches are within normal limits. No aneurysmal dilatation or dissection is noted. No cardiac enlargement is seen. Pulmonary artery is well visualized within normal branching pattern. No definitive filling defect to suggest pulmonary embolism is noted although the lower lobe branches are suboptimally opacified. Mediastinum/Nodes: Thoracic inlet is within normal limits. No sizable mediastinal adenopathy is noted. Small likely reactive hilar lymph  nodes are noted bilaterally. The esophagus as visualized is within normal limits. Lungs/Pleura: Lungs are well aerated bilaterally. Patchy infiltrate is noted diffusely throughout both lungs but worst in the lower lobes. This is consistent with multifocal pneumonia. No sizable effusion is seen.  Upper Abdomen: Visualized upper abdomen is unremarkable. Musculoskeletal: No acute bony abnormality is noted. Review of the MIP images confirms the above findings. IMPRESSION: No evidence of pulmonary emboli. Patchy bilateral infiltrates with reactive adenopathy in the hila consistent with multifocal pneumonia. Electronically Signed   By: Inez Catalina M.D.   On: 03/26/2022 01:49   DG Chest Port 1 View  Result Date: 03/25/2022 CLINICAL DATA:  Hypoxia. EXAM: PORTABLE CHEST 1 VIEW COMPARISON:  Radiographs dated March 24, 2022 FINDINGS: The heart size and mediastinal contours are within normal limits. Low lung volumes. Bibasilar hazy opacities suggesting atelectasis or infiltrate. The visualized skeletal structures are unremarkable. IMPRESSION: Bibasilar hazy opacities suggesting atelectasis or infiltrate. Follow-up examination with lateral views and improved inspiratory effort is suggested. Electronically Signed   By: Keane Police D.O.   On: 03/25/2022 17:37   DG Chest 2 View  Result Date: 03/24/2022 CLINICAL DATA:  Cough, shortness of breath. EXAM: CHEST - 2 VIEW COMPARISON:  Chest radiograph dated January 22, 2022 FINDINGS: The heart size and mediastinal contours are within normal limits. Both lungs are clear. The visualized skeletal structures are unremarkable. IMPRESSION: No active cardiopulmonary disease. Electronically Signed   By: Keane Police D.O.   On: 03/24/2022 18:14   [place] MFM OB FOLLOW UP  Result Date: 03/23/2022 ----------------------------------------------------------------------  OBSTETRICS REPORT                       (Signed Final 03/23/2022 03:25 pm) ---------------------------------------------------------------------- Patient Info  ID #:       [id number]                          D.O.B.:  [birth year] (26 yrs)  Name:       Tonny Branch                  Visit Date: 03/23/2022 03:09 pm ---------------------------------------------------------------------- Performed By  Attending:        Valeda Malm DO       Ref. Address:     [place]  [place]  Performed By:     Eveline Keto         [facility]  Referred By:      [facility] ---------------------------------------------------------------------- Orders  #  Description                           Code        Ordered By  1  [place] MFM OB FOLLOW UP                   03888.28    Valeda Malm ----------------------------------------------------------------------  #  Order #                     Accession #                Episode #  1  [id number]                   [id number]                 [id number] ---------------------------------------------------------------------- Indications  Obesity complicating pregnancy, second         O99.212  trimester (BMI 37.92)  Antenatal follow-up for nonvisualized fetal    Z36.2  anatomy  [redacted] weeks gestation of pregnancy                Z3A.24  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 Cord Insertion:      Previously Visualized  Amniotic Fluid  AFI FV:      Within normal limits                              Largest Pocket(cm)  6.9  ---------------------------------------------------------------------- Biometry  BPD:        61  mm     G. Age:  24w 6d         63  %    CI:        72.87   %    70 - 86                                                          FL/HC:      20.9   %    18.7 - 20.9  HC:      227.2  mm     G. Age:  24w 5d         49  %    HC/AC:      1.12        1.05 - 1.21  AC:      202.7  mm     G. Age:  24w 6d         60  %    FL/BPD:     77.7   %    71 - 87  FL:       47.4  mm     G. Age:  25w 6d         83  %    FL/AC:      23.4   %    20 - 24  LV:        5.9  mm  Est. FW:     783  gm    1 lb 12 oz      82  % ---------------------------------------------------------------------- OB History  Gravidity:    2         Term:   1  Living:       1 ---------------------------------------------------------------------- Gestational Age  LMP:           25w 5d        Date:  09/24/21                  EDD:   07/01/22  U/S Today:     25w 1d                                        EDD:   07/05/22  Best:          24w 2d     Det. By:  U/S C R L  (12/19/21)    EDD:   07/11/22 ---------------------------------------------------------------------- Anatomy  Cranium:               Appears normal         Stomach:                Appears normal, left                                                                        sided  Cavum:  Appears normal         Abdomen:                Appears normal  Ventricles:            Appears normal         Kidneys:                Appear normal  Heart:                 Appears normal         Bladder:                Appears normal                         (4CH, axis, and                         situs)  Diaphragm:             Appears normal  Other:  Feet/heel visualized. ---------------------------------------------------------------------- Cervix Uterus Adnexa  Cervix  Not visualized (advanced GA >24wks)  Uterus  No abnormality visualized.  Right Ovary  Not visualized.  Left Ovary  Not visualized.  ---------------------------------------------------------------------- Comments  Follow-up growth ultrasound at 24w 2d with EDD of  07/11/2022 dated by U/S C R L  (12/19/21). Pregnancy  complicated by BMI 37.  Sonographic findings  Single intrauterine pregnancy at 24w 2d.  Observed fetal cardiac activity.  Cephalic presentation.  Interval fetal anatomy appears normal.  Fetal biometry shows the estimated fetal weight at the 82  percentile.  Amniotic fluid volume: Within normal limits. MVP: 6.9 cm.  Placenta: Anterior.  Recommendations  - F/u in 6 weeks for growth ----------------------------------------------------------------------                  Valeda Malm, DO Electronically Signed Final Report   03/23/2022 03:25 pm ----------------------------------------------------------------------   Future Appointments  Date Time Provider [place]  04/19/2022 11:30 AM Martyn Ehrich, NP [facility] None  05/04/2022  2:30 PM [facility] NURSE [facility]  05/04/2022  2:45 PM [facility] US5 [facility]  06/04/2022  1:50 PM Kerin Perna, NP [facility] None    Discharge Condition: Stable  Discharge disposition: 01-Home or Self Care       Discharge Instructions     Call MD for:   Complete by: As directed    Decreased fetal movement, contractions, and vaginal bleeding.   Call MD for:  difficulty breathing, headache or visual disturbances   Complete by: As directed    Call MD for:  persistant nausea and vomiting   Complete by: As directed    Call MD for:  redness, tenderness, or signs of infection (pain, swelling, redness, odor or green/yellow discharge around incision site)   Complete by: As directed    Call MD for:  severe uncontrolled pain   Complete by: As directed    Call MD for:  temperature >100.4   Complete by: As directed    Diet Carb Modified   Complete by: As directed    Increase activity slowly   Complete by: As directed    May shower / Bathe   Complete by: As directed        Allergies as of 03/30/2022   No Known Allergies      Medication List     STOP taking these medications    albuterol 108 (90  Base) MCG/ACT inhaler Commonly known as: VENTOLIN HFA   Diclegis 10-10 MG Tbec Generic drug: Doxylamine-Pyridoxine   ibuprofen 800 MG tablet Commonly known as: ADVIL       TAKE these medications    aspirin EC 81 MG tablet Take 1 tablet (81 mg total) by mouth daily. Swallow whole.   Blood Pressure Kit Devi 1 kit by Does not apply route once a week.   fluticasone-salmeterol 100-50 MCG/ACT Aepb Commonly known as: Advair Diskus Inhale 1 puff into the lungs 2 (two) times daily.   glyBURIDE 1.25 MG tablet Commonly known as: DIABETA Take 1 tablet (1.25 mg total) by mouth daily with breakfast.   [place] Weight Scale Misc 1 Device by Does not apply route every 30 (thirty) days.   guaiFENesin 100 MG/5ML liquid Commonly known as: ROBITUSSIN Take 10 mLs by mouth every 4 (four) hours as needed for cough or to loosen phlegm.   ipratropium-albuterol 0.5-2.5 (3) MG/3ML Soln Commonly known as: DUONEB Take 3 mLs by nebulization every 4 (four) hours as needed.   metFORMIN 500 MG tablet Commonly known as: GLUCOPHAGE Take 1 tablet (500 mg total) by mouth 2 (two) times daily with a meal.   pantoprazole 20 MG tablet Commonly known as: PROTONIX Take 1 tablet (20 mg total) by mouth daily. Start taking on: March 31, 2022   predniSONE 20 MG tablet Commonly known as: DELTASONE Take 2 tablets (40 mg total) by mouth daily with breakfast for 7 days, then take 1 tablet (20 mg total) by mouth daily with breakfast for 7 days. Start taking on: March 31, 2022   predniSONE 20 MG tablet Commonly known as: DELTASONE Take 1 tablet (20 mg total) by mouth daily with breakfast for 7 days. Start taking on: April 05, 2022   prenatal multivitamin Tabs tablet Take 1 tablet by mouth daily at 12 noon.               Durable Medical Equipment  (From  admission, onward)           Start     Ordered   03/30/22 1005  For home use only DME oxygen  Once       Question Answer Comment  Length of Need 12 Months   Mode or (Route) Nasal cannula   Liters per Minute 1   Frequency Continuous (stationary and portable oxygen unit needed)   Oxygen conserving device No   Oxygen delivery system Gas      03/30/22 1005            Follow-up Information     [place] Follow up on 06/04/2022.   Why: go Feb 19th at 1:50 arrive 15 min early and bring id and insurance card on arrival. PCP appoinbtment. Appointment is with Juluis Mire NP. Contact information: [place] [id number] [phone]        Martyn Ehrich, NP Follow up on 04/19/2022.   Specialty: Pulmonary Disease Why: Your appointment is at 11:30. Contact information: [address] [place] [address] [phone]         [facility] Follow up.   Specialty: Obstetrics and Gynecology Why: They will make an appointment for you to follow up your OB care. Contact information: [address], [place] [phone]                Total discharge time: 1 hour  Signed: Radene Gunning M.D. 03/30/2022, 3:50 PM

## 2022-03-30 NOTE — Progress Notes (Addendum)
RN at bedside at 0830. Pt worked on incentive spirometry, deep breathing and cough, and movement in room. After activity and incentive, pt at 88%  on room air and RN increased to 2L O2. O2 SAT came up to 90-91%. RN increased O2 to 4L temporarily, but was able to bring O2 back down to 1L by 0930 to maintain 94% O2. Per verbal from MD, O2 SAT to stay 94% or above.

## 2022-03-30 NOTE — Care Management Note (Signed)
Case Management Note  Patient Details  Name: Roberta Lutz MRN: [id number] Date of Birth: June 03, 1995  Subjective/Objective:                   Roberta Lutz is a 26 y.o. G2P1001 at [redacted]w[redacted]d who is admitted for RSV pneumoia   Discharge planning Services  CM Consult, Follow-up appt scheduled  Post Acute Care Choice:  Durable Medical Equipment Choice offered to:  Patient  DME Arranged:  Oxygen 1 lit per [place] DME Agency:   [facility] [place])   Additional Comments: CM spoke to patient and demographics are correct in system.  Patient lives with 18 year old and boyfriend.  Patient does drive and work.  No barriers with medications or transportation. Consult for oxygen and patient offered choice and no preference for dme. CM called Jermaine with [employer] # [phone] and gave him referral for O2 and he accepted and will deliver to hospital room with teaching portable oxygen and then arrange with patient when gets home delivery of home concentrator. CM spoke to patient regarding PCP. Patient did have PCP about 2 years ago and does not want to go back to the same one.  CM set up patient with [facility] for PCP with their 1st available appointment. Time put in follow up section for epic. Reviewed with patient and she verbalized understanding.     Gretchen Short RNC-MNN, BSN [facility]  03/30/2022, 10:20 AM

## 2022-03-31 ENCOUNTER — Other Ambulatory Visit: Payer: Self-pay | Admitting: Obstetrics and Gynecology

## 2022-03-31 ENCOUNTER — Other Ambulatory Visit: Payer: Self-pay | Admitting: Obstetrics & Gynecology

## 2022-03-31 DIAGNOSIS — J45909 Unspecified asthma, uncomplicated: Secondary | ICD-10-CM

## 2022-03-31 DIAGNOSIS — J210 Acute bronchiolitis due to respiratory syncytial virus: Secondary | ICD-10-CM

## 2022-03-31 NOTE — Progress Notes (Signed)
Orders Placed This Encounter  Procedures   For home use only DME Nebulizer machine    Order Specific Question:   Patient needs a nebulizer to treat with the following condition    Answer:   Asthma affecting pregnancy, antepartum [[id number]]    Order Specific Question:   Length of Need    Answer:   6 Months     [facility]

## 2022-04-02 ENCOUNTER — Telehealth: Payer: Self-pay | Admitting: *Deleted

## 2022-04-02 NOTE — Patient Outreach (Signed)
Transitional Care Management Follow-up Telephone Call  Trishna Cwik was discharged from [facility]), has or will have a pregnancy risk assessment at next visit, has a scheduled follow up OB appointment with the [facility] provider on 04/17/22 and Pulmonology on 04/19/22, and has been referred for appropriate case management and ongoing follow up.   Estanislado Emms RN, BSN [place]  [employer]

## 2022-04-04 ENCOUNTER — Telehealth (INDEPENDENT_AMBULATORY_CARE_PROVIDER_SITE_OTHER): Payer: Medicaid Other | Admitting: Student

## 2022-04-04 ENCOUNTER — Encounter: Payer: Self-pay | Admitting: Student

## 2022-04-04 DIAGNOSIS — O0992 Supervision of high risk pregnancy, unspecified, second trimester: Secondary | ICD-10-CM

## 2022-04-04 DIAGNOSIS — J45909 Unspecified asthma, uncomplicated: Secondary | ICD-10-CM

## 2022-04-04 DIAGNOSIS — Z3A26 26 weeks gestation of pregnancy: Secondary | ICD-10-CM

## 2022-04-04 DIAGNOSIS — O24312 Unspecified pre-existing diabetes mellitus in pregnancy, second trimester: Secondary | ICD-10-CM

## 2022-04-04 DIAGNOSIS — O24319 Unspecified pre-existing diabetes mellitus in pregnancy, unspecified trimester: Secondary | ICD-10-CM

## 2022-04-04 DIAGNOSIS — O99212 Obesity complicating pregnancy, second trimester: Secondary | ICD-10-CM

## 2022-04-04 DIAGNOSIS — O99512 Diseases of the respiratory system complicating pregnancy, second trimester: Secondary | ICD-10-CM

## 2022-04-04 DIAGNOSIS — J210 Acute bronchiolitis due to respiratory syncytial virus: Secondary | ICD-10-CM

## 2022-04-04 NOTE — Progress Notes (Signed)
Pt presents for ROB visit. Pt has cocners about returning to work after hospitalization with pneumonia and RSV. Pt requesting portable oxygen tank if insurance covers it. No other concerns at this time.

## 2022-04-04 NOTE — Progress Notes (Signed)
OBSTETRICS PRENATAL VIRTUAL VISIT ENCOUNTER NOTE  Provider location: [facility] [place] [facility]   Patient location: Home  I connected with Roberta Lutz on 04/04/22 at  3:10 PM EST by MyChart Video Encounter and verified that I am speaking with the correct person using two identifiers. I discussed the limitations, risks, security and privacy concerns of performing an evaluation and management service virtually and the availability of in person appointments. I also discussed with the patient that there may be a patient responsible charge related to this service. The patient expressed understanding and agreed to proceed. Subjective:  Roberta Lutz is a 26 y.o. G2P1001 at [redacted]w[redacted]d being seen today for ongoing prenatal care.  She is currently monitored for the following issues for this high-risk pregnancy and has Alpha thalassemia silent carrier; Cutaneous pseudolymphoma; Diabetes mellitus ([place]); Reactive lymphoid hyperplasia; Supervision of high-risk pregnancy; Asthma affecting pregnancy, antepartum; Preexisting diabetes complicating pregnancy, antepartum; Maternal morbid obesity, antepartum ([place]); and Acute bronchiolitis due to respiratory syncytial virus (RSV) on their problem list.  Patient reports  concern for being out of work due to SOB .  Would like to obtain a smaller portable option before returning to work in 1-2 weeks. Contractions: Not present. Vag. Bleeding: None.  Movement: Present. Denies any leaking of fluid.   The following portions of the patient's history were reviewed and updated as appropriate: allergies, current medications, past family history, past medical history, past social history, past surgical history and problem list.   Objective:  There were no vitals filed for this visit.  Fetal Status:     Movement: Present     General:  Alert, oriented and cooperative. Patient is in no acute distress.  Respiratory: Normal respiratory effort, no problems with  respiration noted  Mental Status: Normal mood and affect. Normal behavior. Normal judgment and thought content.  Rest of physical exam deferred due to type of encounter  Imaging: CT Angio Chest Pulmonary Embolism (PE) W or WO Contrast  Result Date: 03/26/2022 CLINICAL DATA:  Shortness of breath, pregnancy with RSV, initial encounter EXAM: CT ANGIOGRAPHY CHEST WITH CONTRAST TECHNIQUE: Multidetector CT imaging of the chest was performed using the standard protocol during bolus administration of intravenous contrast. Multiplanar CT image reconstructions and MIPs were obtained to evaluate the vascular anatomy. RADIATION DOSE REDUCTION: This exam was performed according to the departmental dose-optimization program which includes automated exposure control, adjustment of the mA and/or kV according to patient size and/or use of iterative reconstruction technique. CONTRAST:  35mL OMNIPAQUE IOHEXOL 350 MG/ML SOLN COMPARISON:  Chest x-ray from earlier in the same day. FINDINGS: Cardiovascular: Thoracic aorta and its branches are within normal limits. No aneurysmal dilatation or dissection is noted. No cardiac enlargement is seen. Pulmonary artery is well visualized within normal branching pattern. No definitive filling defect to suggest pulmonary embolism is noted although the lower lobe branches are suboptimally opacified. Mediastinum/Nodes: Thoracic inlet is within normal limits. No sizable mediastinal adenopathy is noted. Small likely reactive hilar lymph nodes are noted bilaterally. The esophagus as visualized is within normal limits. Lungs/Pleura: Lungs are well aerated bilaterally. Patchy infiltrate is noted diffusely throughout both lungs but worst in the lower lobes. This is consistent with multifocal pneumonia. No sizable effusion is seen. Upper Abdomen: Visualized upper abdomen is unremarkable. Musculoskeletal: No acute bony abnormality is noted. Review of the MIP images confirms the above findings.  IMPRESSION: No evidence of pulmonary emboli. Patchy bilateral infiltrates with reactive adenopathy in the hila consistent with multifocal pneumonia. Electronically Signed  By: Inez Catalina M.D.   On: 03/26/2022 01:49   DG Chest Port 1 View  Result Date: 03/25/2022 CLINICAL DATA:  Hypoxia. EXAM: PORTABLE CHEST 1 VIEW COMPARISON:  Radiographs dated March 24, 2022 FINDINGS: The heart size and mediastinal contours are within normal limits. Low lung volumes. Bibasilar hazy opacities suggesting atelectasis or infiltrate. The visualized skeletal structures are unremarkable. IMPRESSION: Bibasilar hazy opacities suggesting atelectasis or infiltrate. Follow-up examination with lateral views and improved inspiratory effort is suggested. Electronically Signed   By: Keane Police D.O.   On: 03/25/2022 17:37   DG Chest 2 View  Result Date: 03/24/2022 CLINICAL DATA:  Cough, shortness of breath. EXAM: CHEST - 2 VIEW COMPARISON:  Chest radiograph dated January 22, 2022 FINDINGS: The heart size and mediastinal contours are within normal limits. Both lungs are clear. The visualized skeletal structures are unremarkable. IMPRESSION: No active cardiopulmonary disease. Electronically Signed   By: Keane Police D.O.   On: 03/24/2022 18:14   [place] MFM OB FOLLOW UP  Result Date: 03/23/2022 ----------------------------------------------------------------------  OBSTETRICS REPORT                       (Signed Final 03/23/2022 03:25 pm) ---------------------------------------------------------------------- Patient Info  ID #:       [id number]                          D.O.B.:  [birth year] (26 yrs)  Name:       Roberta Lutz                  Visit Date: 03/23/2022 03:09 pm ---------------------------------------------------------------------- Performed By  Attending:        Valeda Malm DO       Ref. Address:     [address]                                                              [address] [place]                                                             Person  Performed By:     Eveline Keto         [facility]         [facility]                                                             [place] [facility]                                                             [facility]  Referred By:      [facility] ---------------------------------------------------------------------- Orders  #  Description                           Code        Ordered By  1  [place] MFM OB FOLLOW UP                   [id number]    Valeda Malm ----------------------------------------------------------------------  #  Order #                     Accession #                Episode #  1  [id number]                   [id number]                 [id number] ---------------------------------------------------------------------- Indications  Obesity complicating pregnancy, second         O99.212  trimester (BMI 37.92)  Antenatal follow-up for nonvisualized fetal    Z36.2  anatomy  [redacted] weeks gestation of pregnancy                Z3A.24  Genetic carrier (alpha thal )                Z14.8  LR-NIPS/Neg AFP ---------------------------------------------------------------------- Vital Signs  BP:          120/58 ---------------------------------------------------------------------- Fetal Evaluation  Num Of Fetuses:         1  Fetal Heart Rate(bpm):  167  Cardiac Activity:       Observed  Presentation:           Cephalic  Placenta:               Anterior  P. Cord Insertion:      Previously Visualized  Amniotic Fluid  AFI FV:      Within normal limits                              Largest Pocket(cm)                              6.9 ---------------------------------------------------------------------- Biometry  BPD:        61  mm     G. Age:  24w 6d  63  %    CI:        72.87   %    70  - 86                                                          FL/HC:      20.9   %    18.7 - 20.9  HC:      227.2  mm     G. Age:  24w 5d         49  %    HC/AC:      1.12        1.05 - 1.21  AC:      202.7  mm     G. Age:  24w 6d         60  %    FL/BPD:     77.7   %    71 - 87  FL:       47.4  mm     G. Age:  25w 6d         83  %    FL/AC:      23.4   %    20 - 24  LV:        5.9  mm  Est. FW:     783  gm    1 lb 12 oz      82  % ---------------------------------------------------------------------- OB History  Gravidity:    2         Term:   1  Living:       1 ---------------------------------------------------------------------- Gestational Age  LMP:           25w 5d        Date:  09/24/21                  EDD:   07/01/22  U/S Today:     25w 1d                                        EDD:   07/05/22  Best:          24w 2d     Det. By:  U/S C R L  (12/19/21)    EDD:   07/11/22 ---------------------------------------------------------------------- Anatomy  Cranium:               Appears normal         Stomach:                Appears normal, left                                                                        sided  Cavum:                 Appears normal         Abdomen:  Appears normal  Ventricles:            Appears normal         Kidneys:                Appear normal  Heart:                 Appears normal         Bladder:                Appears normal                         (4CH, axis, and                         situs)  Diaphragm:             Appears normal  Other:  Feet/heel visualized. ---------------------------------------------------------------------- Cervix Uterus Adnexa  Cervix  Not visualized (advanced GA >24wks)  Uterus  No abnormality visualized.  Right Ovary  Not visualized.  Left Ovary  Not visualized. ---------------------------------------------------------------------- Comments  Follow-up growth ultrasound at 24w 2d with EDD of  07/11/2022 dated by U/S C R L  (12/19/21).  Pregnancy  complicated by BMI 37.  Sonographic findings  Single intrauterine pregnancy at 24w 2d.  Observed fetal cardiac activity.  Cephalic presentation.  Interval fetal anatomy appears normal.  Fetal biometry shows the estimated fetal weight at the 82  percentile.  Amniotic fluid volume: Within normal limits. MVP: 6.9 cm.  Placenta: Anterior.  Recommendations  - F/u in 6 weeks for growth ----------------------------------------------------------------------                  Valeda Malm, DO Electronically Signed Final Report   03/23/2022 03:25 pm ----------------------------------------------------------------------   Assessment and Plan:  Pregnancy: G2P1001 at [redacted]w[redacted]d 1. Supervision of high risk pregnancy in second trimester - Discussed f/u plan with OB and Pulmonary  - Reports consistent fetal movement  2. Preexisting diabetes complicating pregnancy, antepartum - Taking Gylburide BID  3. Maternal morbid obesity, antepartum ([place]) - Follow-up growth scan scheduled   4. Acute bronchiolitis due to respiratory syncytial virus (RSV) 5. Asthma complicating pregnancy, antepartum - Feeling better with BID inhaler and intermittent oxygen use - Would like a portable option for returning back to work - F/U with pulmonology in 2 weeks   Preterm labor symptoms and general obstetric precautions including but not limited to vaginal bleeding, contractions, leaking of fluid and fetal movement were reviewed in detail with the patient. I discussed the assessment and treatment plan with the patient. The patient was provided an opportunity to ask questions and all were answered. The patient agreed with the plan and demonstrated an understanding of the instructions. The patient was advised to call back or seek an in-person office evaluation/go to MAU at [facility] for any urgent or concerning symptoms. Please refer to After Visit Summary for other counseling recommendations.   I provided 10  minutes of face-to-face time during this encounter.  Return in about 2 weeks (around 04/18/2022) for [facility].  Future Appointments  Date Time Provider [place]  04/17/2022 11:15 AM Woodroe Mode, MD [facility] None  04/19/2022 11:30 AM Martyn Ehrich, NP [facility] None  05/04/2022  2:30 PM [facility] NURSE [facility]  05/04/2022  2:45 PM [facility] US5 [facility]  06/04/2022  1:50 PM Kerin Perna, NP [facility] None    Elmyra Ricks  Chanda Busing, [place] for [employer], [place]

## 2022-04-06 ENCOUNTER — Telehealth: Payer: Self-pay | Admitting: Internal Medicine

## 2022-04-06 NOTE — Telephone Encounter (Signed)
Called patient but she did not answer and her VM is full. Order was sent to [facility]. Patient will need to call [employer] and follow up with them in regards to her POC.

## 2022-04-06 NOTE — Telephone Encounter (Signed)
Returning our call. Please call her back to clarify what Dr's were saying. TY

## 2022-04-06 NOTE — Telephone Encounter (Signed)
Patient called and states that her OB has been trying to contact our office with no success. NP, Corlis Hove needs to speak with our doctors to discuss getting a smaller O2 tank before 12/27. Patient was seen by DS in hospital not clinic. Please call patient back with update.

## 2022-04-11 ENCOUNTER — Telehealth: Payer: Self-pay | Admitting: Primary Care

## 2022-04-11 ENCOUNTER — Encounter: Payer: Self-pay | Admitting: Student

## 2022-04-11 NOTE — Telephone Encounter (Signed)
Called and spoke with pt letting her know that she will need to be seen for the hospital follow up appt prior to her being able to be cleared for work by our office due to her recent hospital stay. Stated to pt that she could check with her OB to see if they would be willing to clear her to be able to go back to work and she verbalized understanding. Nothing further needed.

## 2022-04-12 ENCOUNTER — Encounter (HOSPITAL_COMMUNITY): Payer: Self-pay | Admitting: Obstetrics and Gynecology

## 2022-04-12 ENCOUNTER — Inpatient Hospital Stay (HOSPITAL_COMMUNITY)
Admission: AD | Admit: 2022-04-12 | Discharge: 2022-04-12 | Disposition: A | Discharge status: Home / Self Care | Payer: Medicaid Other | Attending: Obstetrics and Gynecology | Admitting: Obstetrics and Gynecology

## 2022-04-12 DIAGNOSIS — Z3A27 27 weeks gestation of pregnancy: Secondary | ICD-10-CM | POA: Insufficient documentation

## 2022-04-12 DIAGNOSIS — R102 Pelvic and perineal pain: Secondary | ICD-10-CM | POA: Insufficient documentation

## 2022-04-12 DIAGNOSIS — O4702 False labor before 37 completed weeks of gestation, second trimester: Secondary | ICD-10-CM | POA: Diagnosis not present

## 2022-04-12 DIAGNOSIS — O24112 Pre-existing diabetes mellitus, type 2, in pregnancy, second trimester: Secondary | ICD-10-CM | POA: Insufficient documentation

## 2022-04-12 DIAGNOSIS — Z7984 Long term (current) use of oral hypoglycemic drugs: Secondary | ICD-10-CM | POA: Insufficient documentation

## 2022-04-12 DIAGNOSIS — O479 False labor, unspecified: Secondary | ICD-10-CM

## 2022-04-12 DIAGNOSIS — O26892 Other specified pregnancy related conditions, second trimester: Secondary | ICD-10-CM | POA: Insufficient documentation

## 2022-04-12 LAB — URINALYSIS, ROUTINE W REFLEX MICROSCOPIC
Bilirubin Urine: NEGATIVE
Glucose, UA: >=500 mg/dL — AB
Hgb urine dipstick: NEGATIVE
Ketones, ur: NEGATIVE mg/dL
Leukocytes,Ua: NEGATIVE
Nitrite: NEGATIVE
Protein, ur: 30 mg/dL — AB
Specific Gravity, Urine: 1.037 — ABNORMAL HIGH (ref 1.005–1.030)
pH: 5 (ref 5.0–8.0)

## 2022-04-12 NOTE — MAU Note (Signed)
.  Roberta Lutz is a 26 y.o. at [redacted]w[redacted]d here in MAU reporting: contractions starting midday today, and pressure in butt area with urge to push. Patient reports has had 4 contractions today that each lasted 2-42minutes. Denies LOF, VB and reports +FM.   Onset of complaint: 12/28 Pain score: 6/10 Vitals:   04/12/22 1808  BP: 110/78  Pulse: (!) 113  Temp: 98.1 F (36.7 C)  SpO2: 100%     FHT:170 Lab orders placed from triage:  UA

## 2022-04-12 NOTE — MAU Provider Note (Signed)
History     CSN: [id number]  Arrival date and time: 04/12/22 1750   None     Chief Complaint  Patient presents with   Abdominal Pain   Pelvic Pain   HPI Roberta Lutz is a 26 y.o. G2P1001 at 82w1dwho presents to MAU for contractions. She reports 4 contractions total today, all lasting 2-3 minutes. With every contraction she had the urge to push. She called OB office and reports they told her to come in for evaluation. She denies a history of PT. No vaginal bleeding, no leaking fluid or discharge. She is not having vaginal itching, odor, or urinary s/s. She reports last BM was today and denies constipation. No recent IC or cervical exams. She endorses active fetal movement.   [facility].   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   Rosacea     Past Surgical History:  Procedure Laterality Date   DRUG INDUCED ENDOSCOPY     Gastritis    Family History  Problem Relation Age of Onset   Diabetes Mother    Arthritis Mother    Birth defects Brother    ADD / ADHD Brother    Asthma Brother    Diabetes Maternal Grandmother    Arthritis Maternal Grandmother    Cancer Maternal Grandfather     Social History   Tobacco Use   Smoking status: Never    Passive exposure: Never   Smokeless tobacco: Never  Vaping Use   Vaping Use: Never used  Substance Use Topics   Alcohol use: Not Currently    Alcohol/week: 2.0 standard drinks of alcohol    Types: 2 Glasses of wine per week     Comment: 3-4 times per week prior to confirmed pregnancy   Drug use: No    Allergies: No Known Allergies  Medications Prior to Admission  Medication Sig Dispense Refill Last Dose   glyBURIDE (DIABETA) 2.5 MG tablet Take 0.5 tablets (1.25 mg total) by mouth daily with breakfast. 15 tablet 1 04/12/2022   pantoprazole (PROTONIX) 20 MG tablet Take 1 tablet (20 mg total) by mouth daily. 30 tablet 0 04/12/2022   predniSONE (DELTASONE) 20 MG tablet Take 2 tablets (40 mg total) by mouth daily with breakfast for 7 days, then take 1 tablet (20 mg total) by mouth daily with breakfast for 7 days. 21 tablet 0 04/12/2022   Prenatal Vit-Fe Fumarate-FA (PRENATAL MULTIVITAMIN) TABS tablet Take 1 tablet by mouth daily at 12 noon.   04/12/2022   aspirin EC 81 MG tablet Take 1 tablet (81 mg total) by mouth daily. Swallow whole. (Patient not taking: Reported on 02/16/2022) 30 tablet 12    Blood Pressure Monitoring (BLOOD  PRESSURE KIT) DEVI 1 kit by Does not apply route once a week. (Patient not taking: Reported on 03/23/2022) 1 each 0    fluticasone-salmeterol (ADVAIR DISKUS) 100-50 MCG/ACT AEPB Inhale 1 puff into the lungs 2 (two) times daily. 60 each 1    guaiFENesin (ROBITUSSIN) 100 MG/5ML liquid Take 10 mLs by mouth every 4 (four) hours as needed for cough or to loosen phlegm. (Patient not taking: Reported on 04/04/2022) 120 mL 0    ipratropium-albuterol (DUONEB) 0.5-2.5 (3) MG/3ML SOLN Take 3 mLs by nebulization every 4 (four) hours as needed. 360 mL 0    metFORMIN (GLUCOPHAGE) 500 MG tablet Take 1 tablet (500 mg total) by mouth 2 (two) times daily with a meal. 60 tablet 5    Misc. Devices (GOJJI WEIGHT SCALE) MISC 1 Device by Does not apply route every 30 (thirty) days. (Patient not taking: Reported on 03/23/2022) 1 each 0    predniSONE (DELTASONE) 20 MG tablet Take 1 tablet (20 mg total) by mouth daily with breakfast for 7 days. 7 tablet 0    Review of Systems  Constitutional: Negative.   Respiratory:  Negative.    Cardiovascular: Negative.   Gastrointestinal:  Positive for abdominal pain.       Rectal pressure  Genitourinary: Negative.   Musculoskeletal: Negative.   Neurological: Negative.    Physical Exam   Blood pressure 134/73, pulse 87, temperature 98.1 F (36.7 C), temperature source Oral, height _0  (1.575 m), weight 99.8 kg, last menstrual period 09/24/2021, SpO2 99 %.  Physical Exam Vitals and nursing note reviewed. Exam conducted with a chaperone present.  Constitutional:      General: She is not in acute distress.    Appearance: She is obese.  Cardiovascular:     Rate and Rhythm: Tachycardia present.  Pulmonary:     Effort: Pulmonary effort is normal.  Abdominal:     Palpations: Abdomen is soft.     Tenderness: There is no abdominal tenderness.     Comments: Gravid    Neurological:     General: No focal deficit present.     Mental Status: She is alert and oriented to person, place, and time.  Psychiatric:        Mood and Affect: Mood normal.        Behavior: Behavior normal.   Dilation: Closed Effacement (%): Thick Cervical Position: Posterior Presentation: Undeterminable Exam by:: Maryagnes Amos, CNM   Results for orders placed or performed during the hospital encounter of 04/12/22 (from the past 24 hour(s))  Urinalysis, Routine w reflex microscopic Urine, Clean Catch     Status: Abnormal   Collection Time: 04/12/22  6:17 PM  Result Value Ref Range   Color, Urine YELLOW YELLOW   APPearance HAZY (A) CLEAR   Specific Gravity, Urine 1.037 (H) 1.005 - 1.030   pH 5.0 5.0 - 8.0   Glucose, UA >=500 (A) NEGATIVE mg/dL   Hgb urine dipstick NEGATIVE NEGATIVE   Bilirubin Urine NEGATIVE NEGATIVE   Ketones, ur NEGATIVE NEGATIVE mg/dL   Protein, ur 30 (A) NEGATIVE mg/dL   Nitrite NEGATIVE NEGATIVE   Leukocytes,Ua NEGATIVE NEGATIVE   RBC / HPF 0-5 0 - 5 RBC/hpf   WBC, UA 0-5 0 - 5 WBC/hpf   Bacteria, UA RARE (A) NONE SEEN   Squamous Epithelial / LPF 21-50  0 - 5 /HPF   Mucus PRESENT    NST FHR: 155 bpm, moderate variability, +15x15 accels, no decels Toco: quiet  MAU Course  Procedures  MDM UA with increased specific gravity as well as glucose. Patient is a known T2DM. FFN collected prior to cervical exam however not sent as cervix is closed/thick and patient not contracting. FHR initially slightly tachycardic with a baseline of 165. Patient was given PO hydration and baseline settled to the 150s. Moderate variability with accels present. Decels absent.  Assessment and Plan  [redacted] weeks gestation of pregnancy Braxton hicks contractions  - Discharge home in stable condition - Return precautions given. Return to MAU as needed for new/worsening symptoms - Keep OB appointment as scheduled on 04/17/2022.   Renee Harder, CNM 04/12/2022, 8:01 PM

## 2022-04-16 DIAGNOSIS — Z419 Encounter for procedure for purposes other than remedying health state, unspecified: Secondary | ICD-10-CM | POA: Diagnosis not present

## 2022-04-16 NOTE — L&D Delivery Note (Signed)
OB/GYN Faculty Practice Delivery Note  Roberta Lutz is a 27 y.o. G2P1001 s/p VD at [redacted]w[redacted]d She was admitted for SOL.   ROM: 0h 144mith clear fluid GBS Status:  Negative/-- (02/29 1041) Maximum Maternal Temperature: 97.6  Labor Progress: Initial SVE: 5.5/100/-1. She then progressed to complete.   Delivery Date/Time: 06/28/2022 '@1350'$  Delivery: Called to room and patient was complete and pushing. Head delivered ROA. No nuchal cord present. Shoulder and body delivered in usual fashion. Infant with spontaneous cry, placed on mother's abdomen, dried and stimulated. Cord clamped x 2 after 40-50 second delay due to minimal spontaneous cry, and cut by FOB. Cord ph and cord blood drawn. Infant taken over to warmer for further assessment. The infant did not need oxygen or extra stimulation; continual spontaneous cry going over to warmer.  Placenta delivered spontaneously with gentle cord traction. Fundus firm with massage and Pitocin. Labia, perineum, vagina, and cervix inspected. No laceration.   Baby Weight: pending  Placenta: Multiple calcifications, 3 vessel, intact. Sent to L&D Complications: None Lacerations: None EBL: 74 mL Analgesia: Nitrous  Infant:  APGAR (1 MIN):  7 APGAR (5 MINS): 9   Rosela Supak Autry-Lott, DO OB Fellow, [facility] [employer]/14/2024, 2:13 PM

## 2022-04-17 ENCOUNTER — Encounter: Payer: Medicaid Other | Admitting: Obstetrics & Gynecology

## 2022-04-17 NOTE — Telephone Encounter (Signed)
Patient called and canceled appt on 1/4- did not want to reschedule. States she does not need oxygen anymore.

## 2022-04-19 ENCOUNTER — Inpatient Hospital Stay: Payer: Medicaid Other | Admitting: Primary Care

## 2022-04-25 ENCOUNTER — Telehealth: Payer: Self-pay

## 2022-04-25 NOTE — Telephone Encounter (Signed)
Called pt to inform her that her FMLA paperwork has been sent.

## 2022-04-27 ENCOUNTER — Telehealth: Payer: Self-pay

## 2022-04-27 MED ORDER — PANTOPRAZOLE SODIUM 20 MG PO TBEC: 20.0000 mg | DELAYED_RELEASE_TABLET | Freq: Every day | ORAL | 0 refills | Status: DC

## 2022-04-27 NOTE — Telephone Encounter (Signed)
Returned call, pt stated that she is having issues with acid reflux and insomnia. Consulted with in office provider and advised pt that rx would be sent for reflux and adcvised of otc meds for insomnia

## 2022-04-30 DIAGNOSIS — O24111 Pre-existing diabetes mellitus, type 2, in pregnancy, first trimester: Secondary | ICD-10-CM | POA: Diagnosis not present

## 2022-04-30 DIAGNOSIS — J210 Acute bronchiolitis due to respiratory syncytial virus: Secondary | ICD-10-CM | POA: Diagnosis not present

## 2022-04-30 DIAGNOSIS — O0900 Supervision of pregnancy with history of infertility, unspecified trimester: Secondary | ICD-10-CM | POA: Diagnosis not present

## 2022-04-30 DIAGNOSIS — O9952 Diseases of the respiratory system complicating childbirth: Secondary | ICD-10-CM | POA: Diagnosis not present

## 2022-05-04 ENCOUNTER — Ambulatory Visit: Payer: Medicaid Other | Attending: Maternal & Fetal Medicine

## 2022-05-04 ENCOUNTER — Ambulatory Visit: Payer: Medicaid Other | Admitting: *Deleted

## 2022-05-04 ENCOUNTER — Other Ambulatory Visit: Payer: Self-pay | Admitting: Maternal & Fetal Medicine

## 2022-05-04 VITALS — BP 96/74 | HR 93

## 2022-05-04 DIAGNOSIS — O285 Abnormal chromosomal and genetic finding on antenatal screening of mother: Secondary | ICD-10-CM | POA: Diagnosis not present

## 2022-05-04 DIAGNOSIS — O09299 Supervision of pregnancy with other poor reproductive or obstetric history, unspecified trimester: Secondary | ICD-10-CM

## 2022-05-04 DIAGNOSIS — O24319 Unspecified pre-existing diabetes mellitus in pregnancy, unspecified trimester: Secondary | ICD-10-CM | POA: Insufficient documentation

## 2022-05-04 DIAGNOSIS — O0992 Supervision of high risk pregnancy, unspecified, second trimester: Secondary | ICD-10-CM | POA: Diagnosis not present

## 2022-05-04 DIAGNOSIS — Z3A30 30 weeks gestation of pregnancy: Secondary | ICD-10-CM | POA: Diagnosis not present

## 2022-05-04 DIAGNOSIS — O403XX0 Polyhydramnios, third trimester, not applicable or unspecified: Secondary | ICD-10-CM | POA: Diagnosis not present

## 2022-05-04 DIAGNOSIS — D563 Thalassemia minor: Secondary | ICD-10-CM | POA: Diagnosis not present

## 2022-05-04 DIAGNOSIS — O09293 Supervision of pregnancy with other poor reproductive or obstetric history, third trimester: Secondary | ICD-10-CM | POA: Diagnosis not present

## 2022-05-04 DIAGNOSIS — O99210 Obesity complicating pregnancy, unspecified trimester: Secondary | ICD-10-CM | POA: Insufficient documentation

## 2022-05-04 DIAGNOSIS — E119 Type 2 diabetes mellitus without complications: Secondary | ICD-10-CM | POA: Diagnosis not present

## 2022-05-04 DIAGNOSIS — E669 Obesity, unspecified: Secondary | ICD-10-CM

## 2022-05-04 DIAGNOSIS — O24113 Pre-existing diabetes mellitus, type 2, in pregnancy, third trimester: Secondary | ICD-10-CM

## 2022-05-04 DIAGNOSIS — Z7984 Long term (current) use of oral hypoglycemic drugs: Secondary | ICD-10-CM

## 2022-05-04 DIAGNOSIS — O99213 Obesity complicating pregnancy, third trimester: Secondary | ICD-10-CM

## 2022-05-07 ENCOUNTER — Other Ambulatory Visit: Payer: Self-pay | Admitting: *Deleted

## 2022-05-07 DIAGNOSIS — O24113 Pre-existing diabetes mellitus, type 2, in pregnancy, third trimester: Secondary | ICD-10-CM

## 2022-05-11 ENCOUNTER — Ambulatory Visit: Payer: Medicaid Other | Admitting: *Deleted

## 2022-05-11 ENCOUNTER — Ambulatory Visit (INDEPENDENT_AMBULATORY_CARE_PROVIDER_SITE_OTHER): Payer: Medicaid Other | Admitting: Obstetrics & Gynecology

## 2022-05-11 ENCOUNTER — Other Ambulatory Visit: Payer: Medicaid Other

## 2022-05-11 ENCOUNTER — Ambulatory Visit: Payer: Medicaid Other | Attending: Obstetrics

## 2022-05-11 ENCOUNTER — Encounter: Payer: Self-pay | Admitting: Obstetrics & Gynecology

## 2022-05-11 VITALS — BP 111/79 | HR 116

## 2022-05-11 VITALS — BP 127/77 | HR 112 | Wt 227.0 lb

## 2022-05-11 DIAGNOSIS — O24113 Pre-existing diabetes mellitus, type 2, in pregnancy, third trimester: Secondary | ICD-10-CM

## 2022-05-11 DIAGNOSIS — O285 Abnormal chromosomal and genetic finding on antenatal screening of mother: Secondary | ICD-10-CM | POA: Diagnosis not present

## 2022-05-11 DIAGNOSIS — E669 Obesity, unspecified: Secondary | ICD-10-CM | POA: Diagnosis not present

## 2022-05-11 DIAGNOSIS — O99210 Obesity complicating pregnancy, unspecified trimester: Secondary | ICD-10-CM

## 2022-05-11 DIAGNOSIS — O0993 Supervision of high risk pregnancy, unspecified, third trimester: Secondary | ICD-10-CM

## 2022-05-11 DIAGNOSIS — O403XX0 Polyhydramnios, third trimester, not applicable or unspecified: Secondary | ICD-10-CM

## 2022-05-11 DIAGNOSIS — O99213 Obesity complicating pregnancy, third trimester: Secondary | ICD-10-CM

## 2022-05-11 DIAGNOSIS — Z3A20 20 weeks gestation of pregnancy: Secondary | ICD-10-CM

## 2022-05-11 DIAGNOSIS — O24313 Unspecified pre-existing diabetes mellitus in pregnancy, third trimester: Secondary | ICD-10-CM

## 2022-05-11 DIAGNOSIS — O24319 Unspecified pre-existing diabetes mellitus in pregnancy, unspecified trimester: Secondary | ICD-10-CM

## 2022-05-11 DIAGNOSIS — Z3A31 31 weeks gestation of pregnancy: Secondary | ICD-10-CM

## 2022-05-11 DIAGNOSIS — O24112 Pre-existing diabetes mellitus, type 2, in pregnancy, second trimester: Secondary | ICD-10-CM

## 2022-05-11 DIAGNOSIS — D563 Thalassemia minor: Secondary | ICD-10-CM | POA: Diagnosis not present

## 2022-05-11 DIAGNOSIS — E119 Type 2 diabetes mellitus without complications: Secondary | ICD-10-CM | POA: Diagnosis not present

## 2022-05-11 MED ORDER — METOCLOPRAMIDE HCL 5 MG PO TABS: 5.0000 mg | ORAL_TABLET | Freq: Four times a day (QID) | ORAL | 1 refills | Status: DC | PRN

## 2022-05-11 NOTE — Progress Notes (Signed)
Pt states heartburn med is not working.  Pt still not able to sleep well - ?ambien Rx.  Pt is currently using Diclegis and unisom.

## 2022-05-11 NOTE — Progress Notes (Signed)
   PRENATAL VISIT NOTE  Subjective:  Roberta Lutz is a 27 y.o. G2P1001 at [redacted]w[redacted]d being seen today for ongoing prenatal care.  She is currently monitored for the following issues for this high-risk pregnancy and has Alpha thalassemia silent carrier; Cutaneous pseudolymphoma; Diabetes mellitus ([place]); Reactive lymphoid hyperplasia; Supervision of high-risk pregnancy; Asthma affecting pregnancy, antepartum; Preexisting diabetes complicating pregnancy, antepartum; Maternal morbid obesity, antepartum ([place]); and Acute bronchiolitis due to respiratory syncytial virus (RSV) on their problem list.  Patient reports heartburn, nausea, and vomiting.  Contractions: Irregular. Vag. Bleeding: None.  Movement: Present. Denies leaking of fluid.   The following portions of the patient's history were reviewed and updated as appropriate: allergies, current medications, past family history, past medical history, past social history, past surgical history and problem list.   Objective:   Vitals:   05/11/22 0856  BP: 127/77  Pulse: (!) 112  Weight: 227 lb (103 kg)    Fetal Status: Fetal Heart Rate (bpm): 155   Movement: Present     General:  Alert, oriented and cooperative. Patient is in no acute distress.  Skin: Skin is warm and dry. No rash noted.   Cardiovascular: Normal heart rate noted  Respiratory: Normal respiratory effort, no problems with respiration noted  Abdomen: Soft, gravid, appropriate for gestational age.  Pain/Pressure: Present     Pelvic: Cervical exam deferred        Extremities: Normal range of motion.  Edema: None  Mental Status: Normal mood and affect. Normal behavior. Normal judgment and thought content.   Assessment and Plan:  Pregnancy: G2P1001 at [redacted]w[redacted]d 1. Maternal morbid obesity, antepartum ([place]) Body mass index is 41.52 kg/m.    2. Preexisting diabetes complicating pregnancy, antepartum States dx is uncertain and requests screening GTT today  3. Supervision of high risk  pregnancy in third trimester  - CBC - RPR - HIV Antibody (routine testing w rflx) - metoCLOPramide (REGLAN) 5 MG tablet; Take 1 tablet (5 mg total) by mouth every 6 (six) hours as needed for nausea.  Dispense: 60 tablet; Refill: 1  4. Type 2 diabetes mellitus without complication, unspecified whether long term insulin use ([place]) Orders Placed This Encounter  Procedures   CBC   RPR   HIV Antibody (routine testing w rflx)     Preterm labor symptoms and general obstetric precautions including but not limited to vaginal bleeding, contractions, leaking of fluid and fetal movement were reviewed in detail with the patient. Please refer to After Visit Summary for other counseling recommendations.   Return in about 2 weeks (around 05/25/2022).  Future Appointments  Date Time Provider [place]  05/11/2022 12:30 PM [facility] NURSE [facility]  05/11/2022 12:45 PM [facility] US6 [facility]  05/18/2022  3:00 PM [facility] NURSE [facility]  05/18/2022  3:15 PM [facility] NST [facility]  05/25/2022  2:30 PM [facility] NURSE [facility]  05/25/2022  2:45 PM [facility] US7 [facility]  05/31/2022  3:30 PM [facility] NURSE [facility]  05/31/2022  3:45 PM [facility] US7 [facility]  06/08/2022  2:30 PM [facility] NURSE [facility]  06/08/2022  2:45 PM [facility] US7 [facility]    Emeterio Reeve, MD

## 2022-05-12 LAB — CBC
Hematocrit: 36.1 % (ref 34.0–46.6)
Hemoglobin: 11.6 g/dL (ref 11.1–15.9)
MCH: 24.8 pg — ABNORMAL LOW (ref 26.6–33.0)
MCHC: 32.1 g/dL (ref 31.5–35.7)
MCV: 77 fL — ABNORMAL LOW (ref 79–97)
Platelets: 339 10*3/uL (ref 150–450)
RBC: 4.68 x10E6/uL (ref 3.77–5.28)
RDW: 13.8 % (ref 11.7–15.4)
WBC: 10.1 10*3/uL (ref 3.4–10.8)

## 2022-05-12 LAB — GLUCOSE TOLERANCE, 2 HOURS W/ 1HR
Glucose, 1 hour: 237 mg/dL — ABNORMAL HIGH (ref 70–179)
Glucose, 2 hour: 215 mg/dL — ABNORMAL HIGH (ref 70–152)
Glucose, Fasting: 121 mg/dL — ABNORMAL HIGH (ref 70–91)

## 2022-05-12 LAB — HIV ANTIBODY (ROUTINE TESTING W REFLEX): HIV Screen 4th Generation wRfx: NONREACTIVE

## 2022-05-12 LAB — RPR: RPR Ser Ql: NONREACTIVE

## 2022-05-17 ENCOUNTER — Inpatient Hospital Stay (HOSPITAL_COMMUNITY)
Admission: AD | Admit: 2022-05-17 | Discharge: 2022-05-17 | Disposition: A | Discharge status: Home / Self Care | Payer: Medicaid Other | Attending: Obstetrics and Gynecology | Admitting: Obstetrics and Gynecology

## 2022-05-17 ENCOUNTER — Encounter (HOSPITAL_COMMUNITY): Payer: Self-pay | Admitting: Obstetrics and Gynecology

## 2022-05-17 DIAGNOSIS — O219 Vomiting of pregnancy, unspecified: Secondary | ICD-10-CM

## 2022-05-17 DIAGNOSIS — M549 Dorsalgia, unspecified: Secondary | ICD-10-CM | POA: Diagnosis not present

## 2022-05-17 DIAGNOSIS — O24113 Pre-existing diabetes mellitus, type 2, in pregnancy, third trimester: Secondary | ICD-10-CM | POA: Insufficient documentation

## 2022-05-17 DIAGNOSIS — O24313 Unspecified pre-existing diabetes mellitus in pregnancy, third trimester: Secondary | ICD-10-CM

## 2022-05-17 DIAGNOSIS — O26893 Other specified pregnancy related conditions, third trimester: Secondary | ICD-10-CM | POA: Diagnosis not present

## 2022-05-17 DIAGNOSIS — Z3A32 32 weeks gestation of pregnancy: Secondary | ICD-10-CM | POA: Diagnosis not present

## 2022-05-17 DIAGNOSIS — Z419 Encounter for procedure for purposes other than remedying health state, unspecified: Secondary | ICD-10-CM | POA: Diagnosis not present

## 2022-05-17 DIAGNOSIS — O24319 Unspecified pre-existing diabetes mellitus in pregnancy, unspecified trimester: Secondary | ICD-10-CM

## 2022-05-17 DIAGNOSIS — O99891 Other specified diseases and conditions complicating pregnancy: Secondary | ICD-10-CM | POA: Diagnosis not present

## 2022-05-17 LAB — URINALYSIS, ROUTINE W REFLEX MICROSCOPIC
Bilirubin Urine: NEGATIVE
Glucose, UA: NEGATIVE mg/dL
Ketones, ur: 20 mg/dL — AB
Leukocytes,Ua: NEGATIVE
Nitrite: NEGATIVE
Protein, ur: 30 mg/dL — AB
Specific Gravity, Urine: 1.017 (ref 1.005–1.030)
pH: 6 (ref 5.0–8.0)

## 2022-05-17 LAB — COMPREHENSIVE METABOLIC PANEL
ALT: 15 U/L (ref 0–44)
AST: 24 U/L (ref 15–41)
Albumin: 3 g/dL — ABNORMAL LOW (ref 3.5–5.0)
Alkaline Phosphatase: 89 U/L (ref 38–126)
Anion gap: 11 (ref 5–15)
BUN: 5 mg/dL — ABNORMAL LOW (ref 6–20)
CO2: 18 mmol/L — ABNORMAL LOW (ref 22–32)
Calcium: 8.9 mg/dL (ref 8.9–10.3)
Chloride: 104 mmol/L (ref 98–111)
Creatinine, Ser: 0.44 mg/dL (ref 0.44–1.00)
GFR, Estimated: >60 mL/min (ref >60)
Glucose, Bld: 124 mg/dL — ABNORMAL HIGH (ref 70–99)
Potassium: 3.5 mmol/L (ref 3.5–5.1)
Sodium: 133 mmol/L — ABNORMAL LOW (ref 135–145)
Total Bilirubin: 0.9 mg/dL (ref 0.3–1.2)
Total Protein: 6.8 g/dL (ref 6.5–8.1)

## 2022-05-17 LAB — GLUCOSE, CAPILLARY: Glucose-Capillary: 123 mg/dL — ABNORMAL HIGH (ref 70–99)

## 2022-05-17 LAB — CBC
HCT: 36.3 % (ref 36.0–46.0)
Hemoglobin: 11.9 g/dL — ABNORMAL LOW (ref 12.0–15.0)
MCH: 25.3 pg — ABNORMAL LOW (ref 26.0–34.0)
MCHC: 32.8 g/dL (ref 30.0–36.0)
MCV: 77.1 fL — ABNORMAL LOW (ref 80.0–100.0)
Platelets: 334 10*3/uL (ref 150–400)
RBC: 4.71 MIL/uL (ref 3.87–5.11)
RDW: 13.9 % (ref 11.5–15.5)
WBC: 10.4 10*3/uL (ref 4.0–10.5)
nRBC: 0 % (ref 0.0–0.2)

## 2022-05-17 MED ORDER — ONDANSETRON HCL 4 MG/2ML IJ SOLN
4.0000 mg | Freq: Once | INTRAMUSCULAR | Status: AC
  Administered 2022-05-17: 4 mg via INTRAVENOUS
  Filled 2022-05-17: qty 2

## 2022-05-17 MED ORDER — CYCLOBENZAPRINE HCL 5 MG PO TABS
10.0000 mg | ORAL_TABLET | Freq: Once | ORAL | Status: AC
  Administered 2022-05-17: 10 mg via ORAL
  Filled 2022-05-17: qty 2

## 2022-05-17 MED ORDER — GLYBURIDE 2.5 MG PO TABS: 1.2500 mg | ORAL_TABLET | Freq: Every day | ORAL | 2 refills | Status: DC

## 2022-05-17 MED ORDER — SCOPOLAMINE 1 MG/3DAYS TD PT72
1.0000 | MEDICATED_PATCH | TRANSDERMAL | Status: DC
  Administered 2022-05-17: 1.5 mg via TRANSDERMAL
  Filled 2022-05-17: qty 1

## 2022-05-17 MED ORDER — GLUCOCOM MONITOR W/DEVICE KIT: 1.0000 | PACK | Freq: Once | 0 refills | Status: AC

## 2022-05-17 MED ORDER — METOCLOPRAMIDE HCL 5 MG/ML IJ SOLN
10.0000 mg | Freq: Once | INTRAMUSCULAR | Status: AC
  Administered 2022-05-17: 10 mg via INTRAVENOUS
  Filled 2022-05-17: qty 2

## 2022-05-17 MED ORDER — CYCLOBENZAPRINE HCL 10 MG PO TABS: 10.0000 mg | ORAL_TABLET | Freq: Two times a day (BID) | ORAL | 0 refills | Status: DC | PRN

## 2022-05-17 MED ORDER — LACTATED RINGERS IV BOLUS
1000.0000 mL | Freq: Once | INTRAVENOUS | Status: AC
  Administered 2022-05-17: 1000 mL via INTRAVENOUS

## 2022-05-17 NOTE — MAU Provider Note (Signed)
History     CSN: [id number]  Arrival date and time: 05/17/22 4174   Event Date/Time   First Provider Initiated Contact with Patient 05/17/22 [phone]      Chief Complaint  Patient presents with   Contractions   Back Pain   Nausea   Emesis   HPI  Roberta Lutz is a 27 y.o. G2P1001 at [redacted]w[redacted]d who presents for evaluation of nausea and vomiting with back pain. Patient reports she started vomiting every 1-2 hours since 2200. She has not taken any of her prescribed nausea medication. She also reports lower back pain that comes and goes. She is concerned she is having contractions. Patient rates the pain as a 7/10 and has not tried anything for the pain. She denies any vaginal bleeding, discharge, and leaking of fluid. Denies any constipation, diarrhea or any urinary complaints. Reports normal fetal movement.  Patient states she has not been taking her CBGs at home because she cannot find her meter. She states she is not taking her glyburide because she ran out and has not gotten a refill.   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   Rosacea     Past Surgical History:  Procedure Laterality Date   DRUG INDUCED ENDOSCOPY     Gastritis    Family History  Problem Relation Age of Onset   Diabetes Mother    Arthritis Mother    Birth defects Brother    ADD / ADHD Brother    Asthma Brother    Diabetes Maternal Grandmother    Arthritis Maternal Grandmother    Cancer Maternal Grandfather     Social History   Tobacco Use   Smoking status: Never     Passive exposure: Never   Smokeless tobacco: Never  Vaping Use   Vaping Use: Never used  Substance Use Topics   Alcohol use: Not Currently    Alcohol/week: 2.0 standard drinks of alcohol    Types: 2 Glasses of wine per week    Comment: 3-4 times per week prior to confirmed pregnancy   Drug use: No    Allergies: No Known Allergies  No medications prior to admission.    Review of Systems  Constitutional: Negative.  Negative for fatigue and fever.  HENT: Negative.    Respiratory: Negative.  Negative for shortness of breath.   Cardiovascular: Negative.  Negative for chest pain.  Gastrointestinal:  Positive for nausea and vomiting. Negative for abdominal pain, constipation and diarrhea.  Genitourinary: Negative.  Negative for dysuria, vaginal bleeding and vaginal discharge.  Musculoskeletal:  Positive for back pain.  Neurological: Negative.  Negative for dizziness and headaches.   Physical Exam   Blood pressure 114/66, pulse (!) 105, temperature 98.5 F (36.9 C), temperature source Oral,  resp. rate 19, height 5\' 3"  (1.6 m), last menstrual period 09/24/2021, SpO2 100 %.  Patient Vitals for the past 24 hrs:  BP Temp Temp src Pulse Resp SpO2 Height  05/17/22 1145 114/66 -- -- (!) 105 -- 100 % --  05/17/22 0918 119/76 98.5 F (36.9 C) Oral (!) 121 19 100 % 5\' 3"  (1.6 m)    Physical Exam Vitals and nursing note reviewed.  Constitutional:      General: She is not in acute distress.    Appearance: She is well-developed.  HENT:     Head: Normocephalic.  Eyes:     Pupils: Pupils are equal, round, and reactive to light.  Cardiovascular:     Rate and Rhythm: Normal rate and regular rhythm.     Heart sounds: Normal heart sounds.  Pulmonary:     Effort: Pulmonary effort is normal. No respiratory distress.     Breath sounds: Normal breath sounds.  Abdominal:     General: Bowel sounds are normal. There is no distension.     Palpations: Abdomen is soft.     Tenderness: There is no  abdominal tenderness.  Skin:    General: Skin is warm and dry.  Neurological:     Mental Status: She is alert and oriented to person, place, and time.  Psychiatric:        Mood and Affect: Mood normal.        Behavior: Behavior normal.        Thought Content: Thought content normal.        Judgment: Judgment normal.     Fetal Tracing:  Baseline: 150 Variability: moderate Accels: 15x15 Decels: none  Toco: none  Dilation: Closed Exam by:: Len Blalock, CNM   MAU Course  Procedures  Results for orders placed or performed during the hospital encounter of 05/17/22 (from the past 24 hour(s))  Urinalysis, Routine w reflex microscopic -Urine, Clean Catch     Status: Abnormal   Collection Time: 05/17/22  9:22 AM  Result Value Ref Range   Color, Urine YELLOW YELLOW   APPearance HAZY (A) CLEAR   Specific Gravity, Urine 1.017 1.005 - 1.030   pH 6.0 5.0 - 8.0   Glucose, UA NEGATIVE NEGATIVE mg/dL   Hgb urine dipstick SMALL (A) NEGATIVE   Bilirubin Urine NEGATIVE NEGATIVE   Ketones, ur 20 (A) NEGATIVE mg/dL   Protein, ur 30 (A) NEGATIVE mg/dL   Nitrite NEGATIVE NEGATIVE   Leukocytes,Ua NEGATIVE NEGATIVE   RBC / HPF 0-5 0 - 5 RBC/hpf   WBC, UA 0-5 0 - 5 WBC/hpf   Bacteria, UA RARE (A) NONE SEEN   Squamous Epithelial / HPF 11-20 0 - 5 /HPF   Mucus PRESENT   Glucose, capillary     Status: Abnormal   Collection Time: 05/17/22  9:28 AM  Result Value Ref Range   Glucose-Capillary 123 (H) 70 - 99 mg/dL  CBC     Status: Abnormal   Collection Time: 05/17/22  9:34 AM  Result Value Ref Range   WBC 10.4 4.0 - 10.5 K/uL   RBC 4.71 3.87 - 5.11 MIL/uL   Hemoglobin 11.9 (L) 12.0 - 15.0 g/dL   HCT 36.3 36.0 - 46.0 %   MCV 77.1 (L) 80.0 - 100.0 fL   MCH 25.3 (L) 26.0 - 34.0 pg   MCHC 32.8 30.0 - 36.0 g/dL   RDW 13.9 11.5 - 15.5 %   Platelets 334 150 - 400 K/uL   nRBC 0.0 0.0 -  0.2 %  Comprehensive metabolic panel     Status: Abnormal   Collection Time: 05/17/22  9:34 AM  Result  Value Ref Range   Sodium 133 (L) 135 - 145 mmol/L   Potassium 3.5 3.5 - 5.1 mmol/L   Chloride 104 98 - 111 mmol/L   CO2 18 (L) 22 - 32 mmol/L   Glucose, Bld 124 (H) 70 - 99 mg/dL   BUN 5 (L) 6 - 20 mg/dL   Creatinine, Ser 0.44 0.44 - 1.00 mg/dL   Calcium 8.9 8.9 - 10.3 mg/dL   Total Protein 6.8 6.5 - 8.1 g/dL   Albumin 3.0 (L) 3.5 - 5.0 g/dL   AST 24 15 - 41 U/L   ALT 15 0 - 44 U/L   Alkaline Phosphatase 89 38 - 126 U/L   Total Bilirubin 0.9 0.3 - 1.2 mg/dL   GFR, Estimated >60 >60 mL/min   Anion gap 11 5 - 15    MDM Labs ordered and reviewed.   LR bolus UA CBC, CMP, CBG  Reglan IV Zofran IV Scop patch  Flexeril PO  Patient reports no nausea and improvement in back pain.   Lengthy discussion about importance of testing blood sugars, bringing log book and keeping appointments. Discussed risks of uncontrolled DM in pregnancy including delayed fetal lung maturity, IUFD and shoulder dystocia possibly resulting brachial plexus palsy, brain damage, intrapartum death and extensive obstetric lacerations. Patient verbalizes understanding.  Assessment and Plan   1. Nausea/vomiting in pregnancy   2. Preexisting diabetes complicating pregnancy, antepartum   3. [redacted] weeks gestation of pregnancy   4. Back pain affecting pregnancy in third trimester     -Discharge home in stable condition -Rx for glyburide and meter sent to pharmacy -Third trimester precautions discussed -Patient advised to follow-up with OB as scheduled for prenatal care tomorrow -Patient may return to MAU as needed or if her condition were to change or worsen  [name], CNM 05/17/2022, 9:24 AM

## 2022-05-17 NOTE — MAU Note (Addendum)
...  Roberta Lutz is a 27 y.o. at [redacted]w[redacted]d here in MAU reporting: N/V, occasional abdominal tightness, and lower back pain since 2200 last night. She reports she has had emesis episodes every 1-2 hours. Has not taken anything for the pain. Denies VB or LOF. Denies vaginal discharge, vaginal itching, and vaginal odors. Denies urinary symptoms. +FM.   Last PO Meds: Diclegis when she started vomiting last night.   Rx at home: Diclegis, Reglan, and protonix. Has not started taking Reglan or Protonix. Ran out of Glyburide. Has not checked her blood sugar in a while as she does not know where her machine is.  Onset of complaint: 2200 last night Pain score:  6/10 abdomen - mid to lower tightness 7/10 lower back pain - across entire back  FHT: 175 initial external Lab orders placed from triage: UA

## 2022-05-18 ENCOUNTER — Ambulatory Visit: Payer: Medicaid Other | Attending: Obstetrics and Gynecology

## 2022-05-18 ENCOUNTER — Ambulatory Visit: Payer: Medicaid Other | Admitting: *Deleted

## 2022-05-18 VITALS — BP 102/62 | HR 84

## 2022-05-18 DIAGNOSIS — E119 Type 2 diabetes mellitus without complications: Secondary | ICD-10-CM | POA: Diagnosis not present

## 2022-05-18 DIAGNOSIS — Z3A00 Weeks of gestation of pregnancy not specified: Secondary | ICD-10-CM | POA: Insufficient documentation

## 2022-05-18 DIAGNOSIS — Z3A32 32 weeks gestation of pregnancy: Secondary | ICD-10-CM | POA: Diagnosis not present

## 2022-05-18 DIAGNOSIS — O24113 Pre-existing diabetes mellitus, type 2, in pregnancy, third trimester: Secondary | ICD-10-CM | POA: Diagnosis not present

## 2022-05-18 DIAGNOSIS — O24319 Unspecified pre-existing diabetes mellitus in pregnancy, unspecified trimester: Secondary | ICD-10-CM | POA: Diagnosis not present

## 2022-05-18 DIAGNOSIS — O0993 Supervision of high risk pregnancy, unspecified, third trimester: Secondary | ICD-10-CM

## 2022-05-18 DIAGNOSIS — Z7984 Long term (current) use of oral hypoglycemic drugs: Secondary | ICD-10-CM

## 2022-05-18 NOTE — Procedures (Cosign Needed)
Roberta Lutz 01/31/1996 [redacted]w[redacted]d  Fetus A Non-Stress Test Interpretation for 05/18/22  Indication:  Pre Gestational, GDM  Fetal Heart Rate A Mode: External Baseline Rate (A): 135 bpm Variability: Moderate Accelerations: 15 x 15 Decelerations: None Multiple birth?: No  Uterine Activity Mode: Toco, Palpation Contraction Frequency (min): no UC Resting Tone Palpated: Relaxed Resting Time: Adequate  Interpretation (Fetal Testing) Nonstress Test Interpretation: Reactive Overall Impression: Reassuring for gestational age Comments: tracing reviewed by Dr Carlis Abbott

## 2022-05-18 NOTE — Progress Notes (Signed)
Fetus A Non-Stress Test Interpretation for 05/18/2022   Indication: T2DM   Fetal Heart Rate A Mode: External Baseline Rate (A): 135 Variability: Moderate Accelerations: 15x15 Decelerations: none Multiple birth?: no   Uterine Activity  Mode: toco Contraction Frequency (min): none   Interpretation (Fetal Testing) Nonstress Test Interpretation: reactive Overall Impression: Reassuring for Gestational age Comments: Tracing reviewed by Dr   Evern Core, RNC

## 2022-05-21 ENCOUNTER — Other Ambulatory Visit: Payer: Self-pay

## 2022-05-21 DIAGNOSIS — E119 Type 2 diabetes mellitus without complications: Secondary | ICD-10-CM

## 2022-05-21 MED ORDER — ACCU-CHEK SOFTCLIX LANCETS MISC: 100.0000 | Freq: Four times a day (QID) | 12 refills | Status: AC

## 2022-05-21 MED ORDER — ACCU-CHEK GUIDE VI STRP: ORAL_STRIP | 12 refills | Status: AC

## 2022-05-25 ENCOUNTER — Ambulatory Visit: Payer: Medicaid Other

## 2022-05-25 ENCOUNTER — Other Ambulatory Visit: Payer: Self-pay

## 2022-05-25 ENCOUNTER — Ambulatory Visit: Payer: Medicaid Other | Attending: Obstetrics

## 2022-05-25 VITALS — BP 114/89 | HR 109

## 2022-05-25 DIAGNOSIS — O24113 Pre-existing diabetes mellitus, type 2, in pregnancy, third trimester: Secondary | ICD-10-CM | POA: Insufficient documentation

## 2022-05-25 DIAGNOSIS — Z3A33 33 weeks gestation of pregnancy: Secondary | ICD-10-CM | POA: Diagnosis not present

## 2022-05-25 DIAGNOSIS — E669 Obesity, unspecified: Secondary | ICD-10-CM

## 2022-05-25 DIAGNOSIS — O0993 Supervision of high risk pregnancy, unspecified, third trimester: Secondary | ICD-10-CM | POA: Diagnosis not present

## 2022-05-25 DIAGNOSIS — O99213 Obesity complicating pregnancy, third trimester: Secondary | ICD-10-CM

## 2022-05-25 DIAGNOSIS — O99210 Obesity complicating pregnancy, unspecified trimester: Secondary | ICD-10-CM

## 2022-05-25 DIAGNOSIS — D563 Thalassemia minor: Secondary | ICD-10-CM | POA: Diagnosis not present

## 2022-05-25 DIAGNOSIS — O24319 Unspecified pre-existing diabetes mellitus in pregnancy, unspecified trimester: Secondary | ICD-10-CM

## 2022-05-25 DIAGNOSIS — O24415 Gestational diabetes mellitus in pregnancy, controlled by oral hypoglycemic drugs: Secondary | ICD-10-CM

## 2022-05-28 ENCOUNTER — Ambulatory Visit: Payer: Medicaid Other | Admitting: Licensed Clinical Social Worker

## 2022-05-28 DIAGNOSIS — Z91199 Patient's noncompliance with other medical treatment and regimen due to unspecified reason: Secondary | ICD-10-CM

## 2022-05-28 NOTE — BH Specialist Note (Signed)
Called pt regarding scheduled mychart visit. Mychart link sent via phone and email.   Pt no show

## 2022-05-29 ENCOUNTER — Ambulatory Visit (INDEPENDENT_AMBULATORY_CARE_PROVIDER_SITE_OTHER): Payer: Medicaid Other | Admitting: Obstetrics & Gynecology

## 2022-05-29 ENCOUNTER — Institutional Professional Consult (permissible substitution): Payer: Self-pay | Admitting: Licensed Clinical Social Worker

## 2022-05-29 VITALS — BP 111/79 | HR 106 | Wt 226.0 lb

## 2022-05-29 DIAGNOSIS — O0993 Supervision of high risk pregnancy, unspecified, third trimester: Secondary | ICD-10-CM

## 2022-05-29 DIAGNOSIS — Z23 Encounter for immunization: Secondary | ICD-10-CM

## 2022-05-29 DIAGNOSIS — O99213 Obesity complicating pregnancy, third trimester: Secondary | ICD-10-CM

## 2022-05-29 DIAGNOSIS — O24319 Unspecified pre-existing diabetes mellitus in pregnancy, unspecified trimester: Secondary | ICD-10-CM

## 2022-05-29 DIAGNOSIS — O24313 Unspecified pre-existing diabetes mellitus in pregnancy, third trimester: Secondary | ICD-10-CM

## 2022-05-29 DIAGNOSIS — Z3A33 33 weeks gestation of pregnancy: Secondary | ICD-10-CM

## 2022-05-29 MED ORDER — GLYBURIDE 2.5 MG PO TABS: 2.5000 mg | ORAL_TABLET | Freq: Every day | ORAL | 2 refills | Status: AC

## 2022-05-29 NOTE — Progress Notes (Unsigned)
   PRENATAL VISIT NOTE  Subjective:  Roberta Lutz is a 27 y.o. G2P1001 at [redacted]w[redacted]d being seen today for ongoing prenatal care.  She is currently monitored for the following issues for this high-risk pregnancy and has Alpha thalassemia silent carrier; Cutaneous pseudolymphoma; Diabetes mellitus ([place]); Reactive lymphoid hyperplasia; Supervision of high-risk pregnancy; Asthma affecting pregnancy, antepartum; Preexisting diabetes complicating pregnancy, antepartum; Maternal morbid obesity, antepartum ([place]); and Acute bronchiolitis due to respiratory syncytial virus (RSV) on their problem list.  Patient reports  symptomatic BG drops .  Contractions: Irregular. Vag. Bleeding: None.  Movement: Present. Denies leaking of fluid.   The following portions of the patient's history were reviewed and updated as appropriate: allergies, current medications, past family history, past medical history, past social history, past surgical history and problem list.   Objective:   Vitals:   05/29/22 0829  BP: 111/79  Pulse: (!) 106  Weight: 102.5 kg    Fetal Status: Fetal Heart Rate (bpm): 153   Movement: Present     General:  Alert, oriented and cooperative. Patient is in no acute distress.  Skin: Skin is warm and dry. No rash noted.   Cardiovascular: Normal heart rate noted  Respiratory: Normal respiratory effort, no problems with respiration noted  Abdomen: Soft, gravid, appropriate for gestational age.  Pain/Pressure: Present     Pelvic: Cervical exam deferred        Extremities: Normal range of motion.  Edema: None  Mental Status: Normal mood and affect. Normal behavior. Normal judgment and thought content.   Assessment and Plan:  Pregnancy: G2P1001 at [redacted]w[redacted]d 1. Supervision of high risk pregnancy in third trimester  - Tdap vaccine greater than or equal to 7yo IM - Flu Vaccine QUAD 36+ mos IM (Fluarix, Quad PF)  2. Maternal morbid obesity, antepartum ([place])   3. Preexisting diabetes complicating  pregnancy, antepartum Hypoglycemic episodes during the day and FBS 100-108, change dosing rather than starting insulin - glyBURIDE (DIABETA) 2.5 MG tablet; Take 1 tablet (2.5 mg total) by mouth daily with supper.  Some hypoglyc: 30 tablet; Refill: 2  4. [redacted] weeks gestation of pregnancy  - Tdap vaccine greater than or equal to 7yo IM - Flu Vaccine QUAD 36+ mos IM (Fluarix, Quad PF)  Preterm labor symptoms and general obstetric precautions including but not limited to vaginal bleeding, contractions, leaking of fluid and fetal movement were reviewed in detail with the patient. Please refer to After Visit Summary for other counseling recommendations.   Return in about 2 weeks (around 06/12/2022).  Future Appointments  Date Time Provider [place]  05/31/2022  3:30 PM [facility] NURSE [facility]  05/31/2022  3:45 PM [facility] US7 [facility]  06/08/2022  2:30 PM [facility] NURSE [facility]  06/08/2022  2:45 PM [facility] US7 [facility]  06/15/2022  3:15 PM [facility] NURSE [facility]  06/15/2022  3:30 PM [facility] US3 W[facility]-MFCUS [place]    Emeterio Reeve, MD

## 2022-05-29 NOTE — Progress Notes (Addendum)
HROB, wants to discuss [place] results and T2DM.  TDAp and FLU vaccines given in LD, tolerated well.

## 2022-05-31 ENCOUNTER — Ambulatory Visit: Payer: Medicaid Other | Admitting: *Deleted

## 2022-05-31 ENCOUNTER — Ambulatory Visit: Payer: Medicaid Other | Attending: Obstetrics

## 2022-05-31 VITALS — BP 127/80 | HR 106

## 2022-05-31 DIAGNOSIS — O99213 Obesity complicating pregnancy, third trimester: Secondary | ICD-10-CM | POA: Diagnosis not present

## 2022-05-31 DIAGNOSIS — O0993 Supervision of high risk pregnancy, unspecified, third trimester: Secondary | ICD-10-CM | POA: Diagnosis not present

## 2022-05-31 DIAGNOSIS — O0900 Supervision of pregnancy with history of infertility, unspecified trimester: Secondary | ICD-10-CM | POA: Diagnosis not present

## 2022-05-31 DIAGNOSIS — O24319 Unspecified pre-existing diabetes mellitus in pregnancy, unspecified trimester: Secondary | ICD-10-CM | POA: Diagnosis not present

## 2022-05-31 DIAGNOSIS — O24113 Pre-existing diabetes mellitus, type 2, in pregnancy, third trimester: Secondary | ICD-10-CM | POA: Diagnosis not present

## 2022-05-31 DIAGNOSIS — O09293 Supervision of pregnancy with other poor reproductive or obstetric history, third trimester: Secondary | ICD-10-CM | POA: Diagnosis not present

## 2022-05-31 DIAGNOSIS — Z3A34 34 weeks gestation of pregnancy: Secondary | ICD-10-CM | POA: Diagnosis not present

## 2022-05-31 DIAGNOSIS — E669 Obesity, unspecified: Secondary | ICD-10-CM

## 2022-05-31 DIAGNOSIS — D563 Thalassemia minor: Secondary | ICD-10-CM

## 2022-05-31 DIAGNOSIS — O24415 Gestational diabetes mellitus in pregnancy, controlled by oral hypoglycemic drugs: Secondary | ICD-10-CM

## 2022-05-31 DIAGNOSIS — O99210 Obesity complicating pregnancy, unspecified trimester: Secondary | ICD-10-CM | POA: Insufficient documentation

## 2022-05-31 DIAGNOSIS — J210 Acute bronchiolitis due to respiratory syncytial virus: Secondary | ICD-10-CM | POA: Diagnosis not present

## 2022-05-31 DIAGNOSIS — O9952 Diseases of the respiratory system complicating childbirth: Secondary | ICD-10-CM | POA: Diagnosis not present

## 2022-05-31 DIAGNOSIS — O24111 Pre-existing diabetes mellitus, type 2, in pregnancy, first trimester: Secondary | ICD-10-CM | POA: Diagnosis not present

## 2022-06-01 ENCOUNTER — Other Ambulatory Visit: Payer: Self-pay | Admitting: *Deleted

## 2022-06-01 DIAGNOSIS — O99213 Obesity complicating pregnancy, third trimester: Secondary | ICD-10-CM

## 2022-06-01 DIAGNOSIS — D560 Alpha thalassemia: Secondary | ICD-10-CM

## 2022-06-01 DIAGNOSIS — O24913 Unspecified diabetes mellitus in pregnancy, third trimester: Secondary | ICD-10-CM

## 2022-06-01 DIAGNOSIS — Z8759 Personal history of other complications of pregnancy, childbirth and the puerperium: Secondary | ICD-10-CM

## 2022-06-04 ENCOUNTER — Ambulatory Visit (INDEPENDENT_AMBULATORY_CARE_PROVIDER_SITE_OTHER): Payer: Medicaid Other | Admitting: Primary Care

## 2022-06-08 ENCOUNTER — Ambulatory Visit: Payer: Medicaid Other | Attending: Obstetrics

## 2022-06-08 ENCOUNTER — Ambulatory Visit: Payer: Medicaid Other | Admitting: *Deleted

## 2022-06-08 VITALS — BP 110/66 | HR 93

## 2022-06-08 DIAGNOSIS — O09293 Supervision of pregnancy with other poor reproductive or obstetric history, third trimester: Secondary | ICD-10-CM

## 2022-06-08 DIAGNOSIS — O24415 Gestational diabetes mellitus in pregnancy, controlled by oral hypoglycemic drugs: Secondary | ICD-10-CM

## 2022-06-08 DIAGNOSIS — E669 Obesity, unspecified: Secondary | ICD-10-CM

## 2022-06-08 DIAGNOSIS — D563 Thalassemia minor: Secondary | ICD-10-CM

## 2022-06-08 DIAGNOSIS — Z3A35 35 weeks gestation of pregnancy: Secondary | ICD-10-CM

## 2022-06-08 DIAGNOSIS — O99213 Obesity complicating pregnancy, third trimester: Secondary | ICD-10-CM

## 2022-06-08 DIAGNOSIS — O0993 Supervision of high risk pregnancy, unspecified, third trimester: Secondary | ICD-10-CM | POA: Diagnosis not present

## 2022-06-08 DIAGNOSIS — O99210 Obesity complicating pregnancy, unspecified trimester: Secondary | ICD-10-CM | POA: Insufficient documentation

## 2022-06-08 DIAGNOSIS — O24113 Pre-existing diabetes mellitus, type 2, in pregnancy, third trimester: Secondary | ICD-10-CM | POA: Insufficient documentation

## 2022-06-08 DIAGNOSIS — O24319 Unspecified pre-existing diabetes mellitus in pregnancy, unspecified trimester: Secondary | ICD-10-CM | POA: Insufficient documentation

## 2022-06-14 ENCOUNTER — Ambulatory Visit (INDEPENDENT_AMBULATORY_CARE_PROVIDER_SITE_OTHER): Payer: Medicaid Other | Admitting: Obstetrics & Gynecology

## 2022-06-14 ENCOUNTER — Encounter: Payer: Self-pay | Admitting: Obstetrics & Gynecology

## 2022-06-14 ENCOUNTER — Other Ambulatory Visit (HOSPITAL_COMMUNITY)
Admission: RE | Admit: 2022-06-14 | Discharge: 2022-06-14 | Disposition: A | Discharge status: Home / Self Care | Payer: Medicaid Other | Source: Ambulatory Visit | Attending: Obstetrics & Gynecology | Admitting: Obstetrics & Gynecology

## 2022-06-14 VITALS — BP 119/81 | HR 102 | Wt 226.0 lb

## 2022-06-14 DIAGNOSIS — O0993 Supervision of high risk pregnancy, unspecified, third trimester: Secondary | ICD-10-CM

## 2022-06-14 DIAGNOSIS — O99210 Obesity complicating pregnancy, unspecified trimester: Secondary | ICD-10-CM

## 2022-06-14 DIAGNOSIS — O99213 Obesity complicating pregnancy, third trimester: Secondary | ICD-10-CM

## 2022-06-14 DIAGNOSIS — Z3A36 36 weeks gestation of pregnancy: Secondary | ICD-10-CM

## 2022-06-14 NOTE — Progress Notes (Signed)
ROB/GBS.  

## 2022-06-14 NOTE — Progress Notes (Signed)
   PRENATAL VISIT NOTE  Subjective:  Roberta Lutz is a 27 y.o. G2P1001 at 62w1dbeing seen today for ongoing prenatal care.  She is currently monitored for the following issues for this high-risk pregnancy and has Alpha thalassemia silent carrier; Cutaneous pseudolymphoma; Diabetes mellitus ([place]; Reactive lymphoid hyperplasia; Supervision of high-risk pregnancy; Asthma affecting pregnancy, antepartum; Preexisting diabetes complicating pregnancy, antepartum; Maternal morbid obesity, antepartum ([place]; and Acute bronchiolitis due to respiratory syncytial virus (RSV) on their problem list.  Patient reports no complaints.  Contractions: Irregular. Vag. Bleeding: None.  Movement: Present. Denies leaking of fluid.   The following portions of the patient's history were reviewed and updated as appropriate: allergies, current medications, past family history, past medical history, past social history, past surgical history and problem list.   Objective:   Vitals:   06/14/22 0908  BP: 119/81  Pulse: (!) 102  Weight: 102.5 kg    Fetal Status: Fetal Heart Rate (bpm): 154   Movement: Present  Presentation: Vertex  General:  Alert, oriented and cooperative. Patient is in no acute distress.  Skin: Skin is warm and dry. No rash noted.   Cardiovascular: Normal heart rate noted  Respiratory: Normal respiratory effort, no problems with respiration noted  Abdomen: Soft, gravid, appropriate for gestational age.  Pain/Pressure: Present     Pelvic: Cervical exam performed in the presence of a chaperone Dilation: 1 Effacement (%): 20    Extremities: Normal range of motion.  Edema: None  Mental Status: Normal mood and affect. Normal behavior. Normal judgment and thought content.   Assessment and Plan:  Pregnancy: G2P1001 at 345w1d. Supervision of high risk pregnancy in third trimester  - Culture, beta strep (group b only) - Cervicovaginal ancillary only( [place]. Maternal morbid obesity,  antepartum ([place]  Preterm labor symptoms and general obstetric precautions including but not limited to vaginal bleeding, contractions, leaking of fluid and fetal movement were reviewed in detail with the patient. Please refer to After Visit Summary for other counseling recommendations.   Return in about 1 week (around 06/21/2022).  Future Appointments  Date Time Provider [place]3/04/2022  3:15 PM [facility]/04/2022  3:30 PM [facility]/09/2022  8:15 AM [facility] NURSE [facility]/09/2022  8:30 AM [facility] US2 [facility]/02/2023  1:30 PM ArWoodroe ModeMD CWM[place]  06/29/2022  2:45 PM [facility] NURSE [facility]3/15/2024  3:00 PM [facility] US1 [facility]3/20/2024  7:15 AM MC-LD SCHED ROOM MC-INDC None    JaEmeterio ReeveMD

## 2022-06-15 ENCOUNTER — Ambulatory Visit: Payer: Medicaid Other | Attending: Obstetrics and Gynecology

## 2022-06-15 ENCOUNTER — Ambulatory Visit: Payer: Medicaid Other | Admitting: *Deleted

## 2022-06-15 VITALS — BP 125/76 | HR 89

## 2022-06-15 DIAGNOSIS — E669 Obesity, unspecified: Secondary | ICD-10-CM | POA: Diagnosis not present

## 2022-06-15 DIAGNOSIS — O24319 Unspecified pre-existing diabetes mellitus in pregnancy, unspecified trimester: Secondary | ICD-10-CM

## 2022-06-15 DIAGNOSIS — O09293 Supervision of pregnancy with other poor reproductive or obstetric history, third trimester: Secondary | ICD-10-CM | POA: Diagnosis not present

## 2022-06-15 DIAGNOSIS — Z419 Encounter for procedure for purposes other than remedying health state, unspecified: Secondary | ICD-10-CM | POA: Diagnosis not present

## 2022-06-15 DIAGNOSIS — O99213 Obesity complicating pregnancy, third trimester: Secondary | ICD-10-CM | POA: Insufficient documentation

## 2022-06-15 DIAGNOSIS — Z3A36 36 weeks gestation of pregnancy: Secondary | ICD-10-CM

## 2022-06-15 DIAGNOSIS — D563 Thalassemia minor: Secondary | ICD-10-CM

## 2022-06-15 DIAGNOSIS — O99210 Obesity complicating pregnancy, unspecified trimester: Secondary | ICD-10-CM | POA: Diagnosis not present

## 2022-06-15 DIAGNOSIS — O24415 Gestational diabetes mellitus in pregnancy, controlled by oral hypoglycemic drugs: Secondary | ICD-10-CM | POA: Diagnosis not present

## 2022-06-15 DIAGNOSIS — O0993 Supervision of high risk pregnancy, unspecified, third trimester: Secondary | ICD-10-CM | POA: Insufficient documentation

## 2022-06-15 LAB — CERVICOVAGINAL ANCILLARY ONLY
Chlamydia: NEGATIVE
Comment: NEGATIVE
Comment: NORMAL
Neisseria Gonorrhea: NEGATIVE

## 2022-06-19 ENCOUNTER — Ambulatory Visit: Payer: Medicaid Other

## 2022-06-19 ENCOUNTER — Telehealth: Payer: Self-pay

## 2022-06-19 LAB — CULTURE, BETA STREP (GROUP B ONLY): Strep Gp B Culture: NEGATIVE

## 2022-06-19 NOTE — Telephone Encounter (Signed)
Message received from [place] requesting provider signature and NPI for discontinuation of oxygen.   Roberta Lutz did not leave a call back number, but did leave a fax number [phone]  Called patient to discuss. No answer. Unable to leave vm. VM box is full

## 2022-06-20 ENCOUNTER — Ambulatory Visit: Payer: Medicaid Other | Admitting: *Deleted

## 2022-06-20 ENCOUNTER — Ambulatory Visit: Payer: Medicaid Other | Attending: Obstetrics and Gynecology

## 2022-06-20 ENCOUNTER — Other Ambulatory Visit: Payer: Self-pay

## 2022-06-20 VITALS — BP 120/84 | HR 93

## 2022-06-20 DIAGNOSIS — O09293 Supervision of pregnancy with other poor reproductive or obstetric history, third trimester: Secondary | ICD-10-CM | POA: Diagnosis not present

## 2022-06-20 DIAGNOSIS — O99213 Obesity complicating pregnancy, third trimester: Secondary | ICD-10-CM | POA: Insufficient documentation

## 2022-06-20 DIAGNOSIS — O0993 Supervision of high risk pregnancy, unspecified, third trimester: Secondary | ICD-10-CM | POA: Diagnosis not present

## 2022-06-20 DIAGNOSIS — E669 Obesity, unspecified: Secondary | ICD-10-CM

## 2022-06-20 DIAGNOSIS — O24319 Unspecified pre-existing diabetes mellitus in pregnancy, unspecified trimester: Secondary | ICD-10-CM | POA: Insufficient documentation

## 2022-06-20 DIAGNOSIS — O24415 Gestational diabetes mellitus in pregnancy, controlled by oral hypoglycemic drugs: Secondary | ICD-10-CM | POA: Diagnosis not present

## 2022-06-20 DIAGNOSIS — M62838 Other muscle spasm: Secondary | ICD-10-CM

## 2022-06-20 DIAGNOSIS — O99210 Obesity complicating pregnancy, unspecified trimester: Secondary | ICD-10-CM | POA: Diagnosis not present

## 2022-06-20 DIAGNOSIS — D563 Thalassemia minor: Secondary | ICD-10-CM

## 2022-06-20 DIAGNOSIS — Z3A37 37 weeks gestation of pregnancy: Secondary | ICD-10-CM

## 2022-06-20 MED ORDER — CYCLOBENZAPRINE HCL 10 MG PO TABS: 10.0000 mg | ORAL_TABLET | Freq: Two times a day (BID) | ORAL | 0 refills | Status: DC | PRN

## 2022-06-22 ENCOUNTER — Telehealth: Payer: Self-pay

## 2022-06-22 NOTE — Telephone Encounter (Signed)
Called patient to follow up with message received from the after hours nurse stating that patient reports having infrequent contractions and that she was leaking fluids.  Followed up with patient regarding sx. Patient states that she believes it was a false alarm. She states that she was just getting out of the shower when it happened and believes that it was from where she did not dry very well. She said it was just a small trickle down her thigh.  Patient states that she has not had any additional leaking since that one occurrence. States that her contractions are also not consistent may every 20 minutes or every couple hours. States that they are very irregular.   Patient states that she is feeling baby move well.  Patient advised to monitor at this time. If she has another episode of leaking and/or contractions become closer and more intense, and experience any decrease in movement to be evaluated.  Patient verbalized understanding.

## 2022-06-25 ENCOUNTER — Ambulatory Visit (INDEPENDENT_AMBULATORY_CARE_PROVIDER_SITE_OTHER): Payer: Medicaid Other | Admitting: Obstetrics & Gynecology

## 2022-06-25 ENCOUNTER — Encounter: Payer: Self-pay | Admitting: Obstetrics & Gynecology

## 2022-06-25 VITALS — BP 120/86 | HR 95 | Wt 229.1 lb

## 2022-06-25 DIAGNOSIS — O24313 Unspecified pre-existing diabetes mellitus in pregnancy, third trimester: Secondary | ICD-10-CM

## 2022-06-25 DIAGNOSIS — O24319 Unspecified pre-existing diabetes mellitus in pregnancy, unspecified trimester: Secondary | ICD-10-CM

## 2022-06-25 DIAGNOSIS — O99213 Obesity complicating pregnancy, third trimester: Secondary | ICD-10-CM

## 2022-06-25 DIAGNOSIS — O0993 Supervision of high risk pregnancy, unspecified, third trimester: Secondary | ICD-10-CM

## 2022-06-25 DIAGNOSIS — Z3A37 37 weeks gestation of pregnancy: Secondary | ICD-10-CM

## 2022-06-25 NOTE — Progress Notes (Signed)
   PRENATAL VISIT NOTE  Subjective:  Roberta Lutz is a 27 y.o. G2P1001 at [redacted]w[redacted]d being seen today for ongoing prenatal care.  She is currently monitored for the following issues for this high-risk pregnancy and has Alpha thalassemia silent carrier; Cutaneous pseudolymphoma; Diabetes mellitus ([place]); Reactive lymphoid hyperplasia; Supervision of high-risk pregnancy; Asthma affecting pregnancy, antepartum; Preexisting diabetes complicating pregnancy, antepartum; Maternal morbid obesity, antepartum ([place]); and Acute bronchiolitis due to respiratory syncytial virus (RSV) on their problem list.  Patient reports occasional contractions.  Contractions: Irregular. Vag. Bleeding: None.  Movement: Present. Denies leaking of fluid.   The following portions of the patient's history were reviewed and updated as appropriate: allergies, current medications, past family history, past medical history, past social history, past surgical history and problem list.   Objective:   Vitals:   06/25/22 1343  BP: 120/86  Pulse: 95  Weight: 103.9 kg    Fetal Status:     Movement: Present  Presentation: Vertex  General:  Alert, oriented and cooperative. Patient is in no acute distress.  Skin: Skin is warm and dry. No rash noted.   Cardiovascular: Normal heart rate noted  Respiratory: Normal respiratory effort, no problems with respiration noted  Abdomen: Soft, gravid, appropriate for gestational age.  Pain/Pressure: Present     Pelvic: Cervical exam performed in the presence of a chaperone Dilation: 1 Effacement (%): 20 Station: Ballotable  Extremities: Normal range of motion.  Edema: Trace  Mental Status: Normal mood and affect. Normal behavior. Normal judgment and thought content.   Assessment and Plan:  Pregnancy: G2P1001 at [redacted]w[redacted]d 1. Supervision of high risk pregnancy in third trimester Weekly BPP  2. Preexisting diabetes complicating pregnancy, antepartum Good control on Glyburide   3. Maternal morbid  obesity, antepartum ([place])   Term labor symptoms and general obstetric precautions including but not limited to vaginal bleeding, contractions, leaking of fluid and fetal movement were reviewed in detail with the patient. Please refer to After Visit Summary for other counseling recommendations.  IOL in 11 days Return if symptoms worsen or fail to improve, for postpartum.  Future Appointments  Date Time Provider [place]  06/29/2022  2:45 PM [facility] NURSE [facility]  06/29/2022  3:00 PM [facility] US1 W[facility]US [facility]  07/04/2022  7:15 AM MC-LD SCHED ROOM MC-INDC None    Emeterio Reeve, MD

## 2022-06-25 NOTE — Progress Notes (Signed)
Pt presents for ROB requests cx check today. CBG readings available per pt

## 2022-06-26 ENCOUNTER — Inpatient Hospital Stay (HOSPITAL_COMMUNITY)
Admission: AD | Admit: 2022-06-26 | Discharge: 2022-06-26 | Disposition: A | Discharge status: Home / Self Care | Payer: Medicaid Other | Attending: Family Medicine | Admitting: Family Medicine

## 2022-06-26 ENCOUNTER — Other Ambulatory Visit: Payer: Self-pay

## 2022-06-26 ENCOUNTER — Encounter (HOSPITAL_COMMUNITY): Payer: Self-pay | Admitting: Family Medicine

## 2022-06-26 DIAGNOSIS — O99283 Endocrine, nutritional and metabolic diseases complicating pregnancy, third trimester: Secondary | ICD-10-CM | POA: Insufficient documentation

## 2022-06-26 DIAGNOSIS — Z3A37 37 weeks gestation of pregnancy: Secondary | ICD-10-CM | POA: Insufficient documentation

## 2022-06-26 DIAGNOSIS — Z7984 Long term (current) use of oral hypoglycemic drugs: Secondary | ICD-10-CM | POA: Diagnosis not present

## 2022-06-26 DIAGNOSIS — O99213 Obesity complicating pregnancy, third trimester: Secondary | ICD-10-CM | POA: Diagnosis not present

## 2022-06-26 DIAGNOSIS — I10 Essential (primary) hypertension: Secondary | ICD-10-CM

## 2022-06-26 DIAGNOSIS — O24113 Pre-existing diabetes mellitus, type 2, in pregnancy, third trimester: Secondary | ICD-10-CM | POA: Diagnosis not present

## 2022-06-26 DIAGNOSIS — Z8249 Family history of ischemic heart disease and other diseases of the circulatory system: Secondary | ICD-10-CM | POA: Diagnosis not present

## 2022-06-26 DIAGNOSIS — E282 Polycystic ovarian syndrome: Secondary | ICD-10-CM | POA: Diagnosis not present

## 2022-06-26 DIAGNOSIS — E119 Type 2 diabetes mellitus without complications: Secondary | ICD-10-CM | POA: Diagnosis not present

## 2022-06-26 DIAGNOSIS — Z7951 Long term (current) use of inhaled steroids: Secondary | ICD-10-CM | POA: Insufficient documentation

## 2022-06-26 DIAGNOSIS — Z3A38 38 weeks gestation of pregnancy: Secondary | ICD-10-CM | POA: Diagnosis not present

## 2022-06-26 DIAGNOSIS — Z833 Family history of diabetes mellitus: Secondary | ICD-10-CM | POA: Diagnosis not present

## 2022-06-26 DIAGNOSIS — O0993 Supervision of high risk pregnancy, unspecified, third trimester: Secondary | ICD-10-CM

## 2022-06-26 DIAGNOSIS — O113 Pre-existing hypertension with pre-eclampsia, third trimester: Secondary | ICD-10-CM | POA: Diagnosis not present

## 2022-06-26 DIAGNOSIS — O471 False labor at or after 37 completed weeks of gestation: Secondary | ICD-10-CM | POA: Diagnosis not present

## 2022-06-26 DIAGNOSIS — Z79899 Other long term (current) drug therapy: Secondary | ICD-10-CM | POA: Insufficient documentation

## 2022-06-26 DIAGNOSIS — O10913 Unspecified pre-existing hypertension complicating pregnancy, third trimester: Secondary | ICD-10-CM | POA: Diagnosis not present

## 2022-06-26 DIAGNOSIS — O24319 Unspecified pre-existing diabetes mellitus in pregnancy, unspecified trimester: Secondary | ICD-10-CM

## 2022-06-26 HISTORY — DX: Gestational (pregnancy-induced) hypertension without significant proteinuria, unspecified trimester: O13.9

## 2022-06-26 LAB — COMPREHENSIVE METABOLIC PANEL
ALT: 13 U/L (ref 0–44)
AST: 17 U/L (ref 15–41)
Albumin: 2.8 g/dL — ABNORMAL LOW (ref 3.5–5.0)
Alkaline Phosphatase: 138 U/L — ABNORMAL HIGH (ref 38–126)
Anion gap: 12 (ref 5–15)
BUN: 6 mg/dL (ref 6–20)
CO2: 18 mmol/L — ABNORMAL LOW (ref 22–32)
Calcium: 9.1 mg/dL (ref 8.9–10.3)
Chloride: 105 mmol/L (ref 98–111)
Creatinine, Ser: 0.72 mg/dL (ref 0.44–1.00)
GFR, Estimated: >60 mL/min (ref >60)
Glucose, Bld: 76 mg/dL (ref 70–99)
Potassium: 3.9 mmol/L (ref 3.5–5.1)
Sodium: 135 mmol/L (ref 135–145)
Total Bilirubin: 0.4 mg/dL (ref 0.3–1.2)
Total Protein: 6.7 g/dL (ref 6.5–8.1)

## 2022-06-26 LAB — CBC
HCT: 38.5 % (ref 36.0–46.0)
Hemoglobin: 12.8 g/dL (ref 12.0–15.0)
MCH: 25.4 pg — ABNORMAL LOW (ref 26.0–34.0)
MCHC: 33.2 g/dL (ref 30.0–36.0)
MCV: 76.4 fL — ABNORMAL LOW (ref 80.0–100.0)
Platelets: 347 10*3/uL (ref 150–400)
RBC: 5.04 MIL/uL (ref 3.87–5.11)
RDW: 14.5 % (ref 11.5–15.5)
WBC: 9.9 10*3/uL (ref 4.0–10.5)
nRBC: 0 % (ref 0.0–0.2)

## 2022-06-26 LAB — PROTEIN / CREATININE RATIO, URINE
Creatinine, Urine: 159 mg/dL
Protein Creatinine Ratio: 0.26 mg/mg{Cre} — ABNORMAL HIGH (ref 0.00–0.15)
Total Protein, Urine: 42 mg/dL

## 2022-06-26 NOTE — MAU Provider Note (Signed)
History     CSN: [id number]  Arrival date and time: 06/26/22 [id number]   Event Date/Time   First Provider Initiated Contact with Patient 06/26/22 1129      Chief Complaint  Patient presents with   Contractions   Hypertension   HPI Patient is 27 y.o. G2P1001 62w6dhere with complaints of contractions but found to have elevated blood pressures.  She reports CTX started being more intense and consistent at 0730 this morning. She reports a HA yesterday that went away with rest and flexeril. She denies RUQ pain, blurry vision, edema.   +FM, denies LOF, VB, contractions, vaginal discharge.   Pregnancy is complicated by  OB History     Gravida  2   Para  1   Term  1   Preterm      AB      Living  1      SAB      IAB      Ectopic      Multiple  0   Live Births  1           Past Medical History:  Diagnosis Date   Anxiety    Asthma    Diabetes mellitus without complication ([place]    Type II   Gestational hypertension 04/06/2020   Headache    PCOS (polycystic ovarian syndrome)    Pilonidal cyst    Pilonidal cyst 07/03/2011   Pregnancy induced hypertension    Reactive lymphoid hyperplasia 04/08/2015   Rosacea     Past Surgical History:  Procedure Laterality Date   DRUG INDUCED ENDOSCOPY     Gastritis    Family History  Problem Relation Age of Onset   Diabetes Mother    Arthritis Mother    Hypertension Father    Birth defects Brother    ADD / ADHD Brother    Asthma Brother    Diabetes Maternal Grandmother    Arthritis Maternal Grandmother    Cancer Maternal Grandfather    Heart disease Neg Hx    Stroke Neg Hx     Social History   Tobacco Use   Smoking status: Never    Passive exposure: Never   Smokeless tobacco: Never  Vaping Use   Vaping Use: Never used  Substance Use Topics   Alcohol use: Not Currently    Alcohol/week: 2.0 standard drinks of alcohol    Types: 2 Glasses of wine per week    Comment: 3-4 times per week prior to  confirmed pregnancy   Drug use: No    Allergies: No Known Allergies  Medications Prior to Admission  Medication Sig Dispense Refill Last Dose   cyclobenzaprine (FLEXERIL) 10 MG tablet Take 1 tablet (10 mg total) by mouth 2 (two) times daily as needed for muscle spasms. 20 tablet 0 06/25/2022   glyBURIDE (DIABETA) 2.5 MG tablet Take 1 tablet (2.5 mg total) by mouth daily with supper. 30 tablet 2 06/25/2022   Prenatal Vit-Fe Fumarate-FA (PRENATAL MULTIVITAMIN) TABS tablet Take 1 tablet by mouth daily at 12 noon.   06/25/2022   Accu-Chek Softclix Lancets lancets 100 each by Other route 4 (four) times daily. 100 each 12    acetaminophen (TYLENOL) 500 MG tablet Take 500 mg by mouth every 6 (six) hours as needed. (Patient not taking: Reported on 06/25/2022)      doxylamine, Sleep, (UNISOM) 25 MG tablet Take 25 mg by mouth at bedtime as needed. (Patient not taking: Reported on 06/25/2022)  Doxylamine-Pyridoxine (DICLEGIS PO) Take by mouth. (Patient not taking: Reported on 05/25/2022)      fluticasone-salmeterol (ADVAIR DISKUS) 100-50 MCG/ACT AEPB Inhale 1 puff into the lungs 2 (two) times daily. (Patient not taking: Reported on 05/31/2022) 60 each 1    glucose blood (ACCU-CHEK GUIDE) test strip Use as instructed 100 each 12    metoCLOPramide (REGLAN) 5 MG tablet Take 1 tablet (5 mg total) by mouth every 6 (six) hours as needed for nausea. 60 tablet 1 06/24/2022   pantoprazole (PROTONIX) 20 MG tablet Take 1 tablet (20 mg total) by mouth daily. (Patient not taking: Reported on 05/25/2022) 30 tablet 0     Review of Systems  Constitutional:  Negative for chills and fever.  HENT:  Negative for congestion and sore throat.   Eyes:  Negative for pain and visual disturbance.  Respiratory:  Negative for cough, chest tightness and shortness of breath.   Cardiovascular:  Negative for chest pain.  Gastrointestinal:  Negative for abdominal pain, diarrhea, nausea and vomiting.  Endocrine: Negative for cold  intolerance and heat intolerance.  Genitourinary:  Negative for dysuria and flank pain.  Musculoskeletal:  Negative for back pain.  Skin:  Negative for rash.  Allergic/Immunologic: Negative for food allergies.  Neurological:  Negative for dizziness and light-headedness.  Psychiatric/Behavioral:  Negative for agitation.    Physical Exam   Blood pressure 138/84, pulse 89, temperature 98.1 F (36.7 C), temperature source Oral, resp. rate 20, height '5\' 3"'$  (1.6 m), weight 102.6 kg, last menstrual period 09/24/2021, SpO2 100 %.  Patient Vitals for the past 24 hrs:  BP Temp Temp src Pulse Resp SpO2 Height Weight  06/26/22 1400 138/84 98.1 F (36.7 C) Oral 89 20 100 % -- --  06/26/22 1345 118/88 -- -- 90 -- -- -- --  06/26/22 1330 110/68 -- -- 77 -- -- -- --  06/26/22 1315 113/79 -- -- 86 -- -- -- --  06/26/22 1300 122/84 -- -- 96 -- -- -- --  06/26/22 1245 (!) 134/94 -- -- 91 -- 99 % -- --  06/26/22 1230 123/74 -- -- 92 -- 99 % -- --  06/26/22 1215 (!) 131/101 -- -- (!) 113 -- 100 % -- --  06/26/22 1200 (!) 137/101 -- -- 100 -- 100 % -- --  06/26/22 1146 (!) 141/100 -- -- 94 -- 99 % -- --  06/26/22 1130 (!) 136/102 -- -- 93 -- 100 % -- --  06/26/22 1100 125/89 -- -- 96 20 99 % -- --  06/26/22 1046 (!) 143/92 -- -- 90 20 100 % -- --  06/26/22 1030 (!) 136/96 -- -- 100 -- 100 % -- --  06/26/22 1016 (!) 138/95 -- -- 100 -- 99 % -- --  06/26/22 0959 (!) 129/97 98.1 F (36.7 C) Oral 98 20 99 % -- --  06/26/22 0948 -- -- -- -- -- -- '5\' 3"'$  (1.6 m) 102.6 kg    Physical Exam Vitals and nursing note reviewed.  Constitutional:      General: She is not in acute distress.    Appearance: She is well-developed.     Comments: Pregnant female  HENT:     Head: Normocephalic and atraumatic.  Eyes:     General: No scleral icterus.    Conjunctiva/sclera: Conjunctivae normal.  Cardiovascular:     Rate and Rhythm: Normal rate.  Pulmonary:     Effort: Pulmonary effort is normal.  Chest:      Chest wall: No tenderness.  Abdominal:     Palpations: Abdomen is soft.     Tenderness: There is no abdominal tenderness. There is no guarding or rebound.     Comments: Gravid  Genitourinary:    Vagina: Normal.  Musculoskeletal:        General: Normal range of motion.     Cervical back: Normal range of motion and neck supple.  Skin:    General: Skin is warm and dry.     Findings: No rash.  Neurological:     Mental Status: She is alert and oriented to person, place, and time.   Dilation: 2 Effacement (%): Thick Station: -3 Presentation: Vertex Exam by:: Dr. Ernestina Patches  I reviewed the patient's fetal monitoring. -- monitored for nearly 4 hours Baseline HR: 145 Variability:  moderate Accels:present Decels: none Toco: initially every 3-5 minutes but spaced 6-7 and decreased intensity.   A/P: Reactive NST  Reassured regarding fetal status.   MAU Course  Procedures  MDM: high  This patient presents to the ED for concern of   Chief Complaint  Patient presents with   Contractions   Hypertension     These complains involves an extensive number of treatment options, and is a complaint that carries with it a high risk of complications and morbidity.  The differential diagnosis for  1. Elevated Blood pressure INCLUDES preeclampsia, gestational hypertension, transient BP elevated  2. Contractions INCLUDES latent labor vs active labor  Co morbidities that complicate the patient evaluation:  Additional history obtained from partner  Interpreter services used: no  External records from outside source obtained and reviewed including CareEverywhere and Prenatal care records  Lab Tests: UA, CMP, CBC, and Urine Protein Creatinine Ratio  I ordered, and personally interpreted labs.  The pertinent results include:  CMP, CBC, UPC  Imaging Studies ordered: none  Cardiac Testing/Monitoring: none  Medicines ordered and prescription drug management:  Medications: none    I have  reviewed the patients home medicines and have made adjustments as needed  Test Considered: none   MAU Course: Initial cervical exam was 1.5 cm  2:27 PM reviewed BP decrease without intervention starting at 1300. Repeat Cervical exam with minimal change from prior. Discussed discharge home with close outpatient follow up and patient is in agreement with plan. Desires to be out of work and wrote a note to be out for remainder of pregnancy.  After the interventions noted above, I reevaluated the patient and found that they have :improved  Dispostion: discharged   Assessment and Plan   1. Elevated blood pressure reading in office with diagnosis of hypertension   2. Supervision of high risk pregnancy in third trimester   3. Preexisting diabetes complicating pregnancy, antepartum   4. Maternal morbid obesity, antepartum ([place])   5. Prolonged latent phase of labor   6. [redacted] weeks gestation of pregnancy     Reviewed warning sx and sx of preeclampsia Set up appt tomorrow at [facility] for BP check and has MFM [place] on Friday Reviewed when to return from a contraction standpoint and given strict labor precautions Patient voiced understanding and desires to go home Letter for work given today  Future Appointments  Date Time Provider [place]  06/27/2022 10:00 AM [place] None  06/29/2022  2:45 PM [facility] NURSE [facility]  06/29/2022  3:00 PM [facility] US1 [facility]  07/04/2022  7:15 AM MC-LD SCHED ROOM MC-INDC None     Allergies as of 06/26/2022   No Known Allergies  Medication List     TAKE these medications    Accu-Chek Guide test strip Generic drug: glucose blood Use as instructed   Accu-Chek Softclix Lancets lancets 100 each by Other route 4 (four) times daily.   acetaminophen 500 MG tablet Commonly known as: TYLENOL Take 500 mg by mouth every 6 (six) hours as needed.   Advair Diskus 100-50 MCG/ACT Aepb Generic drug: fluticasone-salmeterol Inhale  1 puff into the lungs 2 (two) times daily.   cyclobenzaprine 10 MG tablet Commonly known as: FLEXERIL Take 1 tablet (10 mg total) by mouth 2 (two) times daily as needed for muscle spasms.   DICLEGIS PO Take by mouth.   doxylamine (Sleep) 25 MG tablet Commonly known as: UNISOM Take 25 mg by mouth at bedtime as needed.   glyBURIDE 2.5 MG tablet Commonly known as: DIABETA Take 1 tablet (2.5 mg total) by mouth daily with supper.   metoCLOPramide 5 MG tablet Commonly known as: Reglan Take 1 tablet (5 mg total) by mouth every 6 (six) hours as needed for nausea.   pantoprazole 20 MG tablet Commonly known as: PROTONIX Take 1 tablet (20 mg total) by mouth daily.   prenatal multivitamin Tabs tablet Take 1 tablet by mouth daily at 12 noon.         Juanita Craver [facility] 06/26/2022, 2:27 PM

## 2022-06-26 NOTE — Discharge Instructions (Signed)
Come to the MAU (maternity admission unit) for 1) Strong contractions every 2-3 minutes for at least 1 hour that do not go away when you drink water or take a warm shower. These contractions will be so strong all you can do is breath through them 2) Vaginal bleeding- anything more than spotting 3) Loss of fluid like you broke your water 4) Decreased movement of your baby  

## 2022-06-26 NOTE — MAU Note (Signed)
Roberta Lutz is a 27 y.o. at 52w6dhere in MAU reporting: she's having ctxs that are 5 minutes apart and have been consistent since 0730 this morning.  Denies VB or LOF.  Reports +FM LMP: NA Onset of complaint: today Pain score: 5 Vitals:   06/26/22 0959  BP: (!) 129/97  Pulse: 98  Resp: 20  Temp: 98.1 F (36.7 C)  SpO2: 99%     FHT:141 bpm Lab orders placed from triage:   UA

## 2022-06-27 ENCOUNTER — Encounter (HOSPITAL_COMMUNITY): Payer: Self-pay

## 2022-06-27 ENCOUNTER — Inpatient Hospital Stay (EMERGENCY_DEPARTMENT_HOSPITAL)
Admission: AD | Admit: 2022-06-27 | Discharge: 2022-06-27 | Disposition: A | Discharge status: Home / Self Care | Payer: Medicaid Other | Source: Home / Self Care | Attending: Obstetrics and Gynecology | Admitting: Obstetrics and Gynecology

## 2022-06-27 ENCOUNTER — Ambulatory Visit: Payer: Medicaid Other

## 2022-06-27 ENCOUNTER — Other Ambulatory Visit: Payer: Self-pay | Admitting: Advanced Practice Midwife

## 2022-06-27 ENCOUNTER — Telehealth (HOSPITAL_COMMUNITY): Payer: Self-pay | Admitting: *Deleted

## 2022-06-27 ENCOUNTER — Encounter (HOSPITAL_COMMUNITY): Payer: Self-pay | Admitting: Obstetrics and Gynecology

## 2022-06-27 DIAGNOSIS — O471 False labor at or after 37 completed weeks of gestation: Secondary | ICD-10-CM

## 2022-06-27 DIAGNOSIS — O24113 Pre-existing diabetes mellitus, type 2, in pregnancy, third trimester: Secondary | ICD-10-CM

## 2022-06-27 DIAGNOSIS — Z3A38 38 weeks gestation of pregnancy: Secondary | ICD-10-CM

## 2022-06-27 DIAGNOSIS — O24319 Unspecified pre-existing diabetes mellitus in pregnancy, unspecified trimester: Secondary | ICD-10-CM

## 2022-06-27 MED ORDER — ZOLPIDEM TARTRATE 5 MG PO TABS: 10.0000 mg | ORAL_TABLET | Freq: Once | ORAL | Status: DC

## 2022-06-27 MED ORDER — ZOLPIDEM TARTRATE 5 MG PO TABS
5.0000 mg | ORAL_TABLET | Freq: Once | ORAL | Status: DC
  Filled 2022-06-27: qty 1

## 2022-06-27 NOTE — Telephone Encounter (Signed)
Preadmission screen  

## 2022-06-27 NOTE — MAU Provider Note (Signed)
[name] is a G2P1001 at 67w0dseen in MAU for labor. RN labor check, not seen by provider. SVE by RN Dilation: 1.5 Effacement (%): 50 Cervical Position: Posterior Station: -3 Presentation: Vertex Exam by:: JBlima Singer RN   NST - FHR: 145 bpm / moderate variability / accels present / decels absent / TOCO: irregular every 3-6 mins   Plan:  D/C home with labor precautions Keep scheduled appt with MFM on 06/29/2022  RLaury Deep CNM  06/27/2022 4:28 AM

## 2022-06-27 NOTE — MAU Note (Signed)
Pt refused Ambien. RN asked if pt wanted RN to ask for other pain medication. Pt denied any other pain medication.

## 2022-06-27 NOTE — MAU Note (Signed)
.  Roberta Lutz is a 27 y.o. at [redacted]w[redacted]d here in MAU reporting ctxs since 2330. Pt was here yesterday for labor ck and sent home. Ctxs stopped but started again last night.  Was 2cm yesterday. Denies VB or LOF and reports good FM.   Onset of complaint: 2330 Pain score: 8 Vitals:   06/27/22 0221 06/27/22 0223  BP:  (!) 133/95  Pulse: 94   Resp: 20   Temp: 98.1 F (36.7 C)   SpO2: 99%      FHT:154 Lab orders placed from triage:  labor eval

## 2022-06-28 ENCOUNTER — Inpatient Hospital Stay (HOSPITAL_COMMUNITY)
Admission: AD | Admit: 2022-06-28 | Discharge: 2022-06-30 | DRG: 807 | Disposition: A | Discharge status: Home / Self Care | Payer: Medicaid Other | Attending: Obstetrics and Gynecology | Admitting: Obstetrics and Gynecology

## 2022-06-28 ENCOUNTER — Telehealth (HOSPITAL_COMMUNITY): Payer: Self-pay | Admitting: *Deleted

## 2022-06-28 ENCOUNTER — Other Ambulatory Visit: Payer: Self-pay

## 2022-06-28 ENCOUNTER — Encounter (HOSPITAL_COMMUNITY): Payer: Self-pay | Admitting: Anesthesiology

## 2022-06-28 ENCOUNTER — Telehealth: Payer: Self-pay | Admitting: *Deleted

## 2022-06-28 ENCOUNTER — Encounter (HOSPITAL_COMMUNITY): Payer: Self-pay | Admitting: Obstetrics and Gynecology

## 2022-06-28 ENCOUNTER — Encounter (HOSPITAL_COMMUNITY): Payer: Self-pay | Admitting: *Deleted

## 2022-06-28 DIAGNOSIS — O9952 Diseases of the respiratory system complicating childbirth: Secondary | ICD-10-CM | POA: Diagnosis not present

## 2022-06-28 DIAGNOSIS — O99519 Diseases of the respiratory system complicating pregnancy, unspecified trimester: Secondary | ICD-10-CM | POA: Diagnosis present

## 2022-06-28 DIAGNOSIS — O24113 Pre-existing diabetes mellitus, type 2, in pregnancy, third trimester: Secondary | ICD-10-CM

## 2022-06-28 DIAGNOSIS — O26893 Other specified pregnancy related conditions, third trimester: Secondary | ICD-10-CM | POA: Diagnosis not present

## 2022-06-28 DIAGNOSIS — Z3A38 38 weeks gestation of pregnancy: Secondary | ICD-10-CM

## 2022-06-28 DIAGNOSIS — O471 False labor at or after 37 completed weeks of gestation: Secondary | ICD-10-CM | POA: Diagnosis not present

## 2022-06-28 DIAGNOSIS — O0990 Supervision of high risk pregnancy, unspecified, unspecified trimester: Secondary | ICD-10-CM

## 2022-06-28 DIAGNOSIS — J45909 Unspecified asthma, uncomplicated: Secondary | ICD-10-CM | POA: Diagnosis not present

## 2022-06-28 DIAGNOSIS — L988 Other specified disorders of the skin and subcutaneous tissue: Secondary | ICD-10-CM | POA: Diagnosis present

## 2022-06-28 DIAGNOSIS — E119 Type 2 diabetes mellitus without complications: Secondary | ICD-10-CM | POA: Diagnosis not present

## 2022-06-28 DIAGNOSIS — O134 Gestational [pregnancy-induced] hypertension without significant proteinuria, complicating childbirth: Secondary | ICD-10-CM | POA: Diagnosis present

## 2022-06-28 DIAGNOSIS — O99214 Obesity complicating childbirth: Secondary | ICD-10-CM | POA: Diagnosis present

## 2022-06-28 DIAGNOSIS — Z7984 Long term (current) use of oral hypoglycemic drugs: Secondary | ICD-10-CM | POA: Diagnosis not present

## 2022-06-28 DIAGNOSIS — O0993 Supervision of high risk pregnancy, unspecified, third trimester: Principal | ICD-10-CM

## 2022-06-28 DIAGNOSIS — O2412 Pre-existing diabetes mellitus, type 2, in childbirth: Principal | ICD-10-CM | POA: Diagnosis present

## 2022-06-28 DIAGNOSIS — Z8632 Personal history of gestational diabetes: Secondary | ICD-10-CM

## 2022-06-28 DIAGNOSIS — Z148 Genetic carrier of other disease: Secondary | ICD-10-CM

## 2022-06-28 DIAGNOSIS — D563 Thalassemia minor: Secondary | ICD-10-CM | POA: Diagnosis present

## 2022-06-28 DIAGNOSIS — Z3490 Encounter for supervision of normal pregnancy, unspecified, unspecified trimester: Secondary | ICD-10-CM | POA: Diagnosis present

## 2022-06-28 DIAGNOSIS — O24319 Unspecified pre-existing diabetes mellitus in pregnancy, unspecified trimester: Secondary | ICD-10-CM

## 2022-06-28 LAB — CBC
HCT: 42.2 % (ref 36.0–46.0)
Hemoglobin: 13.8 g/dL (ref 12.0–15.0)
MCH: 25.5 pg — ABNORMAL LOW (ref 26.0–34.0)
MCHC: 32.7 g/dL (ref 30.0–36.0)
MCV: 77.9 fL — ABNORMAL LOW (ref 80.0–100.0)
Platelets: 365 10*3/uL (ref 150–400)
RBC: 5.42 MIL/uL — ABNORMAL HIGH (ref 3.87–5.11)
RDW: 14.6 % (ref 11.5–15.5)
WBC: 13 10*3/uL — ABNORMAL HIGH (ref 4.0–10.5)
nRBC: 0 % (ref 0.0–0.2)

## 2022-06-28 LAB — TYPE AND SCREEN
ABO/RH(D): O POS
Antibody Screen: NEGATIVE

## 2022-06-28 LAB — GLUCOSE, CAPILLARY: Glucose-Capillary: 90 mg/dL (ref 70–99)

## 2022-06-28 MED ORDER — ONDANSETRON HCL 4 MG PO TABS: 4.0000 mg | ORAL_TABLET | ORAL | Status: DC | PRN

## 2022-06-28 MED ORDER — SIMETHICONE 80 MG PO CHEW: 80.0000 mg | CHEWABLE_TABLET | ORAL | Status: DC | PRN

## 2022-06-28 MED ORDER — COCONUT OIL OIL: 1.0000 | TOPICAL_OIL | Status: DC | PRN

## 2022-06-28 MED ORDER — ACETAMINOPHEN 325 MG PO TABS
650.0000 mg | ORAL_TABLET | ORAL | Status: DC | PRN
  Administered 2022-06-28 – 2022-06-30 (×6): 650 mg via ORAL
  Filled 2022-06-28 (×6): qty 2

## 2022-06-28 MED ORDER — ONDANSETRON HCL 4 MG/2ML IJ SOLN: 4.0000 mg | INTRAMUSCULAR | Status: DC | PRN

## 2022-06-28 MED ORDER — DIPHENHYDRAMINE HCL 50 MG/ML IJ SOLN: 12.5000 mg | INTRAMUSCULAR | Status: DC | PRN

## 2022-06-28 MED ORDER — IBUPROFEN 600 MG PO TABS
600.0000 mg | ORAL_TABLET | Freq: Four times a day (QID) | ORAL | Status: DC
  Administered 2022-06-28 – 2022-06-30 (×8): 600 mg via ORAL
  Filled 2022-06-28 (×8): qty 1

## 2022-06-28 MED ORDER — DIBUCAINE (PERIANAL) 1 % EX OINT: 1.0000 | TOPICAL_OINTMENT | CUTANEOUS | Status: DC | PRN

## 2022-06-28 MED ORDER — PHENYLEPHRINE 80 MCG/ML (10ML) SYRINGE FOR IV PUSH (FOR BLOOD PRESSURE SUPPORT): 80.0000 ug | PREFILLED_SYRINGE | INTRAVENOUS | Status: DC | PRN

## 2022-06-28 MED ORDER — OXYTOCIN-SODIUM CHLORIDE 30-0.9 UT/500ML-% IV SOLN
INTRAVENOUS | Status: AC
  Filled 2022-06-28: qty 500

## 2022-06-28 MED ORDER — LACTATED RINGERS IV SOLN: 500.0000 mL | Freq: Once | INTRAVENOUS | Status: DC

## 2022-06-28 MED ORDER — EPHEDRINE 5 MG/ML INJ: 10.0000 mg | INTRAVENOUS | Status: DC | PRN

## 2022-06-28 MED ORDER — LACTATED RINGERS IV SOLN: INTRAVENOUS | Status: DC

## 2022-06-28 MED ORDER — GLYBURIDE 2.5 MG PO TABS
2.5000 mg | ORAL_TABLET | ORAL | Status: DC
  Administered 2022-06-28 – 2022-06-29 (×2): 2.5 mg via ORAL
  Filled 2022-06-28 (×3): qty 1

## 2022-06-28 MED ORDER — LACTATED RINGERS IV SOLN
500.0000 mL | INTRAVENOUS | Status: DC | PRN
  Administered 2022-06-28: 1000 mL via INTRAVENOUS

## 2022-06-28 MED ORDER — WITCH HAZEL-GLYCERIN EX PADS: 1.0000 | MEDICATED_PAD | CUTANEOUS | Status: DC | PRN

## 2022-06-28 MED ORDER — GLYBURIDE 2.5 MG PO TABS: 2.5000 mg | ORAL_TABLET | Freq: Every day | ORAL | Status: DC

## 2022-06-28 MED ORDER — ONDANSETRON HCL 4 MG/2ML IJ SOLN
4.0000 mg | Freq: Four times a day (QID) | INTRAMUSCULAR | Status: DC | PRN
  Administered 2022-06-28: 4 mg via INTRAVENOUS
  Filled 2022-06-28: qty 2

## 2022-06-28 MED ORDER — DIPHENHYDRAMINE HCL 25 MG PO CAPS: 25.0000 mg | ORAL_CAPSULE | Freq: Four times a day (QID) | ORAL | Status: DC | PRN

## 2022-06-28 MED ORDER — FENTANYL-BUPIVACAINE-NACL 0.5-0.125-0.9 MG/250ML-% EP SOLN
12.0000 mL/h | EPIDURAL | Status: DC | PRN
  Filled 2022-06-28: qty 250

## 2022-06-28 MED ORDER — BENZOCAINE-MENTHOL 20-0.5 % EX AERO
1.0000 | INHALATION_SPRAY | CUTANEOUS | Status: DC | PRN
  Administered 2022-06-30: 1 via TOPICAL
  Filled 2022-06-28 (×2): qty 56

## 2022-06-28 MED ORDER — PRENATAL MULTIVITAMIN CH
1.0000 | ORAL_TABLET | Freq: Every day | ORAL | Status: DC
  Administered 2022-06-29 – 2022-06-30 (×2): 1 via ORAL
  Filled 2022-06-28 (×2): qty 1

## 2022-06-28 MED ORDER — ZOLPIDEM TARTRATE 5 MG PO TABS: 5.0000 mg | ORAL_TABLET | Freq: Every evening | ORAL | Status: DC | PRN

## 2022-06-28 MED ORDER — SENNOSIDES-DOCUSATE SODIUM 8.6-50 MG PO TABS
2.0000 | ORAL_TABLET | Freq: Every day | ORAL | Status: DC
  Administered 2022-06-29 – 2022-06-30 (×2): 2 via ORAL
  Filled 2022-06-28 (×2): qty 2

## 2022-06-28 MED ORDER — FENTANYL CITRATE (PF) 100 MCG/2ML IJ SOLN
50.0000 ug | INTRAMUSCULAR | Status: DC | PRN
  Administered 2022-06-28 (×2): 100 ug via INTRAVENOUS
  Filled 2022-06-28 (×2): qty 2

## 2022-06-28 NOTE — Discharge Summary (Addendum)
Postpartum Discharge Summary     Patient Name: Roberta Lutz DOB: 1996-03-21 MRN: [id number]  Date of admission: 06/28/2022 Delivery date:06/28/2022  Delivering provider: Gerlene Fee  Date of discharge: 06/30/2022  Admitting diagnosis: DM type 2 (diabetes mellitus, type 2) ([place]) [E11.9] Encounter for induction of labor [Z34.90] Intrauterine pregnancy: [redacted]w[redacted]d     Secondary diagnosis:  Principal Problem:   DM type 2 (diabetes mellitus, type 2) ([place]) Active Problems:   Alpha thalassemia silent carrier   Cutaneous pseudolymphoma   Supervision of high-risk pregnancy   Asthma affecting pregnancy, antepartum   Encounter for induction of labor  Additional problems: n/a    Discharge diagnosis: Term Pregnancy Delivered and Type 2 DM                                              Post partum procedures: n/a Augmentation: AROM Complications: None  Hospital course: Onset of Labor With Vaginal Delivery      27 y.o. yo G2P1001 at [redacted]w[redacted]d was admitted in Active Labor on 06/28/2022. Labor course was uncomplicated; unable to get an epidural due to precipitous delivery.   Membrane Rupture Time/Date: 1:39 PM ,06/28/2022   Delivery Method:Vaginal, Spontaneous  Episiotomy: None  Lacerations:  None  Patient had a postpartum course that was uncomplicated but she was subsequently started on procardia for elevated blood pressure with known diagnosis of gHTN.  She is ambulating, tolerating a regular diet, passing flatus, and urinating well. Patient is discharged home in stable condition on 06/30/22.  Newborn Data: Birth date:06/28/2022  Birth time:1:50 PM  Gender:Female  Living status:Living  Apgars:7 ,9  Weight:2790 g   Magnesium Sulfate received: No BMZ received: No Rhophylac:No MMR:No T-DaP:Given prenatally Flu: No Transfusion:No  Physical exam  Vitals:   06/29/22 0458 06/29/22 1501 06/29/22 2314 06/30/22 0624  BP: 134/77 126/69 110/69 110/64  Pulse: 73 90 76 72  Resp: 18 18 18 18    Temp: 97.7 F (36.5 C) 98.2 F (36.8 C) 99.3 F (37.4 C) 98.9 F (37.2 C)  TempSrc: Oral Oral Oral Oral  SpO2: 100% 99% 100% 99%  Weight:      Height:       General: alert, cooperative, and no distress Lochia: appropriate Uterine Fundus: firm Incision: N/A DVT Evaluation: No evidence of DVT seen on physical exam. No significant calf/ankle edema. Labs: Lab Results  Component Value Date   WBC 13.0 (H) 06/28/2022   HGB 13.8 06/28/2022   HCT 42.2 06/28/2022   MCV 77.9 (L) 06/28/2022   PLT 365 06/28/2022      Latest Ref Rng & Units 06/26/2022   10:44 AM  CMP  Glucose 70 - 99 mg/dL 76   BUN 6 - 20 mg/dL 6   Creatinine 0.44 - 1.00 mg/dL 0.72   Sodium 135 - 145 mmol/L 135   Potassium 3.5 - 5.1 mmol/L 3.9   Chloride 98 - 111 mmol/L 105   CO2 22 - 32 mmol/L 18   Calcium 8.9 - 10.3 mg/dL 9.1   Total Protein 6.5 - 8.1 g/dL 6.7   Total Bilirubin 0.3 - 1.2 mg/dL 0.4   Alkaline Phos 38 - 126 U/L 138   AST 15 - 41 U/L 17   ALT 0 - 44 U/L 13    Edinburgh Score:    06/28/2022    3:27 PM  Edinburgh Postnatal Depression Scale Screening  Tool  I have been able to laugh and see the funny side of things. 0  I have looked forward with enjoyment to things. 1  I have blamed myself unnecessarily when things went wrong. 2  I have been anxious or worried for no good reason. 2  I have felt scared or panicky for no good reason. 0  Things have been getting on top of me. 1  I have been so unhappy that I have had difficulty sleeping. 0  I have felt sad or miserable. 1  I have been so unhappy that I have been crying. 0  The thought of harming myself has occurred to me. 0  Edinburgh Postnatal Depression Scale Total 7     After visit meds:  Allergies as of 06/30/2022   No Known Allergies      Medication List     STOP taking these medications    cyclobenzaprine 10 MG tablet Commonly known as: FLEXERIL   DICLEGIS PO   doxylamine (Sleep) 25 MG tablet Commonly known as: UNISOM    metoCLOPramide 5 MG tablet Commonly known as: Reglan   pantoprazole 20 MG tablet Commonly known as: PROTONIX       TAKE these medications    Accu-Chek Guide test strip Generic drug: glucose blood Use as instructed   Accu-Chek Softclix Lancets lancets 100 each by Other route 4 (four) times daily.   acetaminophen 325 MG tablet Commonly known as: Tylenol Take 2 tablets (650 mg total) by mouth every 4 (four) hours as needed (for pain scale < 4). What changed:  medication strength how much to take when to take this reasons to take this   Advair Diskus 100-50 MCG/ACT Aepb Generic drug: fluticasone-salmeterol Inhale 1 puff into the lungs 2 (two) times daily.   glyBURIDE 2.5 MG tablet Commonly known as: DIABETA Take 1 tablet (2.5 mg total) by mouth daily with supper.   ibuprofen 600 MG tablet Commonly known as: ADVIL Take 1 tablet (600 mg total) by mouth every 6 (six) hours.   NIFEdipine 30 MG 24 hr tablet Commonly known as: ADALAT CC Take 1 tablet (30 mg total) by mouth daily.   prenatal multivitamin Tabs tablet Take 1 tablet by mouth daily at 12 noon.         Discharge home in stable condition Infant Feeding: Bottle Infant Disposition:home with mother Discharge instruction: per After Visit Summary and Postpartum booklet. Activity: Advance as tolerated. Pelvic rest for 6 weeks.  Diet: routine diet Future Appointments: Future Appointments  Date Time Provider [place]  07/04/2022  7:15 AM [facility] SCHED ROOM [facility] None  07/05/2022 11:00 AM [place] None  08/10/2022 10:15 AM Woodroe Mode, MD [facility] None   Follow up Visit:  Message sent to [facility] by Autry-Lott on 06/30/2022  Please schedule this patient for a In person postpartum visit in 6 weeks with the following provider: MD and APP. Additional Postpartum F/U:BP check 1 week, Needs PCP follow up for diabetes and Asthma High risk pregnancy complicated by:  [id number], gHTN Delivery mode:   Vaginal, Spontaneous Vaginal Anticipated Birth Control:   Declined   06/30/2022 Ivery Nanney Autry-Lott, DO

## 2022-06-28 NOTE — Telephone Encounter (Signed)
TC from pt concerned about returning to [facility]U for labor. Pt moaning thru UC's and unable to talk. UC's Q2-3 while on the phone with pt. Bloody show has cont'd over night. Contractions consistent since 2 AM. Advised pt to return to MAU for labor eval. Emotional support provided. Pt verbalized understanding.

## 2022-06-28 NOTE — Lactation Note (Addendum)
This note was copied from a baby's chart. Lactation Consultation Note  Patient Name: Roberta Lutz [id number] Date: 06/28/2022 Age:29 hours  Mother feeding choice on admission is formula feeding. RN verified that family only plans to formula feed.     Consult Status   complete   Gwenevere Abbot 06/28/2022, 6:27 PM

## 2022-06-28 NOTE — Telephone Encounter (Signed)
Preadmission screen  

## 2022-06-28 NOTE — H&P (Addendum)
OBSTETRIC ADMISSION HISTORY AND PHYSICAL  Roberta Lutz is a 27 y.o. female G2P1001 with IUP at 55w1dby 10w [place] presenting for labor. Has been having painful contractions. Intensity of contractions picked up around 2AM. She reports +FMs, No LOF, no VB, no blurry vision, headaches or peripheral edema, and RUQ pain.  She plans on bottle feeding. She declined birth control. She received her prenatal care at [facility] [place]--->  Estimated Date of Delivery: 07/11/22  Sono:    '@[redacted]w[redacted]d'$ , CWD, normal anatomy, cephalic presentation,  [id number] 72% EFW '@[redacted]w[redacted]d'$ ,  anterior placenta, cephalic, EFW [id number]  Prenatal History/Complications:  - ghtn dx 3/13 by 2x MRBPs. 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   Rosacea     Past Surgical History: Past Surgical History:  Procedure Laterality Date   DRUG INDUCED ENDOSCOPY     Gastritis    Obstetrical History: OB History  Gravida Para Term Preterm AB Living  '2 1 1     1  '$ SAB IAB Ectopic Multiple Live Births        0 1    # Outcome Date GA Lbr Len/2nd Weight Sex Delivery Anes PTL Lv  2 Current           1 Term 04/07/20 332w1d3:20 / 00:20 2055 g M Vag-Spont EPI  LIV     Birth Comments: wnl      Social History Social History   Socioeconomic History   Marital status: Single    Spouse name: Not on file   Number of children: 1   Years of education: Not on file   Highest education level: Not on file  Occupational History   Not on file  Tobacco Use   Smoking status: Never    Passive exposure: Never   Smokeless  tobacco: Never  Vaping Use   Vaping Use: Never used  Substance and Sexual Activity   Alcohol use: Not Currently    Alcohol/week: 2.0 standard drinks of alcohol    Types: 2 Glasses of wine per week    Comment: 3-4 times per week prior to confirmed pregnancy   Drug use: No   Sexual activity: Yes    Partners: Male    Birth control/protection: None    Comment: had sexual intercourse last week  Other Topics Concern   Not on file  Social History Narrative   Not on file   Social Determinants of Health   Financial Resource Strain: Not on file  Food Insecurity: Food Insecurity Present (05/11/2022)   Hunger Vital Sign    Worried About Running Out of Food in the Last Year: Sometimes true    Ran Out of Food in the Last Year: Sometimes true  Transportation Needs: No Transportation Needs (05/11/2022)   PRAPARE - TrHydrologistMedical): No    Lack of Transportation (Non-Medical): No  Physical Activity: Not on file  Stress: Not on file  Social Connections: Not on file    Family  History: Family History  Problem Relation Age of Onset   Diabetes Mother    Arthritis Mother    Hypertension Father    Birth defects Brother    ADD / ADHD Brother    Asthma Brother    Diabetes Maternal Grandmother    Arthritis Maternal Grandmother    Cancer Maternal Grandfather    Heart disease Neg Hx    Stroke Neg Hx     Allergies: No Known Allergies  Medications Prior to Admission  Medication Sig Dispense Refill Last Dose   Accu-Chek Softclix Lancets lancets 100 each by Other route 4 (four) times daily. 100 each 12    acetaminophen (TYLENOL) 500 MG tablet Take 500 mg by mouth every 6 (six) hours as needed. (Patient not taking: Reported on 06/25/2022)      cyclobenzaprine (FLEXERIL) 10 MG tablet Take 1 tablet (10 mg total) by mouth 2 (two) times daily as needed for muscle spasms. 20 tablet 0    doxylamine, Sleep, (UNISOM) 25 MG tablet Take 25 mg by mouth at bedtime as needed.  (Patient not taking: Reported on 06/25/2022)      Doxylamine-Pyridoxine (DICLEGIS PO) Take by mouth. (Patient not taking: Reported on 05/25/2022)      fluticasone-salmeterol (ADVAIR DISKUS) 100-50 MCG/ACT AEPB Inhale 1 puff into the lungs 2 (two) times daily. (Patient not taking: Reported on 05/31/2022) 60 each 1    glucose blood (ACCU-CHEK GUIDE) test strip Use as instructed 100 each 12    glyBURIDE (DIABETA) 2.5 MG tablet Take 1 tablet (2.5 mg total) by mouth daily with supper. 30 tablet 2    metoCLOPramide (REGLAN) 5 MG tablet Take 1 tablet (5 mg total) by mouth every 6 (six) hours as needed for nausea. 60 tablet 1    pantoprazole (PROTONIX) 20 MG tablet Take 1 tablet (20 mg total) by mouth daily. (Patient not taking: Reported on 05/25/2022) 30 tablet 0    Prenatal Vit-Fe Fumarate-FA (PRENATAL MULTIVITAMIN) TABS tablet Take 1 tablet by mouth daily at 12 noon.        Review of Systems   All systems reviewed and negative except as stated in HPI  Blood pressure (!) 128/97, pulse 97, temperature 97.6 F (36.4 C), temperature source Oral, resp. rate 18, last menstrual period 09/24/2021, SpO2 100 %. General appearance: in pain, all fours in bed Lungs: normal work of breathing Heart: regular rate Presentation: cephalic Fetal monitoring: baseline 140, mod variability, - accel, + early decel. Cat 1  Toco: q 2 min   Dilation: 5.5 Effacement (%): 100 Station: -1 Exam by:: Cloretta Ned, RN   Prenatal labs: ABO, Rh: --/--/O POS (12/09 1631) Antibody: NEG (12/09 1631) Rubella: 1.58 (10/09 0906) RPR: Non Reactive (01/26 0818)  HBsAg: Negative (10/09 0906)  HIV: Non Reactive (01/26 0818)  GBS: Negative/-- (02/29 1041)  2hr GTT: 121/237/215 A1c (Dec 23) 6.4 Genetic screening  NIPS low risk Anatomy US normal  Prenatal Transfer Tool  Maternal Diabetes: Yes:  Diabetes Type:  Pre-pregnancy Genetic Screening: Normal Maternal Ultrasounds/Referrals: Normal Fetal Ultrasounds or other  Referrals:  None Maternal Substance Abuse:  No Significant Maternal Medications:  Meds include: Other: glyburide Significant Maternal Lab Results:  None Number of Prenatal Visits:greater than 3 verified prenatal visits Other Comments:  None  No results found for this or any previous visit (from the past 24 hour(s)).  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 G2P1001 at 90w1dhere for labor.  #T2DM - last A1c 3 months ago was 6.4 - on glyburide - q4hr latent phase, q2hr active phase, hourly second stage - goal <120   #gHTN - diagnosed by MRBP on 3/12, 3/13 - Serum labs wnl on 3/12, Cr 0.72, LFT wnl, UPC 0.26 - continue to monitor - judicious use of methergine  #Labor: expectant management #Pain: Family / friends support, IV pain meds --> epidural #FWB: Continuous monitoring. Cat 1 tracing.  #ID:  GBS neg #MOF: bottle #MOC: declined #Circ:  N/A  Patient seen with Dr. SNaaman PlummerAutry-Lott. Jiayu "KDarlyne Russian M.D. PGY-2 Family Medicine Visiting Resident Faculty Practice 06/28/2022 12:31 PM   GME ATTESTATION:  I saw and evaluated the patient. I agree with the findings and the plan of care as documented in the resident's note. I have made changes to documentation as necessary.  SGerlene Fee DO OB Fellow, FPoquosonfor WLuverne3/14/2024, 12:52 PM

## 2022-06-28 NOTE — Anesthesia Preprocedure Evaluation (Deleted)
Anesthesia Evaluation  Patient identified by MRN, date of birth, ID band Patient awake    Reviewed: Allergy & Precautions, NPO status , Patient's Chart, lab work & pertinent test results  Airway Mallampati: II  TM Distance: >3 FB Neck ROM: Full    Dental no notable dental hx.    Pulmonary asthma    Pulmonary exam normal        Cardiovascular hypertension,  Rhythm:Regular Rate:Normal     Neuro/Psych  Headaches  Anxiety        GI/Hepatic Neg liver ROS,GERD  Medicated,,  Endo/Other  diabetes, Type 2    Renal/GU negative Renal ROS  negative genitourinary   Musculoskeletal negative musculoskeletal ROS (+)    Abdominal Normal abdominal exam  (+)   Peds  Hematology negative hematology ROS (+)   Anesthesia Other Findings   Reproductive/Obstetrics (+) Pregnancy                             Anesthesia Physical Anesthesia Plan  ASA: 2  Anesthesia Plan: Epidural   Post-op Pain Management:    Induction:   PONV Risk Score and Plan: 2 and Treatment may vary due to age or medical condition  Airway Management Planned: Natural Airway  Additional Equipment: None  Intra-op Plan:   Post-operative Plan:   Informed Consent: I have reviewed the patients History and Physical, chart, labs and discussed the procedure including the risks, benefits and alternatives for the proposed anesthesia with the patient or authorized representative who has indicated his/her understanding and acceptance.     Dental advisory given  Plan Discussed with:   Anesthesia Plan Comments:        Anesthesia Quick Evaluation

## 2022-06-28 NOTE — Telephone Encounter (Signed)
Ucs q 4 minutes breathing with them and crying with them.  Instructed to come to the hospital for evaluation.

## 2022-06-28 NOTE — Inpatient Diabetes Management (Signed)
Inpatient Diabetes Program Recommendations  AACE/ADA: New Consensus Statement on Inpatient Glycemic Control (2015)  Target Ranges:  Prepandial:   less than 140 mg/dL      Peak postprandial:   less than 180 mg/dL (1-2 hours)      Critically ill patients:  140 - 180 mg/dL   Lab Results  Component Value Date   GLUCAP 123 (H) 05/17/2022   HGBA1C 6.4 (H) 03/25/2022    Review of Glycemic Control  Diabetes history: DM2 Outpatient Diabetes medications: Glyburide 2.5 mg QD Current orders for Inpatient glycemic control: none  Inpatient Diabetes Program Recommendations:    Referral received for DM management evaluation.  Please consider:  CBG's BID-if elevated increase to Q4H.  Will continue to follow while inpatient.  Thank you, Reche Dixon, MSN, [place] Diabetes Coordinator Inpatient Diabetes Program [phone] (team pager from 8a-5p)

## 2022-06-29 ENCOUNTER — Ambulatory Visit: Payer: Medicaid Other

## 2022-06-29 ENCOUNTER — Other Ambulatory Visit (HOSPITAL_COMMUNITY): Payer: Self-pay | Admitting: Advanced Practice Midwife

## 2022-06-29 LAB — RPR: RPR Ser Ql: NONREACTIVE

## 2022-06-29 LAB — GLUCOSE, CAPILLARY: Glucose-Capillary: 122 mg/dL — ABNORMAL HIGH (ref 70–99)

## 2022-06-29 MED ORDER — NIFEDIPINE ER OSMOTIC RELEASE 30 MG PO TB24
30.0000 mg | ORAL_TABLET | Freq: Every day | ORAL | Status: DC
  Administered 2022-06-29 – 2022-06-30 (×2): 30 mg via ORAL
  Filled 2022-06-29 (×2): qty 1

## 2022-06-29 NOTE — Progress Notes (Signed)
Instructed patient to call when she gets her meals. Patient stated she hasn't ordered any meals yet. RN reminded patient to call for CBG check 2 hours after meals. Patient verbalized understanding.

## 2022-06-29 NOTE — Social Work (Signed)
CSW received consult for hx of Anxiety and food insecurity.  CSW met with [name] to offer support and complete assessment. CSW entered the room and observed [name] walking about in room. CSW introduced self, CSW role and reason for visit. [name] was agreeable to visit. CSW inquired about how [name] was feeling, [name] reported good. CSW inquired about [name] MH hx. [name] reported she has never been officially diagnosed with anxiety but experiences symptoms at times. CSW inquired about treatment,[name] reported she does not take any medication nor is she in therapy at this time. CSW inquired about how [name] copes, [name] reported she takes a break to clear her mind. CSW assessed for safety, [name] denied any SI, HI or DV. CSW provided education regarding the baby blues period vs. perinatal mood disorders, discussed treatment and gave resources for mental health follow up if concerns arise.  CSW recommends self-evaluation during the postpartum time period using the New Mom Checklist from Postpartum Progress and encouraged [name] to contact a medical professional if symptoms are noted at any time. [name] reported she did experience some PPD after her son was born. [name] reported she was crying a lot ans unmotivated. [name] reported the lasted on an off for a couple of months after he was born. [name] reports a stable mood currently. [name] identified FOB, FOB,'s Family, her grandmother and mom as her supports.   CSW inquired about noted food insecurity, [name] reported it has not been an issue, [name] reported she receives [facility] and plans to reapply for food stamps.    CSW provided review of Sudden Infant Death Syndrome (SIDS) precautions.  [name] reported she has all necessary items for the infant including a pack n play and car seat. CSW identifies no further need for intervention and no barriers to discharge at this time.  Roberta Lutz, [place] Social Worker [phone]

## 2022-06-29 NOTE — Progress Notes (Signed)
[name]: Patient doing well without complaints. Denies any HA, visual changes or epigastric pain.  Objective: Blood pressure 134/77, pulse 73, temperature 97.7 F (36.5 C), temperature source Oral, resp. rate 18, height 5\' 3"  (1.6 m), weight 102 kg, last menstrual period 09/24/2021, SpO2 100 %, unknown if currently breastfeeding.  Physical Exam:  General: alert, cooperative, and no distress Lochia: appropriate Uterine Fundus: firm Incision: n/a DVT Evaluation: No evidence of DVT seen on physical exam.  Recent Labs    06/28/22 1210  HGB 13.8  HCT 42.2    Assessment/Plan:  -Doing well. Working on feedings with baby. Discussed normalcy of quieter baby in first 24 hours and discussed cluster feeding on night 2 -BPs consitently elevated since delivery. Patient with hx of preeclampsia in previous pregnancy. Will start procardia -Diabetes management by inpatient team.     LOS: 1 day   Roberta Lutz, CNM 06/29/2022, 11:31 AM

## 2022-06-29 NOTE — Inpatient Diabetes Management (Signed)
Inpatient Diabetes Program Recommendations  AACE/ADA: New Consensus Statement on Inpatient Glycemic Control (2015)  Target Ranges:  Prepandial:   less than 140 mg/dL      Peak postprandial:   less than 180 mg/dL (1-2 hours)      Critically ill patients:  140 - 180 mg/dL   Lab Results  Component Value Date   GLUCAP 90 06/28/2022   HGBA1C 6.4 (H) 03/25/2022    Review of Glycemic Control  Latest Reference Range & Units 06/28/22 23:38  Glucose-Capillary 70 - 99 mg/dL 90   Diabetes history: DM2 Outpatient Diabetes medications: Glyburide 2.5 mg QD Current orders for Inpatient glycemic control: Glyburide 2.5 mg QD   Inpatient Diabetes Program Recommendations:    Consider:   Changing CBGs to TID & HS And changing to diet to carb mod.   Will continue to follow while inpatient.   Thanks, Bronson Curb, MSN, RNC-OB Diabetes Coordinator [phone] (8a-5p)

## 2022-06-30 DIAGNOSIS — Z3490 Encounter for supervision of normal pregnancy, unspecified, unspecified trimester: Secondary | ICD-10-CM | POA: Diagnosis present

## 2022-06-30 LAB — GLUCOSE, CAPILLARY: Glucose-Capillary: 92 mg/dL (ref 70–99)

## 2022-06-30 MED ORDER — ACETAMINOPHEN 325 MG PO TABS: 650.0000 mg | ORAL_TABLET | ORAL | 0 refills | Status: AC | PRN

## 2022-06-30 MED ORDER — IBUPROFEN 600 MG PO TABS: 600.0000 mg | ORAL_TABLET | Freq: Four times a day (QID) | ORAL | 0 refills | Status: AC

## 2022-06-30 MED ORDER — NIFEDIPINE ER 30 MG PO TB24: 30.0000 mg | ORAL_TABLET | Freq: Every day | ORAL | 0 refills | Status: AC

## 2022-07-04 ENCOUNTER — Inpatient Hospital Stay (HOSPITAL_COMMUNITY): Payer: Medicaid Other

## 2022-07-04 ENCOUNTER — Inpatient Hospital Stay (HOSPITAL_COMMUNITY): Admission: RE | Admit: 2022-07-04 | Payer: Medicaid Other | Source: Home / Self Care | Admitting: Family Medicine

## 2022-07-05 ENCOUNTER — Ambulatory Visit (INDEPENDENT_AMBULATORY_CARE_PROVIDER_SITE_OTHER): Payer: Medicaid Other

## 2022-07-05 DIAGNOSIS — Z0130 Encounter for examination of blood pressure without abnormal findings: Secondary | ICD-10-CM

## 2022-07-05 NOTE — Progress Notes (Signed)
Subjective:  Roberta Lutz is a 27 y.o. female here for BP check.   Hypertension ROS: taking medications as instructed, no medication side effects noted, no TIA's, no chest pain on exertion, no dyspnea on exertion, and no swelling of ankles.    Objective:  LMP 09/24/2021   Appearance alert, well appearing, and in no distress. General exam BP noted to be well controlled today in office.    Assessment:   Blood Pressure well controlled.   Plan:  Current treatment plan is effective, no change in therapy.

## 2022-07-16 DIAGNOSIS — Z419 Encounter for procedure for purposes other than remedying health state, unspecified: Secondary | ICD-10-CM | POA: Diagnosis not present

## 2022-08-10 ENCOUNTER — Ambulatory Visit (INDEPENDENT_AMBULATORY_CARE_PROVIDER_SITE_OTHER): Payer: Medicaid Other | Admitting: Obstetrics & Gynecology

## 2022-08-10 NOTE — Progress Notes (Signed)
    Post Partum Visit Note  Roberta Lutz is a 27 y.o. [id number] female who presents for a postpartum visit. She is 6 weeks postpartum following a normal spontaneous vaginal delivery.  I have fully reviewed the prenatal and intrapartum course. The delivery was at 38.1 gestational weeks.  Anesthesia:  Nitrous . Postpartum course has been good. Baby is doing well. Baby is feeding by bottle - Similac Gentle . Bleeding no bleeding. Bowel function is normal. Bladder function is normal. Patient is not sexually active. Contraception method is none.  Postpartum depression screening: negative, score 3.  Pt states she is having a lot of pelvic pain, worsening since delivery.  Pt states it is hard for her to get up from sitting position or from floor.     The pregnancy intention screening data noted above was reviewed. Potential methods of contraception were discussed. The patient elected to proceed with No data recorded.    Health Maintenance Due  Topic Date Due   COVID-19 Vaccine (1) Never done   FOOT EXAM  Never done   OPHTHALMOLOGY EXAM  Never done    The following portions of the patient's history were reviewed and updated as appropriate: allergies, current medications, past family history, past medical history, past social history, past surgical history, and problem list.  Review of Systems Musculoskeletal:positive for back pain and pelvic pain  Objective:  LMP 09/24/2021    General:  alert, cooperative, and no distress   Breasts:  not indicated  Lungs:   Heart:  regular rate and rhythm  Abdomen: soft, non-tender; bowel sounds normal; no masses,  no organomegaly   Wound   GU exam:  not indicated       Assessment:    There are no diagnoses linked to this encounter.  6 week postpartum exam. Back and pelvic pain  Plan:   Essential components of care per ACOG recommendations:  1.  Mood and well being: Patient with negative depression screening today. Reviewed local resources for  support.  - Patient tobacco use? No.   - hx of drug use? No.    2. Infant care and feeding:  -Patient currently breastmilk feeding? No.  -Social determinants of health (SDOH) reviewed in EPIC. No concerns  3. Sexuality, contraception and birth spacing - Patient does not want a pregnancy in the next year.  Desired family size is 3 children.  - Reviewed reproductive life planning. Reviewed contraceptive methods based on pt preferences and effectiveness.  Patient desired Abstinence today.   - Discussed birth spacing of 18 months  4. Sleep and fatigue -Encouraged family/partner/community support of 4 hrs of uninterrupted sleep to help with mood and fatigue  5. Physical Recovery  - Discussed patients delivery and complications. She describes her labor as mixed. - Patient had a Vaginal, no problems at delivery. Patient had no laceration. Perineal healing reviewed. Patient expressed understanding - Patient has urinary incontinence? No. - Patient is safe to resume physical and sexual activity  6.  Health Maintenance - HM due items addressed Yes - Last pap smear  Diagnosis  Date Value Ref Range Status  10/26/2019   Final   - Negative for intraepithelial lesion or malignancy (NILM)   Pap smear not done at today's visit.  -Breast Cancer screening indicated? no  7. Chronic Disease/Pregnancy Condition follow up:  back pain f/u PT  - PCP follow up Adam Phenix, MD  [facility], [facility]

## 2022-08-15 DIAGNOSIS — Z419 Encounter for procedure for purposes other than remedying health state, unspecified: Secondary | ICD-10-CM | POA: Diagnosis not present

## 2022-09-15 DIAGNOSIS — Z419 Encounter for procedure for purposes other than remedying health state, unspecified: Secondary | ICD-10-CM | POA: Diagnosis not present

## 2022-10-15 DIAGNOSIS — Z419 Encounter for procedure for purposes other than remedying health state, unspecified: Secondary | ICD-10-CM | POA: Diagnosis not present

## 2024-05-04 ENCOUNTER — Ambulatory Visit: Payer: Self-pay | Admitting: Obstetrics and Gynecology
